# Patient Record
Sex: Male | Born: 1940 | Hispanic: No | Smoking: Current every day smoker
Health system: Southern US, Community
[De-identification: ages and names within clinical notes are randomized; demographics above are authoritative.]

## PROBLEM LIST (undated history)

## (undated) DIAGNOSIS — I10 Essential (primary) hypertension: Secondary | ICD-10-CM

## (undated) DIAGNOSIS — N529 Male erectile dysfunction, unspecified: Secondary | ICD-10-CM

## (undated) HISTORY — PX: COLONOSCOPY: SHX174

## (undated) HISTORY — PX: EYE SURGERY: SHX253

## (undated) HISTORY — DX: Male erectile dysfunction, unspecified: N52.9

## (undated) HISTORY — PX: LUMBAR DISC SURGERY: SHX700

## (undated) HISTORY — DX: Essential (primary) hypertension: I10

---

## 2005-10-22 ENCOUNTER — Ambulatory Visit: Payer: Self-pay | Admitting: Internal Medicine

## 2007-01-28 ENCOUNTER — Ambulatory Visit: Payer: Self-pay | Admitting: Internal Medicine

## 2007-01-28 LAB — CONVERTED CEMR LAB
Albumin: 4.3 g/dL (ref 3.5–5.2)
Alkaline Phosphatase: 58 units/L (ref 39–117)
Bilirubin Urine: NEGATIVE
Creatinine, Ser: 1.1 mg/dL (ref 0.4–1.5)
Eosinophils Absolute: 0.2 10*3/uL (ref 0.0–0.6)
GFR calc Af Amer: 86 mL/min
Glucose, Bld: 115 mg/dL — ABNORMAL HIGH (ref 70–99)
HDL: 32.2 mg/dL — ABNORMAL LOW (ref >39.0)
Hemoglobin: 15.5 g/dL (ref 13.0–17.0)
Hgb A1c MFr Bld: 7.4 % — ABNORMAL HIGH (ref 4.6–6.0)
Leukocytes, UA: NEGATIVE
Lymphocytes Relative: 38.2 % (ref 12.0–46.0)
Monocytes Absolute: 0.5 10*3/uL (ref 0.2–0.7)
Monocytes Relative: 4.3 % (ref 3.0–11.0)
Neutro Abs: 6.7 10*3/uL (ref 1.4–7.7)
Platelets: 270 10*3/uL (ref 150–400)
Potassium: 4.4 meq/L (ref 3.5–5.1)
TSH: 1.94 microintl units/mL (ref 0.35–5.50)
Total Bilirubin: 0.9 mg/dL (ref 0.3–1.2)
Total Protein, Urine: NEGATIVE mg/dL
Total Protein: 7.2 g/dL (ref 6.0–8.3)
VLDL: 48 mg/dL — ABNORMAL HIGH (ref 0–40)
WBC: 12.1 10*3/uL — ABNORMAL HIGH (ref 4.5–10.5)
pH: 6 (ref 5.0–8.0)

## 2007-01-28 IMAGING — CR DG CHEST 2V
2 series · 2 of 2 positions shown · non-contrast
Comparison: Chest of [DATE].

CLINICAL DATA: Syncope.  
 CHEST - 2 VIEW:

[view not recorded (1 of 2)]
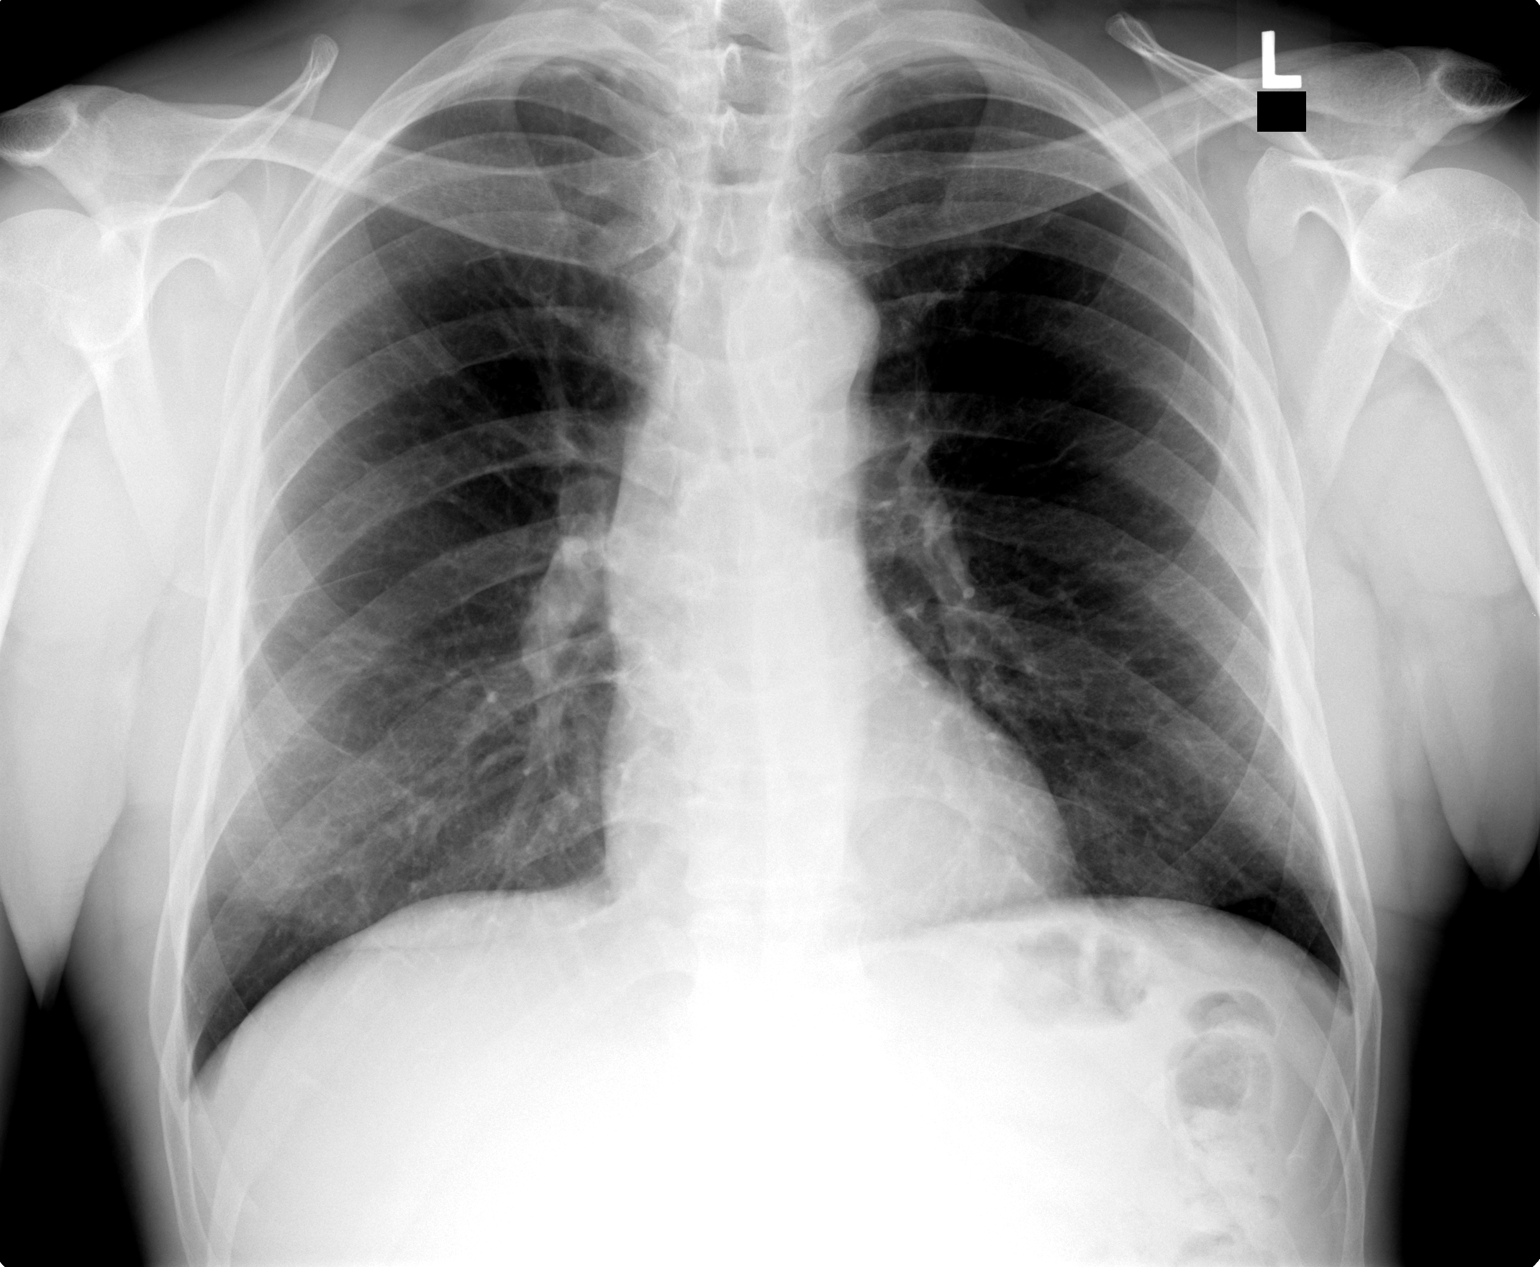

[view not recorded (2 of 2)]
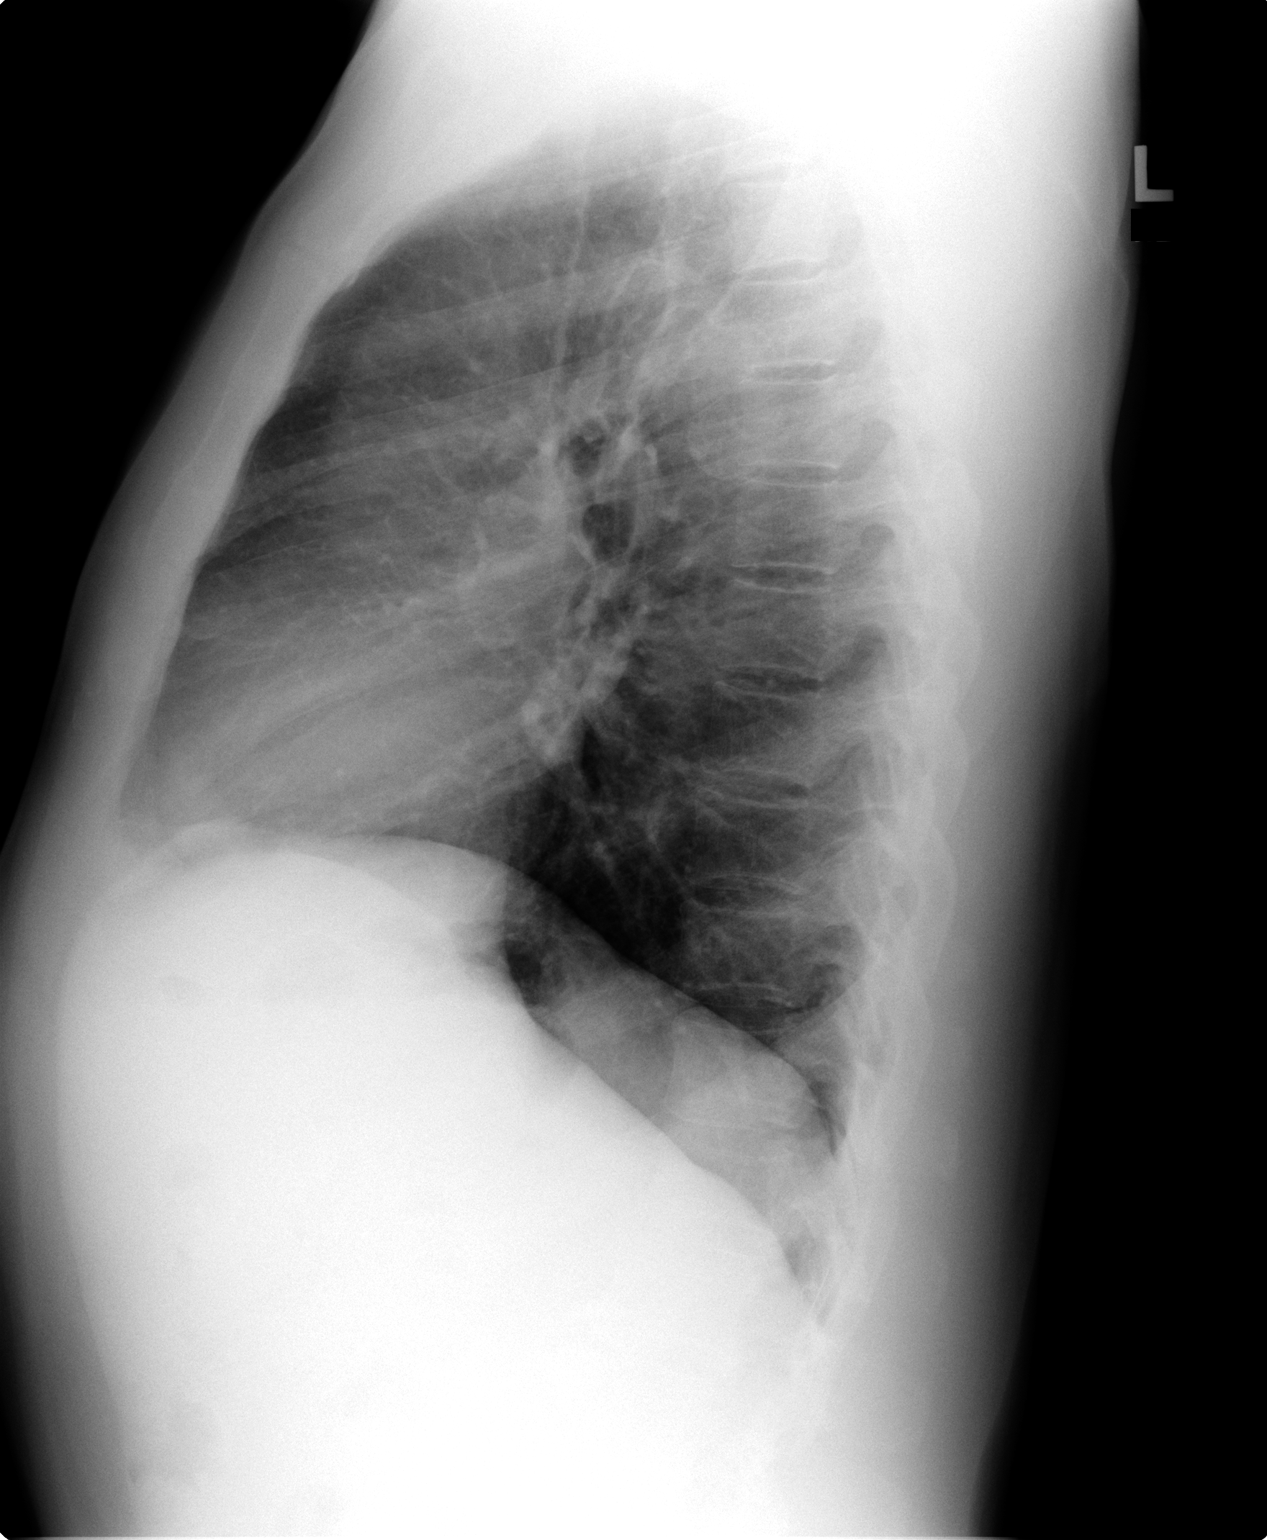

[2 of 2 positions shown; findings below may reference images not displayed]

FINDINGS: Two views of the chest show the lungs to be clear.  The heart is within normal limits in size.  No bony abnormality is seen.
IMPRESSION: No active lung disease.

## 2007-01-29 ENCOUNTER — Encounter: Payer: Self-pay | Admitting: Internal Medicine

## 2007-01-29 LAB — CONVERTED CEMR LAB: Vit D, 1,25-Dihydroxy: 22 (ref 20–57)

## 2007-02-23 ENCOUNTER — Ambulatory Visit: Payer: Self-pay | Admitting: Cardiovascular Disease

## 2007-03-10 ENCOUNTER — Ambulatory Visit: Payer: Self-pay

## 2007-03-10 ENCOUNTER — Encounter: Payer: Self-pay | Admitting: Cardiovascular Disease

## 2007-03-12 ENCOUNTER — Ambulatory Visit: Payer: Self-pay | Admitting: Internal Medicine

## 2007-03-26 ENCOUNTER — Ambulatory Visit: Payer: Self-pay | Admitting: Internal Medicine

## 2007-03-26 ENCOUNTER — Encounter: Payer: Self-pay | Admitting: Internal Medicine

## 2007-05-08 ENCOUNTER — Encounter: Payer: Self-pay | Admitting: Internal Medicine

## 2007-05-08 DIAGNOSIS — M109 Gout, unspecified: Secondary | ICD-10-CM

## 2007-05-15 ENCOUNTER — Ambulatory Visit: Payer: Self-pay | Admitting: Internal Medicine

## 2008-01-29 ENCOUNTER — Ambulatory Visit: Payer: Self-pay | Admitting: Internal Medicine

## 2008-01-29 DIAGNOSIS — F17200 Nicotine dependence, unspecified, uncomplicated: Secondary | ICD-10-CM | POA: Insufficient documentation

## 2008-01-29 DIAGNOSIS — E559 Vitamin D deficiency, unspecified: Secondary | ICD-10-CM

## 2008-01-29 DIAGNOSIS — R21 Rash and other nonspecific skin eruption: Secondary | ICD-10-CM

## 2008-01-29 DIAGNOSIS — N529 Male erectile dysfunction, unspecified: Secondary | ICD-10-CM | POA: Insufficient documentation

## 2008-08-15 ENCOUNTER — Ambulatory Visit: Payer: Self-pay | Admitting: Internal Medicine

## 2008-08-15 DIAGNOSIS — I10 Essential (primary) hypertension: Secondary | ICD-10-CM

## 2010-02-08 ENCOUNTER — Ambulatory Visit: Payer: Self-pay | Admitting: Internal Medicine

## 2010-02-08 DIAGNOSIS — R059 Cough, unspecified: Secondary | ICD-10-CM | POA: Insufficient documentation

## 2010-02-08 DIAGNOSIS — R05 Cough: Secondary | ICD-10-CM

## 2010-02-08 IMAGING — CR DG CHEST 2V
2 series · 2 of 2 positions shown · non-contrast
Comparison: Chest x-ray of [DATE]

CLINICAL DATA: Cough, smoking history

CHEST - 2 VIEW

[view not recorded (1 of 2)]
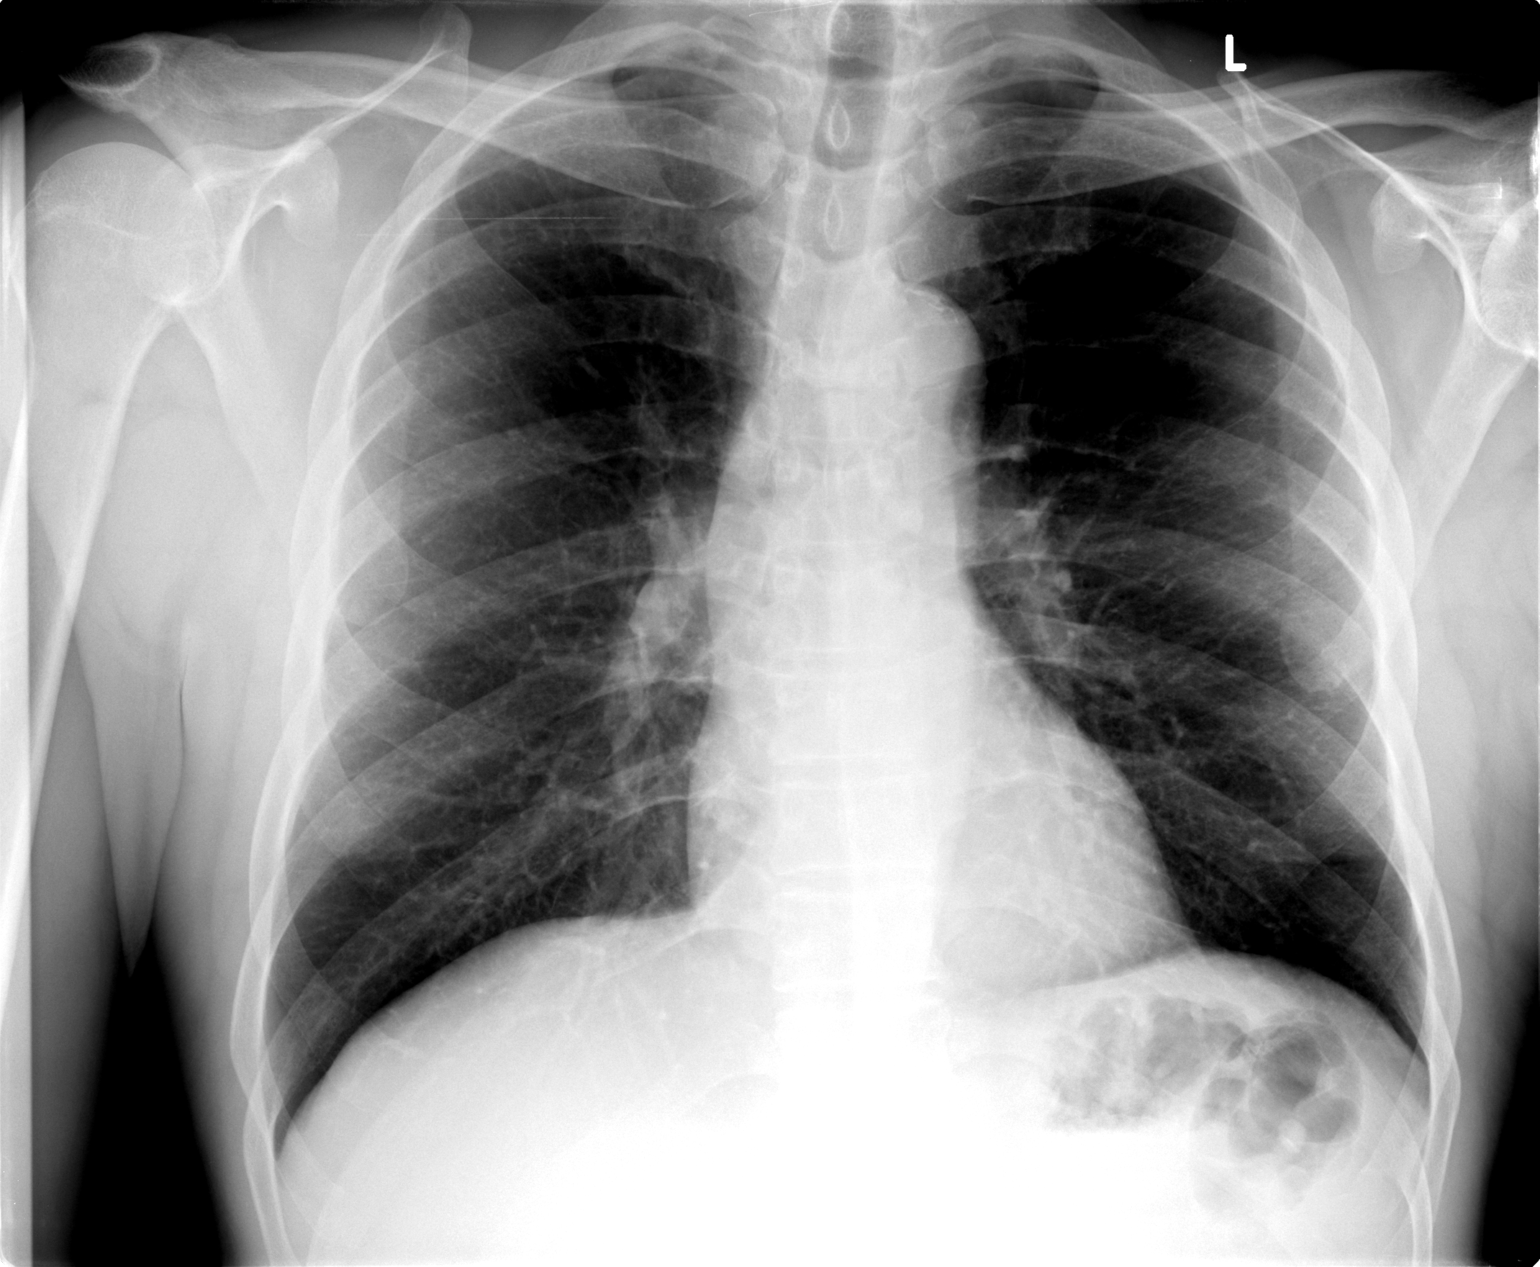

[view not recorded (2 of 2)]
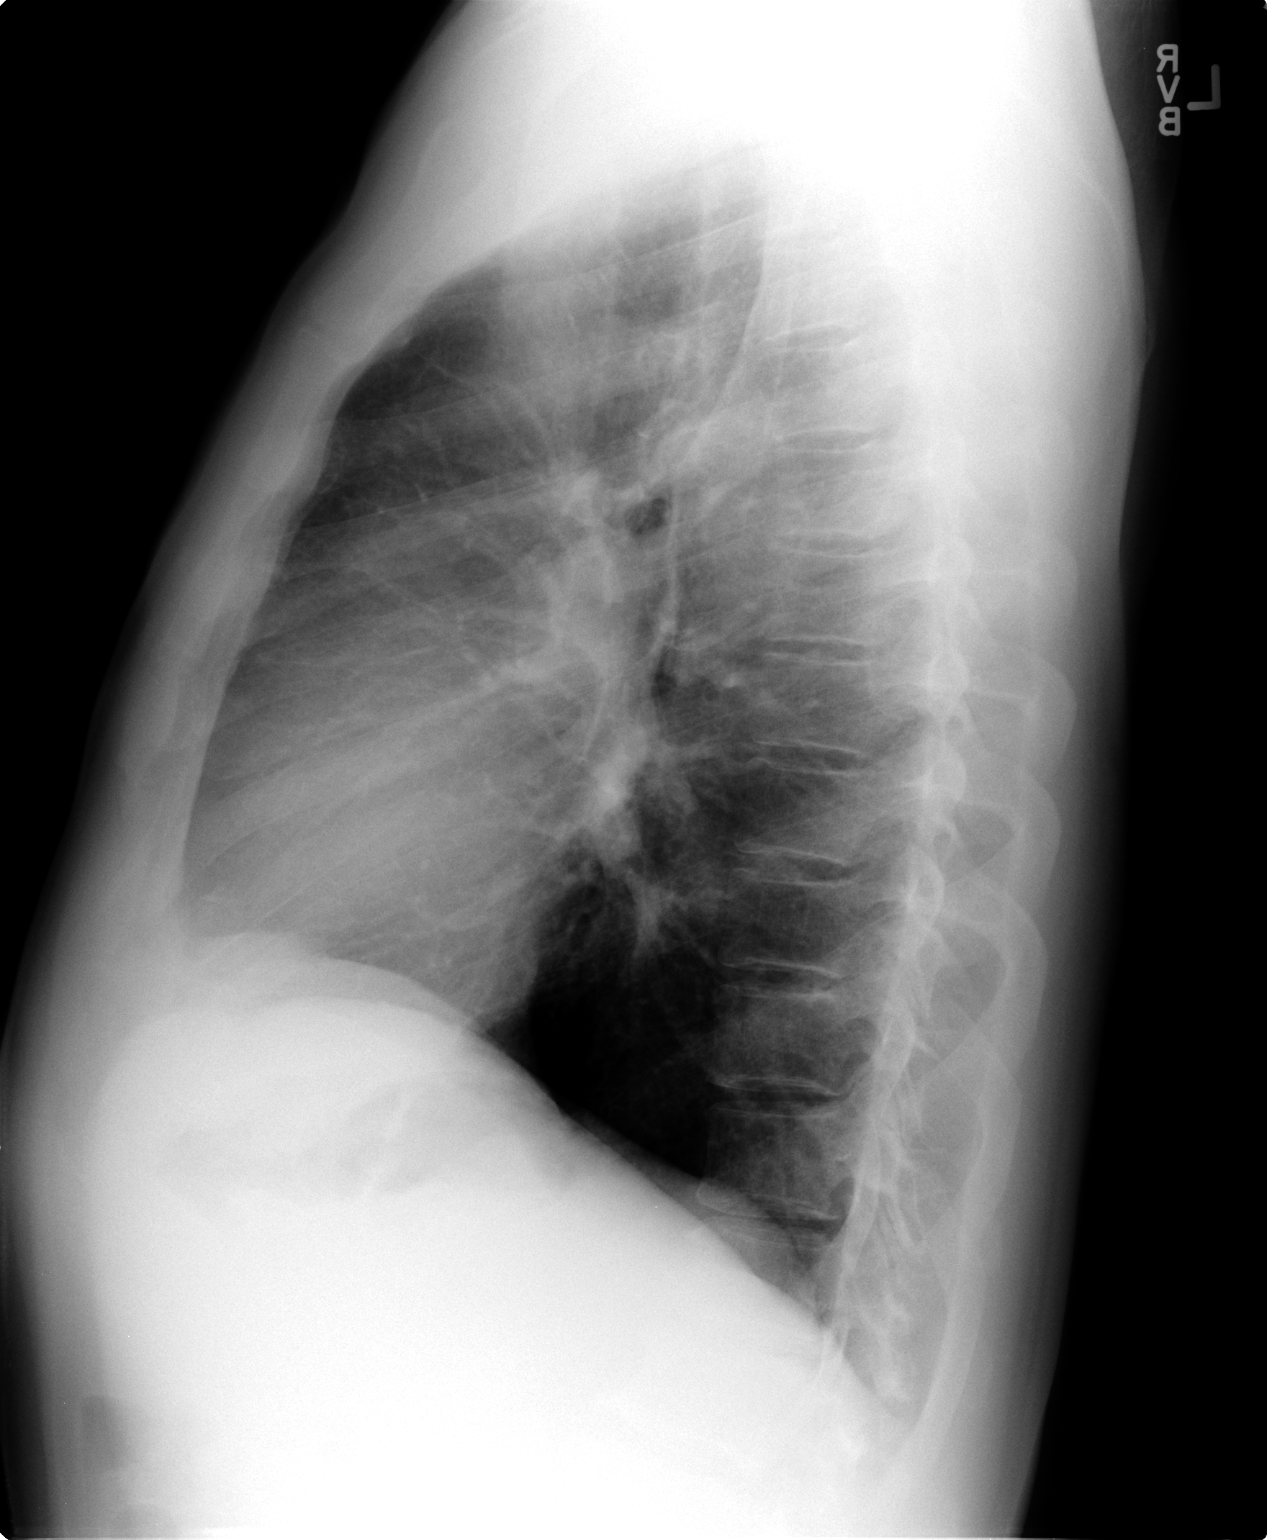

[2 of 2 positions shown; findings below may reference images not displayed]

FINDINGS: The lungs remain clear.  Mediastinal contours are
stable.  The heart is within normal limits in size.  No bony
abnormality is seen.
IMPRESSION: No active lung disease.

## 2010-07-31 ENCOUNTER — Ambulatory Visit: Payer: Self-pay | Admitting: Internal Medicine

## 2010-09-30 LAB — CONVERTED CEMR LAB
AST: 18 units/L (ref 0–37)
Albumin: 3.9 g/dL (ref 3.5–5.2)
Alkaline Phosphatase: 56 units/L (ref 39–117)
Alkaline Phosphatase: 65 units/L (ref 39–117)
BUN: 13 mg/dL (ref 6–23)
Basophils Absolute: 0 10*3/uL (ref 0.0–0.1)
Basophils Relative: 0 % (ref 0.0–3.0)
Bilirubin Urine: NEGATIVE
Bilirubin, Direct: 0.1 mg/dL (ref 0.0–0.3)
CO2: 29 meq/L (ref 19–32)
Calcium: 9.4 mg/dL (ref 8.4–10.5)
Calcium: 9.7 mg/dL (ref 8.4–10.5)
Chloride: 104 meq/L (ref 96–112)
Chloride: 105 meq/L (ref 96–112)
Creatinine, Ser: 1.1 mg/dL (ref 0.4–1.5)
Eosinophils Absolute: 0.2 10*3/uL (ref 0.0–0.7)
Eosinophils Relative: 1.6 % (ref 0.0–5.0)
Eosinophils Relative: 2.9 % (ref 0.0–5.0)
GFR calc Af Amer: 86 mL/min
GFR calc non Af Amer: 90.19 mL/min (ref >60)
Hemoglobin: 15.1 g/dL (ref 13.0–17.0)
Ketones, ur: NEGATIVE mg/dL
Ketones, ur: NEGATIVE mg/dL
Lymphocytes Relative: 33.9 % (ref 12.0–46.0)
Lymphs Abs: 3.9 10*3/uL (ref 0.7–4.0)
MCV: 98.2 fL (ref 78.0–100.0)
Monocytes Absolute: 0.4 10*3/uL (ref 0.1–1.0)
Monocytes Absolute: 0.4 10*3/uL (ref 0.1–1.0)
Monocytes Relative: 3.9 % (ref 3.0–12.0)
Monocytes Relative: 4.6 % (ref 3.0–12.0)
Neutro Abs: 6.1 10*3/uL (ref 1.4–7.7)
Neutrophils Relative %: 51.7 % (ref 43.0–77.0)
Potassium: 4.9 meq/L (ref 3.5–5.1)
RBC: 4.28 M/uL (ref 4.22–5.81)
RBC: 4.45 M/uL (ref 4.22–5.81)
RDW: 12.8 % (ref 11.5–14.6)
RDW: 14.4 % (ref 11.5–14.6)
Sodium: 143 meq/L (ref 135–145)
Specific Gravity, Urine: 1.02 (ref 1.000–1.03)
Specific Gravity, Urine: <=1.005 (ref 1.000–1.030)
Total Bilirubin: 0.6 mg/dL (ref 0.3–1.2)
Total CHOL/HDL Ratio: 5.2
Total Protein, Urine: NEGATIVE mg/dL
Total Protein, Urine: NEGATIVE mg/dL
Total Protein: 7.1 g/dL (ref 6.0–8.3)
Uric Acid, Serum: 6.6 mg/dL (ref 4.0–7.8)
Uric Acid, Serum: 8.7 mg/dL — ABNORMAL HIGH (ref 4.0–7.8)
Urine Glucose: NEGATIVE mg/dL
Urine Glucose: NEGATIVE mg/dL
Urobilinogen, UA: 0.2 (ref 0.0–1.0)
Urobilinogen, UA: 0.2 (ref 0.0–1.0)
WBC: 10.2 10*3/uL (ref 4.5–10.5)
WBC: 9.5 10*3/uL (ref 4.5–10.5)
pH: 6 (ref 5.0–8.0)

## 2010-10-02 NOTE — Assessment & Plan Note (Signed)
Summary: cpx/medicare/#/cd   Vital Signs:  Patient profile:   70 year old male Height:      72 inches Weight:      167.25 pounds BMI:     22.77 O2 Sat:      97 % on Room air Temp:     98.4 degrees F oral Pulse rate:   73 / minute BP sitting:   130 / 80  (left arm) Cuff size:   regular  Vitals Entered By: Lucious Groves (February 08, 2010 11:02 AM)  O2 Flow:  Room air CC: CPX./kb Is Patient Diabetic? No Pain Assessment Patient in pain? no      Comments Patient notes that all meds need refill./kb   CC:  CPX./kb.  History of Present Illness: The patient presents for a wellness examination  Patient past medical history, social history, and family history reviewed in detail no significant changes.  Patient is physically active. Depression is negative and mood is good. Hearing is normal, and able to perform activities of daily living. Risk of falling is negligible and home safety has been reviewed and is appropriate. Patient has normal height, weight, and visual acuity. Patient has been counseled on age-appropriate routine health concerns for screening and prevention. Education, counseling,done. F/u HTN, ED, gout. Gout has been flaring up after beer drinking. BP has been OK - he has been out of his meds x weeks. ED has been a problem. Wife has been ill a lot.  Current Medications (verified): 1)  Indomethacin Cr 75 Mg  Cpcr (Indomethacin) .Marland Kitchen.. 1 Po Bid As Needed 2)  Triamcinolone Acetonide 0.5 % Crea (Triamcinolone Acetonide) .... Apply Bid To Affected Area 3)  Colchicine 0.6 Mg Tabs (Colchicine) .Marland Kitchen.. 1 By Mouth Qid As Needed Gout 4)  Amlodipine Besylate 5 Mg  Tabs (Amlodipine Besylate) .Marland Kitchen.. 1 By Mouth Every Day 5)  Vitamin D3 1000 Unit  Tabs (Cholecalciferol) .Marland Kitchen.. 1 By Mouth Daily 6)  Viagra 100 Mg Tabs (Sildenafil Citrate) .Marland Kitchen.. 1 By Mouth Once Daily Prn  Allergies (verified): No Known Drug Allergies  Past History:  Family History: Last updated: 01/29/2008 No CAD  Social  History: Last updated: 08/15/2008 Occupation:hotel maint part time Married wife Doris Current Smoker  Past Medical History: Reviewed history from 08/15/2008 and no changes required. Gout Vit D def ED Hypertension  Past Surgical History: Denies surgical history  Review of Systems  The patient denies anorexia, fever, weight loss, weight gain, vision loss, decreased hearing, hoarseness, chest pain, syncope, dyspnea on exertion, peripheral edema, prolonged cough, headaches, hemoptysis, abdominal pain, melena, hematochezia, severe indigestion/heartburn, hematuria, incontinence, genital sores, muscle weakness, suspicious skin lesions, transient blindness, difficulty walking, depression, unusual weight change, abnormal bleeding, enlarged lymph nodes, angioedema, and breast masses.    Physical Exam  General:  Well-developed,well-nourished,in no acute distress; alert,appropriate and cooperative throughout examination Head:  Normocephalic and atraumatic without obvious abnormalities. No apparent alopecia or balding. Eyes:  No corneal or conjunctival inflammation noted. EOMI. Perrla. Ears:  External ear exam shows no significant lesions or deformities.  Otoscopic examination reveals clear canals, tympanic membranes are intact bilaterally without bulging, retraction, inflammation or discharge. Hearing is grossly normal bilaterally. Nose:  External nasal examination shows no deformity or inflammation. Nasal mucosa are pink and moist without lesions or exudates. Mouth:  Oral mucosa and oropharynx without lesions or exudates.  Teeth in poor repair. Neck:  No deformities, masses, or tenderness noted. Lungs:  Normal respiratory effort, chest expands symmetrically. Lungs are clear to auscultation, no crackles  or wheezes. Heart:  Normal rate and regular rhythm. S1 and S2 normal without gallop, murmur, click, rub or other extra sounds. Abdomen:  Bowel sounds positive,abdomen soft and non-tender without  masses, organomegaly or hernias noted. Rectal:  No external abnormalities noted. Normal sphincter tone. No rectal masses or tenderness. G(-) Genitalia:  WNL Prostate:  no nodules and 1+ enlarged.   Msk:  No deformity or scoliosis noted of thoracic or lumbar spine.   Pulses:  R and L carotid,radial,femoral,dorsalis pedis and posterior tibial pulses are full and equal bilaterally Extremities:  No clubbing, cyanosis, edema, or deformity noted with normal full range of motion of all joints.   Neurologic:  No cranial nerve deficits noted. Station and gait are normal. Plantar reflexes are down-going bilaterally. DTRs are symmetrical throughout. Sensory, motor and coordinative functions appear intact. Skin:  Intact without suspicious lesions or rashes Cervical Nodes:  No lymphadenopathy noted Inguinal Nodes:  No significant adenopathy Psych:  Cognition and judgment appear intact. Alert and cooperative with normal attention span and concentration. No apparent delusions, illusions, hallucinations   Impression & Recommendations:  Problem # 1:  WELL ADULT EXAM (ICD-V70.0) Assessment New Overall doing well, age appropriate education and counseling updated and referral for appropriate preventive services done unless declined, immunizations up to date or declined, diet counseling done if overweight, urged to quit smoking if smokes, most recent labs reviewed and current ordered if appropriate, ecg reviewed or declined (interpretation per ECG scanned in the EMR if done); information regarding Medicare Preventation requirements given if appropriate.  Orders: EKG w/ Interpretation (93000) TLB-BMP (Basic Metabolic Panel-BMET) (80048-METABOL) TLB-CBC Platelet - w/Differential (85025-CBCD) TLB-Hepatic/Liver Function Pnl (80076-HEPATIC) TLB-TSH (Thyroid Stimulating Hormone) (84443-TSH) TLB-PSA (Prostate Specific Antigen) (84153-PSA) TLB-Udip ONLY (81003-UDIP) First annual wellness visit with prevention plan   (Z6109)  Problem # 2:  GOUT (ICD-274.9) Assessment: Improved  His updated medication list for this problem includes:    Colchicine 0.6 Mg Tabs (Colchicine) .Marland Kitchen... 1 by mouth qid as needed gout  Orders: TLB-Uric Acid, Blood (84550-URIC)  Problem # 3:  HYPERTENSION (ICD-401.9) Assessment: Improved  His updated medication list for this problem includes:    Amlodipine Besylate 5 Mg Tabs (Amlodipine besylate) .Marland Kitchen... 1 by mouth every day  BP today: 130/80 Prior BP: 154/100 (08/15/2008)  Labs Reviewed: K+: 4.7 (08/15/2008) Creat: : 1.1 (08/15/2008)   Chol: 201 (08/15/2008)   HDL: 38.6 (08/15/2008)   LDL: DEL (08/15/2008)   TG: 130 (08/15/2008)  Problem # 4:  VITAMIN D DEFICIENCY (ICD-268.9) Assessment: Improved On Rx  Problem # 5:  TOBACCO USE DISORDER/SMOKER-SMOKING CESSATION DISCUSSED (ICD-305.1) Assessment: Unchanged  Encouraged smoking cessation and discussed different methods for smoking cessation.   Problem # 6:  ERECTILE DYSFUNCTION (ICD-607.84) Assessment: Unchanged  His updated medication list for this problem includes:    Viagra 100 Mg Tabs (Sildenafil citrate) .Marland Kitchen... 1 by mouth once daily prn  Complete Medication List: 1)  Triamcinolone Acetonide 0.5 % Crea (Triamcinolone acetonide) .... Apply bid to affected area 2)  Colchicine 0.6 Mg Tabs (Colchicine) .Marland Kitchen.. 1 by mouth qid as needed gout 3)  Amlodipine Besylate 5 Mg Tabs (Amlodipine besylate) .Marland Kitchen.. 1 by mouth every day 4)  Vitamin D3 1000 Unit Tabs (Cholecalciferol) .Marland Kitchen.. 1 by mouth daily 5)  Viagra 100 Mg Tabs (Sildenafil citrate) .Marland Kitchen.. 1 by mouth once daily prn 6)  Indomethacin 50 Mg Caps (Indomethacin) .Marland Kitchen.. 1 by mouth three times a day as needed gout 7)  Vitamin D 1000 Unit Tabs (Cholecalciferol) .Marland Kitchen.. 1 by mouth qd  8)  Hydrocodone-acetaminophen 5-325 Mg Tabs (Hydrocodone-acetaminophen) .Marland Kitchen.. 1-2 by mouth two times a day as needed pain  Other Orders: T-2 View CXR, Same Day (71020.5TC)  Patient Instructions: 1)   Please schedule a follow-up appointment in 1 year. Prescriptions: VIAGRA 100 MG TABS (SILDENAFIL CITRATE) 1 by mouth once daily prn  #12 x 6   Entered and Authorized by:   Tresa Garter MD   Signed by:   Tresa Garter MD on 02/08/2010   Method used:   Print then Give to Patient   RxID:   [id number] AMLODIPINE BESYLATE 5 MG  TABS (AMLODIPINE BESYLATE) 1 by mouth every day  #90 x 3   Entered and Authorized by:   Tresa Garter MD   Signed by:   Tresa Garter MD on 02/08/2010   Method used:   Print then Give to Patient   RxID:   [id number] HYDROCODONE-ACETAMINOPHEN 5-325 MG TABS (HYDROCODONE-ACETAMINOPHEN) 1-2 by mouth two times a day as needed pain  #30 x 1   Entered and Authorized by:   Tresa Garter MD   Signed by:   Tresa Garter MD on 02/08/2010   Method used:   Print then Give to Patient   RxID:   [id number] INDOMETHACIN 50 MG CAPS (INDOMETHACIN) 1 by mouth three times a day as needed gout  #60 x 3   Entered and Authorized by:   Tresa Garter MD   Signed by:   Tresa Garter MD on 02/08/2010   Method used:   Print then Give to Patient   RxID:   [id number]

## 2010-10-02 NOTE — Assessment & Plan Note (Signed)
Summary: FLU SHOT Natale Milch   Nurse Visit   Allergies: No Known Drug Allergies  Orders Added: 1)  Flu Vaccine 25yrs + MEDICARE PATIENTS [Q2039] 2)  Administration Flu vaccine - MCR [G0008]            Flu Vaccine Consent Questions     Do you have a history of severe allergic reactions to this vaccine? no    Any prior history of allergic reactions to egg and/or gelatin? no    Do you have a sensitivity to the preservative Thimersol? no    Do you have a past history of Guillan-Barre Syndrome? no    Do you currently have an acute febrile illness? no    Have you ever had a severe reaction to latex? no    Vaccine information given and explained to patient? yes    Are you currently pregnant? no    Lot Number:[id number]   Exp Date:03/02/2011   Site Given  Left Deltoid IM Lanier Prude, [facility])  July 31, 2010 3:58 PM

## 2010-10-30 ENCOUNTER — Telehealth: Payer: Self-pay | Admitting: Internal Medicine

## 2010-11-08 NOTE — Progress Notes (Signed)
  Phone Note Call from Patient   Summary of Call: needs rx Initial call taken by: Tresa Garter MD,  October 30, 2010 4:33 PM  Follow-up for Phone Call        ok Follow-up by: Tresa Garter MD,  October 30, 2010 4:34 PM    Prescriptions: HYDROCODONE-ACETAMINOPHEN 5-325 MG TABS (HYDROCODONE-ACETAMINOPHEN) 1-2 by mouth two times a day as needed pain  #30 x 1   Entered and Authorized by:   Tresa Garter MD   Signed by:   Tresa Garter MD on 10/30/2010   Method used:   Print then Give to Patient   RxID:   [id number] INDOMETHACIN 50 MG CAPS (INDOMETHACIN) 1 by mouth three times a day as needed gout  #60 x 3   Entered and Authorized by:   Tresa Garter MD   Signed by:   Tresa Garter MD on 10/30/2010   Method used:   Print then Give to Patient   RxID:   [id number]

## 2011-01-15 NOTE — Assessment & Plan Note (Signed)
[facility]                            CARDIOLOGY OFFICE NOTE   Brent Turner, Brent Turner                         MRN:          [id number]  DATE:02/23/2007                            DOB:          Apr 21, 1941    [name] is seen today at the request of Dr. Posey Turner.  He had an  episode of presyncope a year ago.   I am not sure why it took the patient so long to follow up.  He has not  had any recurrences.  He has no documented coronary disease or previous  cardiac problem.  His coronary risk factors include smoking.   About a year ago he recalls getting out of his car.  It was air  conditioned.  It was very hot that day.  He felt light-headed.  It  resolved spontaneously.  There were no palpitations, no chest pain, no  PND, or orthopnea.   The patient did not have any vertigo at the time.  The occurrence was  isolated and has not recurred.   The patient has been smoking a pack a day since his childhood.   There is no family history of coronary disease.  There is no  hypertension.  There is no diabetes.  His cholesterol status is unknown  to me.   REVIEW OF SYSTEMS:  Otherwise remarkable for some arthritis.   FAMILY HISTORY:  The patient's family history is remarkable for mother  having a stroke and dying at age 30, father dying of cancer at age 65.   SOCIAL HISTORY:  He is retired.  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  He is married.  His wife is actually a patient of Dr.  Ludwig Turner so she has had a stent.  He has two older children who are  doing well.   He has limited hobbies.   He is active and has primary limitations from is arthritis.   MEDICATIONS:  1. Indomethacin 25 a day.  2. Allopurinol 300 a day.  3. Viagra p.r.n.   He has a glass of wine 2-3 times a week.   PAST SURGICAL HISTORY:  Otherwise unremarkable.   PHYSICAL EXAMINATION:  GENERAL:  Thin, elderly, black male in no  distress.  He looks his stated  age.  Affect is good.  He does have  nicotine on the breath.  VITAL SIGNS:  Blood pressure is 140/60.  He is not postural.  Pulse is  88 and regular.  He is afebrile.  Respiratory rate is 14.  HEENT:  Unremarkable.  NECK:  Carotids are without bruit.  There is no JVP elevation.  No  lymphadenopathy.  No thyromegaly.  LUNGS:  Clear with normal diaphragmatic motion.  No wheezing.  HEART:  There is an S1/S2, normal heart sounds.  PMI is normal.  ABDOMEN:  Benign.  Bowel sounds are positive.  There is no tenderness.  No hepatosplenomegaly or hepatojugular reflux.  No AAA.  No renal  bruits.  EXTREMITIES:  Femorals are +4 bilaterally without bruit.  PTs are +3.  There is no  lower extremity edema.  NEUROLOGIC:  Nonfocal.  There is no muscular weakness.   LABORATORY DATA:  His EKG is normal.   IMPRESSION:  21. A 70 year old with isolated episode of presyncope a year ago.  This      in and of itself would not need any follow-up.  I suspect it was      heat-related with acute vasodilatation going from an air      conditioned car to the outdoor heat.  2. I think that the patient probably should have a baseline      echocardiogram, given his episode of presyncope and age.  We will      do this to rule out any structural heart disease.  3. Given his age and the fact that he has smoked for such a long time,      he will be referred for a stress Myoview to rule out coronary      artery disease.  4. Smoking.  I talked to the patient for less than 10 minutes      regarding smoking cessation.  He does not seem very motivated to      quit.  He refuses Chantix at this time.  He will talk to Dr.      Posey Turner further about it as needed.  I explained to him the risks      of cancer as well as coronary artery disease.  He said that Dr.      Posey Turner has done a chest x-ray on a yearly basis and there have      been no lesions   I will see him on a p.r.n. basis if his echo and Myoview are low  risk.     Noralyn Pick. Eden Emms, MD, [facility]  Electronically Signed    PCN/MedQ  DD: 02/23/2007  DT: 02/23/2007  Job #: [id number]   cc:   Brent Quint. [name], MD

## 2011-04-09 ENCOUNTER — Other Ambulatory Visit: Payer: Self-pay | Admitting: *Deleted

## 2011-04-09 MED ORDER — HYDROCODONE-ACETAMINOPHEN 5-325 MG PO TABS: 1.0000 | ORAL_TABLET | Freq: Two times a day (BID) | ORAL | Status: AC | PRN

## 2011-04-09 MED ORDER — AMLODIPINE BESYLATE 5 MG PO TABS: 5.0000 mg | ORAL_TABLET | Freq: Every day | ORAL | Status: DC

## 2011-04-09 MED ORDER — INDOMETHACIN 50 MG PO CAPS: 50.0000 mg | ORAL_CAPSULE | Freq: Three times a day (TID) | ORAL | Status: DC

## 2011-04-09 NOTE — Progress Notes (Signed)
OK RFs per MD

## 2011-05-14 ENCOUNTER — Ambulatory Visit (INDEPENDENT_AMBULATORY_CARE_PROVIDER_SITE_OTHER): Payer: Medicare Other | Admitting: Internal Medicine

## 2011-05-14 ENCOUNTER — Encounter: Payer: Self-pay | Admitting: Internal Medicine

## 2011-05-14 VITALS — BP 120/72 | HR 88 | Temp 98.6°F | Resp 16 | Wt 156.0 lb

## 2011-05-14 DIAGNOSIS — R634 Abnormal weight loss: Secondary | ICD-10-CM

## 2011-05-14 DIAGNOSIS — E559 Vitamin D deficiency, unspecified: Secondary | ICD-10-CM

## 2011-05-14 DIAGNOSIS — I10 Essential (primary) hypertension: Secondary | ICD-10-CM

## 2011-05-14 DIAGNOSIS — Z0000 Encounter for general adult medical examination without abnormal findings: Secondary | ICD-10-CM

## 2011-05-14 DIAGNOSIS — Z136 Encounter for screening for cardiovascular disorders: Secondary | ICD-10-CM

## 2011-05-14 DIAGNOSIS — M109 Gout, unspecified: Secondary | ICD-10-CM

## 2011-05-14 DIAGNOSIS — Z23 Encounter for immunization: Secondary | ICD-10-CM

## 2011-05-14 NOTE — Progress Notes (Signed)
  Subjective:    Patient ID: Brent Turner, male    DOB: 04-Nov-1940, 70 y.o.   MRN: [id number]  HPI  The patient is here for a wellness exam. The patient has been doing well overall without major physical or psychological issues going on lately, however, he lost wt. The patient needs to address  chronic hypertension that has been well controlled with medicines, goutReview of Systems  Constitutional: Negative for appetite change, fatigue and unexpected weight change.  HENT: Negative for nosebleeds, congestion, sore throat, sneezing, trouble swallowing and neck pain.   Eyes: Negative for itching and visual disturbance.  Respiratory: Negative for cough.   Cardiovascular: Negative for chest pain, palpitations and leg swelling.  Gastrointestinal: Negative for nausea, diarrhea, blood in stool and abdominal distention.  Genitourinary: Negative for frequency and hematuria.  Musculoskeletal: Negative for back pain, joint swelling and gait problem.  Skin: Negative for rash.  Neurological: Negative for dizziness, tremors, speech difficulty and weakness.  Psychiatric/Behavioral: Negative for sleep disturbance, dysphoric mood and agitation. The patient is not nervous/anxious.        Objective:   Physical Exam  Constitutional: He is oriented to person, place, and time. He appears well-developed and well-nourished. No distress.  HENT:  Head: Normocephalic and atraumatic.  Right Ear: External ear normal.  Left Ear: External ear normal.  Nose: Nose normal.  Mouth/Throat: Oropharynx is clear and moist. No oropharyngeal exudate.  Eyes: Conjunctivae and EOM are normal. Pupils are equal, round, and reactive to light. Right eye exhibits no discharge. Left eye exhibits no discharge. No scleral icterus.  Neck: Normal range of motion. Neck supple. No JVD present. No tracheal deviation present. No thyromegaly present.  Cardiovascular: Normal rate, regular rhythm, normal heart sounds and intact distal pulses.   Exam reveals no gallop and no friction rub.   No murmur heard. Pulmonary/Chest: Effort normal and breath sounds normal. No stridor. No respiratory distress. He has no wheezes. He has no rales. He exhibits no tenderness.  Abdominal: Soft. Bowel sounds are normal. He exhibits no distension and no mass. There is no tenderness. There is no rebound and no guarding.  Genitourinary: Rectum normal, prostate normal and penis normal. Guaiac negative stool. No penile tenderness.  Musculoskeletal: Normal range of motion. He exhibits no edema and no tenderness.  Lymphadenopathy:    He has no cervical adenopathy.  Neurological: He is alert and oriented to person, place, and time. He has normal reflexes. No cranial nerve deficit. He exhibits normal muscle tone. Coordination normal.  Skin: Skin is warm and dry. No rash noted. He is not diaphoretic. No erythema. No pallor.  Psychiatric: He has a normal mood and affect. His behavior is normal. Judgment and thought content normal.     Wt Readings from Last 3 Encounters:  05/14/11 156 lb (70.761 kg)  02/08/10 167 lb 4 oz (75.864 kg)  08/15/08 169 lb (76.658 kg)        Assessment & Plan:    Wt loss - Most likely due to poor intake after teeth were pulled. Try Boost

## 2011-05-14 NOTE — Patient Instructions (Signed)
Boost 1-2 a day

## 2011-05-14 NOTE — Assessment & Plan Note (Signed)
Continue with current prescription therapy as reflected on the Med list.  

## 2011-05-14 NOTE — Assessment & Plan Note (Signed)
Risks associated with treatment noncompliance were discussed. Compliance was encouraged. Restart Rx  

## 2011-05-14 NOTE — Assessment & Plan Note (Signed)
The patient is here for annual Medicare wellness examination and management of other chronic and acute problems.   The risk factors are reflected in the social history.  The roster of all physicians providing medical care to patient - is listed in the Snapshot section of the chart.  Activities of daily living:  The patient is 100% inedpendent in all ADLs: dressing, toileting, feeding as well as independent mobility  Home safety : The patient has smoke detectors in the home. They wear seatbelts.No firearms at home ( firearms are present in the home, kept in a safe fashion). There is no violence in the home.   There is no risks for hepatitis, STDs or HIV. There is no   history of blood transfusion. They have no travel history to infectious disease endemic areas of the world.  The patient has (has not) seen their dentist in the last six month. They have (not) seen their eye doctor in the last year. They deny (admit to) any hearing difficulty and have not had audiologic testing in the last year.  They do not  have excessive sun exposure. Discussed the need for sun protection: hats, long sleeves and use of sunscreen if there is significant sun exposure.   Diet: the importance of a healthy diet is discussed. They do have a healthy (unhealthy-high fat/fast food) diet.  The patient has no regular exercise program: active physically, however.  The benefits of regular aerobic exercise were discussed.  Depression screen: there are no signs or vegative symptoms of depression- irritability, change in appetite, anhedonia, sadness/tearfullness.  Cognitive assessment: the patient manages all their financial and personal affairs and is actively engaged. They could relate day,date,year and events; recalled 3/3 objects at 3 minutes; performed clock-face test normally.  The following portions of the patient's history were reviewed and updated as appropriate: allergies, current medications, past family history,  past medical history,  past surgical history, past social history  and problem list.  Vision, hearing, body mass index were assessed and reviewed.   During the course of the visit the patient was educated and counseled about appropriate screening and preventive services including : fall prevention , diabetes screening, nutrition counseling, colorectal cancer screening, and recommended immunizations.  

## 2011-11-02 ENCOUNTER — Other Ambulatory Visit: Payer: Self-pay | Admitting: Internal Medicine

## 2011-11-11 ENCOUNTER — Other Ambulatory Visit: Payer: Self-pay | Admitting: Internal Medicine

## 2011-11-12 ENCOUNTER — Telehealth: Payer: Self-pay | Admitting: *Deleted

## 2011-11-12 NOTE — Telephone Encounter (Signed)
Rf req for Hydroco/APAP 5-325 1-2 po bid prn pain. Ok to Rf?

## 2011-11-12 NOTE — Telephone Encounter (Signed)
OK to fill this prescription with additional refills x0 Thank you!  

## 2011-11-14 NOTE — Telephone Encounter (Signed)
Rx called to pharmacist #[phone] x no refills as this was the previous quantity prescribed.

## 2012-06-02 ENCOUNTER — Other Ambulatory Visit: Payer: Self-pay | Admitting: Internal Medicine

## 2012-07-13 ENCOUNTER — Ambulatory Visit (INDEPENDENT_AMBULATORY_CARE_PROVIDER_SITE_OTHER): Payer: Medicare Other | Admitting: Internal Medicine

## 2012-07-13 ENCOUNTER — Encounter: Payer: Self-pay | Admitting: Internal Medicine

## 2012-07-13 ENCOUNTER — Other Ambulatory Visit (INDEPENDENT_AMBULATORY_CARE_PROVIDER_SITE_OTHER): Payer: Medicare Other

## 2012-07-13 VITALS — BP 142/80 | HR 76 | Temp 97.9°F | Resp 16 | Ht 72.0 in | Wt 155.0 lb

## 2012-07-13 DIAGNOSIS — Z23 Encounter for immunization: Secondary | ICD-10-CM

## 2012-07-13 DIAGNOSIS — Z136 Encounter for screening for cardiovascular disorders: Secondary | ICD-10-CM

## 2012-07-13 DIAGNOSIS — Z0000 Encounter for general adult medical examination without abnormal findings: Secondary | ICD-10-CM

## 2012-07-13 DIAGNOSIS — E559 Vitamin D deficiency, unspecified: Secondary | ICD-10-CM

## 2012-07-13 DIAGNOSIS — F17200 Nicotine dependence, unspecified, uncomplicated: Secondary | ICD-10-CM

## 2012-07-13 DIAGNOSIS — I10 Essential (primary) hypertension: Secondary | ICD-10-CM

## 2012-07-13 DIAGNOSIS — M109 Gout, unspecified: Secondary | ICD-10-CM | POA: Diagnosis not present

## 2012-07-13 DIAGNOSIS — R209 Unspecified disturbances of skin sensation: Secondary | ICD-10-CM | POA: Diagnosis not present

## 2012-07-13 DIAGNOSIS — N320 Bladder-neck obstruction: Secondary | ICD-10-CM | POA: Diagnosis not present

## 2012-07-13 DIAGNOSIS — N529 Male erectile dysfunction, unspecified: Secondary | ICD-10-CM

## 2012-07-13 DIAGNOSIS — H547 Unspecified visual loss: Secondary | ICD-10-CM

## 2012-07-13 DIAGNOSIS — R202 Paresthesia of skin: Secondary | ICD-10-CM

## 2012-07-13 LAB — HEPATIC FUNCTION PANEL
ALT: 13 U/L (ref 0–53)
AST: 16 U/L (ref 0–37)
Albumin: 3.8 g/dL (ref 3.5–5.2)
Alkaline Phosphatase: 63 U/L (ref 39–117)
Total Protein: 6.9 g/dL (ref 6.0–8.3)

## 2012-07-13 LAB — CBC WITH DIFFERENTIAL/PLATELET
Basophils Absolute: 0.1 10*3/uL (ref 0.0–0.1)
Eosinophils Absolute: 0.4 10*3/uL (ref 0.0–0.7)
Eosinophils Relative: 3.9 % (ref 0.0–5.0)
Lymphs Abs: 3.9 10*3/uL (ref 0.7–4.0)
MCHC: 33.7 g/dL (ref 30.0–36.0)
MCV: 99.9 fl (ref 78.0–100.0)
Monocytes Absolute: 0.6 10*3/uL (ref 0.1–1.0)
Neutrophils Relative %: 52.3 % (ref 43.0–77.0)
Platelets: 276 10*3/uL (ref 150.0–400.0)
RDW: 14.3 % (ref 11.5–14.6)
WBC: 10.3 10*3/uL (ref 4.5–10.5)

## 2012-07-13 LAB — BASIC METABOLIC PANEL
BUN: 12 mg/dL (ref 6–23)
Chloride: 105 mEq/L (ref 96–112)
Creatinine, Ser: 1 mg/dL (ref 0.4–1.5)
Glucose, Bld: 112 mg/dL — ABNORMAL HIGH (ref 70–99)

## 2012-07-13 LAB — LIPID PANEL
Cholesterol: 212 mg/dL — ABNORMAL HIGH (ref 0–200)
Triglycerides: 192 mg/dL — ABNORMAL HIGH (ref 0.0–149.0)

## 2012-07-13 LAB — URIC ACID: Uric Acid, Serum: 6.6 mg/dL (ref 4.0–7.8)

## 2012-07-13 MED ORDER — HYDROCODONE-ACETAMINOPHEN 5-325 MG PO TABS: 1.0000 | ORAL_TABLET | Freq: Two times a day (BID) | ORAL | Status: DC | PRN

## 2012-07-13 MED ORDER — FINASTERIDE 5 MG PO TABS: 5.0000 mg | ORAL_TABLET | Freq: Every day | ORAL | Status: DC

## 2012-07-13 MED ORDER — COLCHICINE 0.6 MG PO TABS: 0.6000 mg | ORAL_TABLET | Freq: Four times a day (QID) | ORAL | Status: DC | PRN

## 2012-07-13 MED ORDER — SILDENAFIL CITRATE 100 MG PO TABS: 100.0000 mg | ORAL_TABLET | Freq: Every day | ORAL | Status: DC | PRN

## 2012-07-13 MED ORDER — INDOMETHACIN 50 MG PO CAPS: 50.0000 mg | ORAL_CAPSULE | Freq: Three times a day (TID) | ORAL | Status: DC

## 2012-07-13 MED ORDER — AMLODIPINE BESYLATE 5 MG PO TABS: 5.0000 mg | ORAL_TABLET | Freq: Every day | ORAL | Status: DC

## 2012-07-13 NOTE — Assessment & Plan Note (Signed)
Continue with current prescription therapy as reflected on the Med list.  

## 2012-07-13 NOTE — Assessment & Plan Note (Signed)
Start Proscar PSA

## 2012-07-13 NOTE — Assessment & Plan Note (Addendum)
The patient is here for annual Medicare wellness examination and management of other chronic and acute problems.   The risk factors are reflected in the social history.  The roster of all physicians providing medical care to patient - is listed in the Snapshot section of the chart.  Activities of daily living:  The patient is 100% inedpendent in all ADLs: dressing, toileting, feeding as well as independent mobility  Home safety : The patient has smoke detectors in the home. They wear seatbelts.No firearms at home ( firearms are present in the home, kept in a safe fashion). There is no violence in the home.   There is no risks for hepatitis, STDs or HIV. There is no   history of blood transfusion. They have no travel history to infectious disease endemic areas of the world.  The patient has (has not) seen their dentist in the last six month. They have (not) seen their eye doctor in the last year. They deny (admit to) any hearing difficulty and have not had audiologic testing in the last year.  They do not  have excessive sun exposure. Discussed the need for sun protection: hats, long sleeves and use of sunscreen if there is significant sun exposure.   Diet: the importance of a healthy diet is discussed. They do have a healthy (unhealthy-high fat/fast food) diet.  The patient has no regular exercise program: active physically, however.  The benefits of regular aerobic exercise were discussed.  Depression screen: there are no signs or vegative symptoms of depression- irritability, change in appetite, anhedonia, sadness/tearfullness.  Cognitive assessment: the patient manages all their financial and personal affairs and is actively engaged. They could relate day,date,year and events; recalled 3/3 objects at 3 minutes; performed clock-face test normally.  The following portions of the patient's history were reviewed and updated as appropriate: allergies, current medications, past family history,  past medical history,  past surgical history, past social history  and problem list.  Vision is worse, hearing, body mass index were assessed and reviewed.   During the course of the visit the patient was educated and counseled about appropriate screening and preventive services including : fall prevention , diabetes screening, nutrition counseling, colorectal cancer screening, and recommended immunizations.

## 2012-07-13 NOTE — Progress Notes (Signed)
Subjective:    Patient ID: Brent Turner, male    DOB: 1941/08/26, 71 y.o.   MRN: [id number]  HPI  The patient is here for a wellness exam. The patient has been doing well overall without major physical or psychological issues going on lately, however, he lost wt. C/o vision loss... The patient needs to address  chronic hypertension that has been well controlled with medicines, goutReview of Systems  Constitutional: Negative for appetite change, fatigue and unexpected weight change.  HENT: Negative for nosebleeds, congestion, sore throat, sneezing, trouble swallowing and neck pain.   Eyes: Negative for itching and visual disturbance.  Respiratory: Negative for cough.   Cardiovascular: Negative for chest pain, palpitations and leg swelling.  Gastrointestinal: Negative for nausea, diarrhea, blood in stool and abdominal distention.  Genitourinary: Negative for frequency and hematuria.  Musculoskeletal: Negative for back pain, joint swelling and gait problem.  Skin: Negative for rash.  Neurological: Negative for dizziness, tremors, speech difficulty and weakness.  Psychiatric/Behavioral: Negative for sleep disturbance, dysphoric mood and agitation. The patient is not nervous/anxious.    Wt Readings from Last 3 Encounters:  07/13/12 155 lb (70.308 kg)  05/14/11 156 lb (70.761 kg)  02/08/10 167 lb 4 oz (75.864 kg)   BP Readings from Last 3 Encounters:  07/13/12 142/80  05/14/11 120/72  02/08/10 130/80        Objective:   Physical Exam  Constitutional: He is oriented to person, place, and time. He appears well-developed and well-nourished. No distress.  HENT:  Head: Normocephalic and atraumatic.  Right Ear: External ear normal.  Left Ear: External ear normal.  Nose: Nose normal.  Mouth/Throat: Oropharynx is clear and moist. No oropharyngeal exudate.  Eyes: Conjunctivae normal and EOM are normal. Pupils are equal, round, and reactive to light. Right eye exhibits no discharge. Left  eye exhibits no discharge. No scleral icterus.  Neck: Normal range of motion. Neck supple. No JVD present. No tracheal deviation present. No thyromegaly present.  Cardiovascular: Normal rate, regular rhythm, normal heart sounds and intact distal pulses.  Exam reveals no gallop and no friction rub.   No murmur heard. Pulmonary/Chest: Effort normal and breath sounds normal. No stridor. No respiratory distress. He has no wheezes. He has no rales. He exhibits no tenderness.  Abdominal: Soft. Bowel sounds are normal. He exhibits no distension and no mass. There is no tenderness. There is no rebound and no guarding.  Genitourinary: Rectum normal, prostate normal and penis normal. Guaiac negative stool. No penile tenderness.  Musculoskeletal: Normal range of motion. He exhibits no edema and no tenderness.  Lymphadenopathy:    He has no cervical adenopathy.  Neurological: He is alert and oriented to person, place, and time. He has normal reflexes. No cranial nerve deficit. He exhibits normal muscle tone. Coordination normal.  Skin: Skin is warm and dry. No rash noted. He is not diaphoretic. No erythema. No pallor.  Psychiatric: He has a normal mood and affect. His behavior is normal. Judgment and thought content normal.    Lab Results  Component Value Date   WBC 9.5 02/08/2010   HGB 14.5 02/08/2010   HCT 41.8 02/08/2010   PLT 322.0 02/08/2010   GLUCOSE 111* 02/08/2010   CHOL 201* 08/15/2008   TRIG 130 08/15/2008   HDL 38.6* 08/15/2008   LDLDIRECT 130.0 08/15/2008   ALT 20 02/08/2010   AST 18 02/08/2010   NA 143 02/08/2010   K 4.9 02/08/2010   CL 105 02/08/2010   CREATININE 1.1  02/08/2010   BUN 12 02/08/2010   CO2 29 02/08/2010   TSH 1.21 02/08/2010   PSA 1.27 02/08/2010   HGBA1C 7.4* 01/28/2007          Assessment & Plan:

## 2012-07-13 NOTE — Assessment & Plan Note (Signed)
Discussed.

## 2012-07-14 LAB — PSA: PSA: 3.78 ng/mL (ref 0.10–4.00)

## 2012-07-17 DIAGNOSIS — H11159 Pinguecula, unspecified eye: Secondary | ICD-10-CM | POA: Diagnosis not present

## 2012-07-17 DIAGNOSIS — H2510 Age-related nuclear cataract, unspecified eye: Secondary | ICD-10-CM | POA: Diagnosis not present

## 2012-07-17 DIAGNOSIS — H40039 Anatomical narrow angle, unspecified eye: Secondary | ICD-10-CM | POA: Diagnosis not present

## 2012-07-17 DIAGNOSIS — H25019 Cortical age-related cataract, unspecified eye: Secondary | ICD-10-CM | POA: Diagnosis not present

## 2013-01-12 ENCOUNTER — Ambulatory Visit (INDEPENDENT_AMBULATORY_CARE_PROVIDER_SITE_OTHER): Payer: Medicare Other | Admitting: Internal Medicine

## 2013-01-12 ENCOUNTER — Encounter: Payer: Self-pay | Admitting: Internal Medicine

## 2013-01-12 VITALS — BP 134/82 | HR 76 | Temp 98.1°F | Resp 16 | Wt 159.0 lb

## 2013-01-12 DIAGNOSIS — N320 Bladder-neck obstruction: Secondary | ICD-10-CM | POA: Diagnosis not present

## 2013-01-12 DIAGNOSIS — E785 Hyperlipidemia, unspecified: Secondary | ICD-10-CM | POA: Diagnosis not present

## 2013-01-12 DIAGNOSIS — Z0000 Encounter for general adult medical examination without abnormal findings: Secondary | ICD-10-CM | POA: Diagnosis not present

## 2013-01-12 MED ORDER — SILDENAFIL CITRATE 100 MG PO TABS: 100.0000 mg | ORAL_TABLET | Freq: Every day | ORAL | Status: DC | PRN

## 2013-01-12 MED ORDER — FINASTERIDE 5 MG PO TABS: 5.0000 mg | ORAL_TABLET | Freq: Every day | ORAL | Status: DC

## 2013-01-12 MED ORDER — INDOMETHACIN 50 MG PO CAPS: 50.0000 mg | ORAL_CAPSULE | Freq: Three times a day (TID) | ORAL | Status: DC

## 2013-01-12 MED ORDER — HYDROCODONE-ACETAMINOPHEN 5-325 MG PO TABS: 1.0000 | ORAL_TABLET | Freq: Two times a day (BID) | ORAL | Status: DC | PRN

## 2013-01-12 MED ORDER — AMLODIPINE BESYLATE 5 MG PO TABS: 5.0000 mg | ORAL_TABLET | Freq: Every day | ORAL | Status: DC

## 2013-01-12 NOTE — Progress Notes (Signed)
Subjective:     HPI   The patient has been doing well overall without major physical or psychological issues going on lately, however, he lost wt.  The patient needs to address  chronic hypertension that has been well controlled with medicines, goutReview of Systems  Constitutional: Negative for appetite change, fatigue and unexpected weight change.  HENT: Negative for nosebleeds, congestion, sore throat, sneezing, trouble swallowing and neck pain.   Eyes: Negative for itching and visual disturbance.  Respiratory: Negative for cough.   Cardiovascular: Negative for chest pain, palpitations and leg swelling.  Gastrointestinal: Negative for nausea, diarrhea, blood in stool and abdominal distention.  Genitourinary: Negative for frequency and hematuria.  Musculoskeletal: Negative for back pain, joint swelling and gait problem.  Skin: Negative for rash.  Neurological: Negative for dizziness, tremors, speech difficulty and weakness.  Psychiatric/Behavioral: Negative for sleep disturbance, dysphoric mood and agitation. The patient is not nervous/anxious.    Wt Readings from Last 3 Encounters:  01/12/13 159 lb (72.122 kg)  07/13/12 155 lb (70.308 kg)  05/14/11 156 lb (70.761 kg)   BP Readings from Last 3 Encounters:  01/12/13 134/82  07/13/12 142/80  05/14/11 120/72        Objective:   Physical Exam  Constitutional: He is oriented to person, place, and time. He appears well-developed and well-nourished. No distress.  HENT:  Head: Normocephalic and atraumatic.  Right Ear: External ear normal.  Left Ear: External ear normal.  Nose: Nose normal.  Mouth/Throat: Oropharynx is clear and moist. No oropharyngeal exudate.  Eyes: Conjunctivae and EOM are normal. Pupils are equal, round, and reactive to light. Right eye exhibits no discharge. Left eye exhibits no discharge. No scleral icterus.  Neck: Normal range of motion. Neck supple. No JVD present. No tracheal deviation present. No  thyromegaly present.  Cardiovascular: Normal rate, regular rhythm, normal heart sounds and intact distal pulses.  Exam reveals no gallop and no friction rub.   No murmur heard. Pulmonary/Chest: Effort normal and breath sounds normal. No stridor. No respiratory distress. He has no wheezes. He has no rales. He exhibits no tenderness.  Abdominal: Soft. Bowel sounds are normal. He exhibits no distension and no mass. There is no tenderness. There is no rebound and no guarding.  Genitourinary: Rectum normal, prostate normal and penis normal. Guaiac negative stool. No penile tenderness.  Musculoskeletal: Normal range of motion. He exhibits no edema and no tenderness.  Lymphadenopathy:    He has no cervical adenopathy.  Neurological: He is alert and oriented to person, place, and time. He has normal reflexes. No cranial nerve deficit. He exhibits normal muscle tone. Coordination normal.  Skin: Skin is warm and dry. No rash noted. He is not diaphoretic. No erythema. No pallor.  Psychiatric: He has a normal mood and affect. His behavior is normal. Judgment and thought content normal.    Lab Results  Component Value Date   WBC 10.3 07/13/2012   HGB 15.2 07/13/2012   HCT 45.2 07/13/2012   PLT 276.0 07/13/2012   GLUCOSE 112* 07/13/2012   CHOL 212* 07/13/2012   TRIG 192.0* 07/13/2012   HDL 42.40 07/13/2012   LDLDIRECT 139.3 07/13/2012   ALT 13 07/13/2012   AST 16 07/13/2012   NA 140 07/13/2012   K 4.1 07/13/2012   CL 105 07/13/2012   CREATININE 1.0 07/13/2012   BUN 12 07/13/2012   CO2 28 07/13/2012   TSH 1.31 07/13/2012   PSA 3.78 07/13/2012   HGBA1C 7.4* 01/28/2007  Assessment & Plan:

## 2013-07-16 ENCOUNTER — Encounter: Payer: Self-pay | Admitting: Internal Medicine

## 2013-07-16 ENCOUNTER — Ambulatory Visit (INDEPENDENT_AMBULATORY_CARE_PROVIDER_SITE_OTHER): Payer: Medicare Other | Admitting: Internal Medicine

## 2013-07-16 ENCOUNTER — Ambulatory Visit (INDEPENDENT_AMBULATORY_CARE_PROVIDER_SITE_OTHER): Payer: Medicare Other

## 2013-07-16 VITALS — BP 130/68 | HR 72 | Temp 98.9°F | Resp 16 | Ht 72.0 in | Wt 157.0 lb

## 2013-07-16 DIAGNOSIS — I10 Essential (primary) hypertension: Secondary | ICD-10-CM | POA: Diagnosis not present

## 2013-07-16 DIAGNOSIS — N529 Male erectile dysfunction, unspecified: Secondary | ICD-10-CM | POA: Diagnosis not present

## 2013-07-16 DIAGNOSIS — E785 Hyperlipidemia, unspecified: Secondary | ICD-10-CM | POA: Diagnosis not present

## 2013-07-16 DIAGNOSIS — N320 Bladder-neck obstruction: Secondary | ICD-10-CM

## 2013-07-16 DIAGNOSIS — E559 Vitamin D deficiency, unspecified: Secondary | ICD-10-CM | POA: Diagnosis not present

## 2013-07-16 DIAGNOSIS — Z23 Encounter for immunization: Secondary | ICD-10-CM

## 2013-07-16 DIAGNOSIS — Z0000 Encounter for general adult medical examination without abnormal findings: Secondary | ICD-10-CM | POA: Diagnosis not present

## 2013-07-16 LAB — URINALYSIS, ROUTINE W REFLEX MICROSCOPIC
Bilirubin Urine: NEGATIVE
Nitrite: NEGATIVE
RBC / HPF: NONE SEEN (ref 0–?)
Specific Gravity, Urine: <=1.005 (ref 1.000–1.030)
Total Protein, Urine: NEGATIVE
Urine Glucose: NEGATIVE
Urobilinogen, UA: 0.2 (ref 0.0–1.0)

## 2013-07-16 LAB — CBC WITH DIFFERENTIAL/PLATELET
Basophils Absolute: 0.1 10*3/uL (ref 0.0–0.1)
Eosinophils Absolute: 0.3 10*3/uL (ref 0.0–0.7)
Eosinophils Relative: 2.2 % (ref 0.0–5.0)
HCT: 46.7 % (ref 39.0–52.0)
Hemoglobin: 15.6 g/dL (ref 13.0–17.0)
Lymphs Abs: 4 10*3/uL (ref 0.7–4.0)
MCHC: 33.4 g/dL (ref 30.0–36.0)
MCV: 97.1 fl (ref 78.0–100.0)
Monocytes Absolute: 0.6 10*3/uL (ref 0.1–1.0)
Monocytes Relative: 4.7 % (ref 3.0–12.0)
Neutrophils Relative %: 60 % (ref 43.0–77.0)
Platelets: 303 10*3/uL (ref 150.0–400.0)
RBC: 4.81 Mil/uL (ref 4.22–5.81)
RDW: 13.4 % (ref 11.5–14.6)

## 2013-07-16 LAB — HEPATIC FUNCTION PANEL
ALT: 18 U/L (ref 0–53)
AST: 15 U/L (ref 0–37)
Albumin: 4 g/dL (ref 3.5–5.2)
Bilirubin, Direct: 0.1 mg/dL (ref 0.0–0.3)
Total Bilirubin: 0.6 mg/dL (ref 0.3–1.2)
Total Protein: 7.3 g/dL (ref 6.0–8.3)

## 2013-07-16 LAB — BASIC METABOLIC PANEL
CO2: 26 mEq/L (ref 19–32)
Calcium: 10.1 mg/dL (ref 8.4–10.5)
Chloride: 102 mEq/L (ref 96–112)
Creatinine, Ser: 1 mg/dL (ref 0.4–1.5)
Glucose, Bld: 91 mg/dL (ref 70–99)

## 2013-07-16 LAB — LIPID PANEL
Cholesterol: 245 mg/dL — ABNORMAL HIGH (ref 0–200)
HDL: 38.4 mg/dL — ABNORMAL LOW (ref >39.00)
Triglycerides: 255 mg/dL — ABNORMAL HIGH (ref 0.0–149.0)

## 2013-07-16 LAB — TSH: TSH: 2 u[IU]/mL (ref 0.35–5.50)

## 2013-07-16 MED ORDER — TAMSULOSIN HCL 0.4 MG PO CAPS: 0.4000 mg | ORAL_CAPSULE | Freq: Every day | ORAL | Status: DC

## 2013-07-16 MED ORDER — INDOMETHACIN 50 MG PO CAPS: 50.0000 mg | ORAL_CAPSULE | Freq: Three times a day (TID) | ORAL | Status: DC

## 2013-07-16 MED ORDER — TRIAMCINOLONE ACETONIDE 0.5 % EX CREA: 1.0000 "application " | TOPICAL_CREAM | Freq: Two times a day (BID) | CUTANEOUS | Status: DC

## 2013-07-16 MED ORDER — HYDROCODONE-ACETAMINOPHEN 5-325 MG PO TABS: 1.0000 | ORAL_TABLET | Freq: Two times a day (BID) | ORAL | Status: DC | PRN

## 2013-07-16 NOTE — Progress Notes (Signed)
Subjective:     HPI Well exam  The patient has been doing well overall without major physical or psychological issues going on lately, however, he lost wt.   The patient needs to address  chronic hypertension that has been well controlled with medicines, goutReview of Systems  Constitutional: Negative for appetite change, fatigue and unexpected weight change.  HENT: Negative for congestion, nosebleeds, sneezing, sore throat and trouble swallowing.   Eyes: Negative for itching and visual disturbance.  Respiratory: Negative for cough.   Cardiovascular: Negative for chest pain, palpitations and leg swelling.  Gastrointestinal: Negative for nausea, diarrhea, blood in stool and abdominal distention.  Genitourinary: Negative for frequency and hematuria.  Musculoskeletal: Negative for back pain, gait problem, joint swelling and neck pain.  Skin: Negative for rash.  Neurological: Negative for dizziness, tremors, speech difficulty and weakness.  Psychiatric/Behavioral: Negative for sleep disturbance, dysphoric mood and agitation. The patient is not nervous/anxious.    Wt Readings from Last 3 Encounters:  07/16/13 157 lb (71.215 kg)  01/12/13 159 lb (72.122 kg)  07/13/12 155 lb (70.308 kg)   BP Readings from Last 3 Encounters:  07/16/13 130/68  01/12/13 134/82  07/13/12 142/80        Objective:   Physical Exam  Constitutional: He is oriented to person, place, and time. He appears well-developed and well-nourished. No distress.  HENT:  Head: Normocephalic and atraumatic.  Right Ear: External ear normal.  Left Ear: External ear normal.  Nose: Nose normal.  Mouth/Throat: Oropharynx is clear and moist. No oropharyngeal exudate.  Eyes: Conjunctivae and EOM are normal. Pupils are equal, round, and reactive to light. Right eye exhibits no discharge. Left eye exhibits no discharge. No scleral icterus.  Neck: Normal range of motion. Neck supple. No JVD present. No tracheal deviation  present. No thyromegaly present.  Cardiovascular: Normal rate, regular rhythm, normal heart sounds and intact distal pulses.  Exam reveals no gallop and no friction rub.   No murmur heard. Pulmonary/Chest: Effort normal and breath sounds normal. No stridor. No respiratory distress. He has no wheezes. He has no rales. He exhibits no tenderness.  Abdominal: Soft. Bowel sounds are normal. He exhibits no distension and no mass. There is no tenderness. There is no rebound and no guarding.  Genitourinary: Rectum normal, prostate normal and penis normal. Guaiac negative stool. No penile tenderness.  Musculoskeletal: Normal range of motion. He exhibits no edema and no tenderness.  Lymphadenopathy:    He has no cervical adenopathy.  Neurological: He is alert and oriented to person, place, and time. He has normal reflexes. No cranial nerve deficit. He exhibits normal muscle tone. Coordination normal.  Skin: Skin is warm and dry. No rash noted. He is not diaphoretic. No erythema. No pallor.  Psychiatric: He has a normal mood and affect. His behavior is normal. Judgment and thought content normal.    Lab Results  Component Value Date   WBC 10.3 07/13/2012   HGB 15.2 07/13/2012   HCT 45.2 07/13/2012   PLT 276.0 07/13/2012   GLUCOSE 112* 07/13/2012   CHOL 212* 07/13/2012   TRIG 192.0* 07/13/2012   HDL 42.40 07/13/2012   LDLDIRECT 139.3 07/13/2012   ALT 13 07/13/2012   AST 16 07/13/2012   NA 140 07/13/2012   K 4.1 07/13/2012   CL 105 07/13/2012   CREATININE 1.0 07/13/2012   BUN 12 07/13/2012   CO2 28 07/13/2012   TSH 1.31 07/13/2012   PSA 3.78 07/13/2012   HGBA1C 7.4* 01/28/2007  Assessment & Plan:

## 2013-07-16 NOTE — Assessment & Plan Note (Signed)
On proscar Added Flomax

## 2013-07-16 NOTE — Progress Notes (Signed)
Pre visit review using our clinic review tool, if applicable. No additional management support is needed unless otherwise documented below in the visit note. 

## 2013-07-16 NOTE — Assessment & Plan Note (Signed)
The patient is here for annual Medicare wellness examination and management of other chronic and acute problems.   The risk factors are reflected in the social history.  The roster of all physicians providing medical care to patient - is listed in the Snapshot section of the chart.  Activities of daily living:  The patient is 100% inedpendent in all ADLs: dressing, toileting, feeding as well as independent mobility  Home safety : The patient has smoke detectors in the home. They wear seatbelts.No firearms at home ( firearms are present in the home, kept in a safe fashion). There is no violence in the home.   There is no risks for hepatitis, STDs or HIV. There is no   history of blood transfusion. They have no travel history to infectious disease endemic areas of the world.  The patient has (has not) seen their dentist in the last six month. They have (not) seen their eye doctor in the last year. They deny (admit to) any hearing difficulty and have not had audiologic testing in the last year.  They do not  have excessive sun exposure. Discussed the need for sun protection: hats, long sleeves and use of sunscreen if there is significant sun exposure.   Diet: the importance of a healthy diet is discussed. They do have a healthy (unhealthy-high fat/fast food) diet.  The patient has no regular exercise program: active physically, however.  The benefits of regular aerobic exercise were discussed.  Depression screen: there are no signs or vegative symptoms of depression- irritability, change in appetite, anhedonia, sadness/tearfullness.  Cognitive assessment: the patient manages all their financial and personal affairs and is actively engaged. They could relate day,date,year and events; recalled 3/3 objects at 3 minutes; performed clock-face test normally.  The following portions of the patient's history were reviewed and updated as appropriate: allergies, current medications, past family history,  past medical history,  past surgical history, past social history  and problem list.  Vision, hearing, body mass index were assessed and reviewed.   During the course of the visit the patient was educated and counseled about appropriate screening and preventive services including : fall prevention , diabetes screening, nutrition counseling, colorectal cancer screening, and recommended immunizations.

## 2013-07-17 NOTE — Assessment & Plan Note (Signed)
Continue with current prescription therapy as reflected on the Med list.  

## 2013-07-19 LAB — LDL CHOLESTEROL, DIRECT: Direct LDL: 106.6 mg/dL

## 2013-10-14 ENCOUNTER — Telehealth: Payer: Self-pay | Admitting: Internal Medicine

## 2013-10-14 NOTE — Telephone Encounter (Signed)
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

## 2013-10-15 ENCOUNTER — Ambulatory Visit: Payer: Medicare Other | Admitting: Internal Medicine

## 2013-10-15 NOTE — Telephone Encounter (Signed)
No, I haven't seen any yet Thx

## 2013-10-18 NOTE — Telephone Encounter (Signed)
Pt is aware.  

## 2013-12-28 ENCOUNTER — Telehealth: Payer: Self-pay | Admitting: Internal Medicine

## 2013-12-29 NOTE — Telephone Encounter (Signed)
PA request sent in by pharmacy

## 2013-12-30 ENCOUNTER — Other Ambulatory Visit: Payer: Self-pay | Admitting: Internal Medicine

## 2013-12-30 NOTE — Telephone Encounter (Signed)
The rx Indomethacin is not covered. Can it be changed to the Diclonfenac?

## 2014-01-03 NOTE — Telephone Encounter (Signed)
Ok Thx 

## 2014-01-03 NOTE — Addendum Note (Signed)
Addended by: Tresa GarterPLOTNIKOV, ALEKSEI V on: 01/03/2014 12:05 AM   Modules accepted: Orders, Medications

## 2014-01-12 ENCOUNTER — Encounter: Payer: Self-pay | Admitting: Internal Medicine

## 2014-01-12 ENCOUNTER — Ambulatory Visit (INDEPENDENT_AMBULATORY_CARE_PROVIDER_SITE_OTHER): Payer: Medicare Other | Admitting: Internal Medicine

## 2014-01-12 VITALS — BP 138/74 | Temp 97.4°F | Wt 157.0 lb

## 2014-01-12 DIAGNOSIS — M25559 Pain in unspecified hip: Secondary | ICD-10-CM | POA: Diagnosis not present

## 2014-01-12 DIAGNOSIS — M25551 Pain in right hip: Secondary | ICD-10-CM | POA: Insufficient documentation

## 2014-01-12 MED ORDER — SILDENAFIL CITRATE 100 MG PO TABS: 100.0000 mg | ORAL_TABLET | Freq: Every day | ORAL | Status: DC | PRN

## 2014-01-12 MED ORDER — HYDROCODONE-ACETAMINOPHEN 5-325 MG PO TABS: 1.0000 | ORAL_TABLET | Freq: Two times a day (BID) | ORAL | Status: DC | PRN

## 2014-01-12 MED ORDER — TAMSULOSIN HCL 0.4 MG PO CAPS: 0.4000 mg | ORAL_CAPSULE | Freq: Every day | ORAL | Status: DC

## 2014-01-12 MED ORDER — AMLODIPINE BESYLATE 5 MG PO TABS: 5.0000 mg | ORAL_TABLET | Freq: Every day | ORAL | Status: DC

## 2014-01-12 MED ORDER — COLCHICINE 0.6 MG PO TABS: 0.6000 mg | ORAL_TABLET | Freq: Four times a day (QID) | ORAL | Status: DC | PRN

## 2014-01-12 NOTE — Progress Notes (Signed)
Subjective:     HPI  C/o R lat hip pain hen standing   The patient needs to address  chronic hypertension that has been well controlled with medicines, goutReview of Systems  Constitutional: Negative for appetite change, fatigue and unexpected weight change.  HENT: Negative for congestion, nosebleeds, sneezing, sore throat and trouble swallowing.   Eyes: Negative for itching and visual disturbance.  Respiratory: Negative for cough.   Cardiovascular: Negative for chest pain, palpitations and leg swelling.  Gastrointestinal: Negative for nausea, diarrhea, blood in stool and abdominal distention.  Genitourinary: Negative for frequency and hematuria.  Musculoskeletal: Negative for back pain, gait problem, joint swelling and neck pain.  Skin: Negative for rash.  Neurological: Negative for dizziness, tremors, speech difficulty and weakness.  Psychiatric/Behavioral: Negative for sleep disturbance, dysphoric mood and agitation. The patient is not nervous/anxious.    Wt Readings from Last 3 Encounters:  01/12/14 157 lb (71.215 kg)  07/16/13 157 lb (71.215 kg)  01/12/13 159 lb (72.122 kg)   BP Readings from Last 3 Encounters:  01/12/14 138/74  07/16/13 130/68  01/12/13 134/82        Objective:   Physical Exam  Constitutional: He is oriented to person, place, and time. He appears well-developed and well-nourished. No distress.  HENT:  Head: Normocephalic and atraumatic.  Right Ear: External ear normal.  Left Ear: External ear normal.  Nose: Nose normal.  Mouth/Throat: Oropharynx is clear and moist. No oropharyngeal exudate.  Eyes: Conjunctivae and EOM are normal. Pupils are equal, round, and reactive to light. Right eye exhibits no discharge. Left eye exhibits no discharge. No scleral icterus.  Neck: Normal range of motion. Neck supple. No JVD present. No tracheal deviation present. No thyromegaly present.  Cardiovascular: Normal rate, regular rhythm, normal heart sounds and  intact distal pulses.  Exam reveals no gallop and no friction rub.   No murmur heard. Pulmonary/Chest: Effort normal and breath sounds normal. No stridor. No respiratory distress. He has no wheezes. He has no rales. He exhibits no tenderness.  Abdominal: Soft. Bowel sounds are normal. He exhibits no distension and no mass. There is no tenderness. There is no rebound and no guarding.  Genitourinary: Rectum normal, prostate normal and penis normal. Guaiac negative stool. No penile tenderness.  Musculoskeletal: Normal range of motion. He exhibits no edema and no tenderness.  Lymphadenopathy:    He has no cervical adenopathy.  Neurological: He is alert and oriented to person, place, and time. He has normal reflexes. No cranial nerve deficit. He exhibits normal muscle tone. Coordination normal.  Skin: Skin is warm and dry. No rash noted. He is not diaphoretic. No erythema. No pallor.  Psychiatric: He has a normal mood and affect. His behavior is normal. Judgment and thought content normal.  R lat hip is tender  Lab Results  Component Value Date   WBC 11.8* 07/16/2013   HGB 15.6 07/16/2013   HCT 46.7 07/16/2013   PLT 303.0 07/16/2013   GLUCOSE 91 07/16/2013   CHOL 245* 07/16/2013   TRIG 255.0* 07/16/2013   HDL 38.40* 07/16/2013   LDLDIRECT 106.6 07/16/2013   ALT 18 07/16/2013   AST 15 07/16/2013   NA 137 07/16/2013   K 4.0 07/16/2013   CL 102 07/16/2013   CREATININE 1.0 07/16/2013   BUN 14 07/16/2013   CO2 26 07/16/2013   TSH 2.00 07/16/2013   PSA 1.30 07/16/2013   HGBA1C 7.4* 01/28/2007          Assessment & Plan:

## 2014-01-12 NOTE — Assessment & Plan Note (Signed)
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

## 2014-01-12 NOTE — Progress Notes (Signed)
Pre visit review using our clinic review tool, if applicable. No additional management support is needed unless otherwise documented below in the visit note. 

## 2014-01-13 ENCOUNTER — Telehealth: Payer: Self-pay | Admitting: Internal Medicine

## 2014-01-13 NOTE — Telephone Encounter (Signed)
Relevant patient education mailed to patient.  

## 2014-01-17 ENCOUNTER — Encounter: Payer: Self-pay | Admitting: Neurology

## 2014-01-17 NOTE — Telephone Encounter (Signed)
[phone] please call patient

## 2014-01-18 NOTE — Telephone Encounter (Signed)
This encounter was created in error - please disregard.

## 2014-01-20 MED ORDER — DICLOFENAC SODIUM 75 MG PO TBEC: 75.0000 mg | DELAYED_RELEASE_TABLET | Freq: Two times a day (BID) | ORAL | Status: DC | PRN

## 2014-03-23 ENCOUNTER — Other Ambulatory Visit: Payer: Self-pay

## 2014-03-23 MED ORDER — TAMSULOSIN HCL 0.4 MG PO CAPS: 0.4000 mg | ORAL_CAPSULE | Freq: Every day | ORAL | Status: DC

## 2014-04-18 ENCOUNTER — Other Ambulatory Visit: Payer: Self-pay | Admitting: Internal Medicine

## 2014-07-15 ENCOUNTER — Ambulatory Visit: Payer: Medicare Other | Admitting: Internal Medicine

## 2014-07-26 ENCOUNTER — Ambulatory Visit (INDEPENDENT_AMBULATORY_CARE_PROVIDER_SITE_OTHER): Payer: Medicare Other | Admitting: Internal Medicine

## 2014-07-26 ENCOUNTER — Encounter: Payer: Self-pay | Admitting: Internal Medicine

## 2014-07-26 ENCOUNTER — Other Ambulatory Visit (INDEPENDENT_AMBULATORY_CARE_PROVIDER_SITE_OTHER): Payer: Medicare Other

## 2014-07-26 VITALS — BP 120/78 | HR 88 | Temp 98.2°F | Wt 160.0 lb

## 2014-07-26 DIAGNOSIS — Z23 Encounter for immunization: Secondary | ICD-10-CM | POA: Diagnosis not present

## 2014-07-26 DIAGNOSIS — M1000 Idiopathic gout, unspecified site: Secondary | ICD-10-CM

## 2014-07-26 DIAGNOSIS — N320 Bladder-neck obstruction: Secondary | ICD-10-CM

## 2014-07-26 DIAGNOSIS — Z125 Encounter for screening for malignant neoplasm of prostate: Secondary | ICD-10-CM | POA: Diagnosis not present

## 2014-07-26 DIAGNOSIS — E559 Vitamin D deficiency, unspecified: Secondary | ICD-10-CM

## 2014-07-26 DIAGNOSIS — I10 Essential (primary) hypertension: Secondary | ICD-10-CM

## 2014-07-26 LAB — HEPATIC FUNCTION PANEL
ALT: 18 U/L (ref 0–53)
AST: 21 U/L (ref 0–37)
Albumin: 3.9 g/dL (ref 3.5–5.2)
Alkaline Phosphatase: 68 U/L (ref 39–117)
BILIRUBIN DIRECT: 0.1 mg/dL (ref 0.0–0.3)
TOTAL PROTEIN: 6.9 g/dL (ref 6.0–8.3)
Total Bilirubin: 0.5 mg/dL (ref 0.2–1.2)

## 2014-07-26 LAB — BASIC METABOLIC PANEL
BUN: 11 mg/dL (ref 6–23)
CO2: 30 mEq/L (ref 19–32)
CREATININE: 1 mg/dL (ref 0.4–1.5)
Calcium: 9.4 mg/dL (ref 8.4–10.5)
Chloride: 103 mEq/L (ref 96–112)
GFR: 97.57 mL/min (ref >60.00)
GLUCOSE: 98 mg/dL (ref 70–99)
Potassium: 4.4 mEq/L (ref 3.5–5.1)
SODIUM: 144 meq/L (ref 135–145)

## 2014-07-26 LAB — TSH: TSH: 1.55 u[IU]/mL (ref 0.35–4.50)

## 2014-07-26 LAB — PSA: PSA: 2.43 ng/mL (ref 0.10–4.00)

## 2014-07-26 MED ORDER — COLCHICINE 0.6 MG PO TABS: 0.6000 mg | ORAL_TABLET | Freq: Four times a day (QID) | ORAL | Status: DC | PRN

## 2014-07-26 MED ORDER — KETOPROFEN 50 MG PO CAPS: 50.0000 mg | ORAL_CAPSULE | Freq: Three times a day (TID) | ORAL | Status: DC | PRN

## 2014-07-26 NOTE — Assessment & Plan Note (Addendum)
Recurrent  Indocin prn Hydrococod prn

## 2014-07-26 NOTE — Assessment & Plan Note (Signed)
Continue with current prescription therapy as reflected on the Med list.  

## 2014-07-26 NOTE — Progress Notes (Signed)
Pre visit review using our clinic review tool, if applicable. No additional management support is needed unless otherwise documented below in the visit note. 

## 2014-07-26 NOTE — Assessment & Plan Note (Signed)
On rx 

## 2014-07-26 NOTE — Assessment & Plan Note (Signed)
On Flomax D/c'd Pro[facility]

## 2014-10-26 ENCOUNTER — Encounter: Payer: Self-pay | Admitting: Internal Medicine

## 2014-10-26 ENCOUNTER — Ambulatory Visit (INDEPENDENT_AMBULATORY_CARE_PROVIDER_SITE_OTHER): Payer: Medicare Other | Admitting: Internal Medicine

## 2014-10-26 VITALS — BP 140/82 | HR 78 | Temp 98.4°F | Wt 158.0 lb

## 2014-10-26 DIAGNOSIS — I10 Essential (primary) hypertension: Secondary | ICD-10-CM

## 2014-10-26 DIAGNOSIS — M25551 Pain in right hip: Secondary | ICD-10-CM

## 2014-10-26 DIAGNOSIS — M1000 Idiopathic gout, unspecified site: Secondary | ICD-10-CM | POA: Diagnosis not present

## 2014-10-26 MED ORDER — COLCHICINE 0.6 MG PO TABS: 0.6000 mg | ORAL_TABLET | Freq: Four times a day (QID) | ORAL | Status: DC | PRN

## 2014-10-26 MED ORDER — ALLOPURINOL 100 MG PO TABS: 100.0000 mg | ORAL_TABLET | Freq: Every day | ORAL | Status: DC

## 2014-10-26 MED ORDER — TAMSULOSIN HCL 0.4 MG PO CAPS: 0.4000 mg | ORAL_CAPSULE | Freq: Every day | ORAL | Status: DC

## 2014-10-26 MED ORDER — HYDROCODONE-ACETAMINOPHEN 5-325 MG PO TABS: 1.0000 | ORAL_TABLET | Freq: Two times a day (BID) | ORAL | Status: DC | PRN

## 2014-10-26 MED ORDER — AMLODIPINE BESYLATE 5 MG PO TABS: 5.0000 mg | ORAL_TABLET | Freq: Every day | ORAL | Status: DC

## 2014-10-26 MED ORDER — PREDNISONE 10 MG PO TABS: ORAL_TABLET | ORAL | Status: DC

## 2014-10-26 NOTE — Progress Notes (Signed)
Pre visit review using our clinic review tool, if applicable. No additional management support is needed unless otherwise documented below in the visit note. 

## 2014-10-26 NOTE — Assessment & Plan Note (Signed)
Cont w/Amlodipine 

## 2014-10-26 NOTE — Assessment & Plan Note (Signed)
D/c Ketoprofen Start Deltasone 40 mg x 2-3 d prn and Allopurinol 100 mg/d

## 2014-10-26 NOTE — Assessment & Plan Note (Signed)
Better  Norco prn  Potential benefits of a long term opioids use as well as potential risks (i.e. addiction risk, apnea etc) and complications (i.e. Somnolence, constipation and others) were explained to the patient and were aknowledged.

## 2014-10-26 NOTE — Progress Notes (Signed)
Subjective:     HPI  F/u R lat hip pain when standing - better   The patient needs to address  chronic hypertension that has been well controlled with medicines, gout. C/o Ketoprofen and colchicine not helping w/gout attack fast enough   tReview of Systems  Constitutional: Negative for appetite change, fatigue and unexpected weight change.  HENT: Negative for congestion, nosebleeds, sneezing, sore throat and trouble swallowing.   Eyes: Negative for itching and visual disturbance.  Respiratory: Negative for cough.   Cardiovascular: Negative for chest pain, palpitations and leg swelling.  Gastrointestinal: Negative for nausea, diarrhea, blood in stool and abdominal distention.  Genitourinary: Negative for frequency and hematuria.  Musculoskeletal: Negative for back pain, joint swelling, gait problem and neck pain.  Skin: Negative for rash.  Neurological: Negative for dizziness, tremors, speech difficulty and weakness.  Psychiatric/Behavioral: Negative for sleep disturbance, dysphoric mood and agitation. The patient is not nervous/anxious.      Wt Readings from Last 3 Encounters:  10/26/14 158 lb (71.668 kg)  07/26/14 160 lb (72.576 kg)  01/12/14 157 lb (71.215 kg)   BP Readings from Last 3 Encounters:  10/26/14 140/82  07/26/14 120/78  01/12/14 138/74        Objective:   Physical Exam  Constitutional: He is oriented to person, place, and time. He appears well-developed and well-nourished. No distress.  HENT:  Head: Normocephalic and atraumatic.  Right Ear: External ear normal.  Left Ear: External ear normal.  Nose: Nose normal.  Mouth/Throat: Oropharynx is clear and moist. No oropharyngeal exudate.  Eyes: Conjunctivae and EOM are normal. Pupils are equal, round, and reactive to light. Right eye exhibits no discharge. Left eye exhibits no discharge. No scleral icterus.  Neck: Normal range of motion. Neck supple. No JVD present. No tracheal deviation present. No  thyromegaly present.  Cardiovascular: Normal rate, regular rhythm, normal heart sounds and intact distal pulses.  Exam reveals no gallop and no friction rub.   No murmur heard. Pulmonary/Chest: Effort normal and breath sounds normal. No stridor. No respiratory distress. He has no wheezes. He has no rales. He exhibits no tenderness.  Abdominal: Soft. Bowel sounds are normal. He exhibits no distension and no mass. There is no tenderness. There is no rebound and no guarding.  Genitourinary: Rectum normal, prostate normal and penis normal. Guaiac negative stool. No penile tenderness.  Musculoskeletal: Normal range of motion. He exhibits no edema or tenderness.  Lymphadenopathy:    He has no cervical adenopathy.  Neurological: He is alert and oriented to person, place, and time. He has normal reflexes. No cranial nerve deficit. He exhibits normal muscle tone. Coordination normal.  Skin: Skin is warm and dry. No rash noted. He is not diaphoretic. No erythema. No pallor.  Psychiatric: He has a normal mood and affect. His behavior is normal. Judgment and thought content normal.  R lat hip is tender  Lab Results  Component Value Date   WBC 11.8* 07/16/2013   HGB 15.6 07/16/2013   HCT 46.7 07/16/2013   PLT 303.0 07/16/2013   GLUCOSE 98 07/26/2014   CHOL 245* 07/16/2013   TRIG 255.0* 07/16/2013   HDL 38.40* 07/16/2013   LDLDIRECT 106.6 07/16/2013   ALT 18 07/26/2014   AST 21 07/26/2014   NA 144 07/26/2014   K 4.4 07/26/2014   CL 103 07/26/2014   CREATININE 1.0 07/26/2014   BUN 11 07/26/2014   CO2 30 07/26/2014   TSH 1.55 07/26/2014   PSA 2.43 07/26/2014  HGBA1C 7.4* 01/28/2007          Assessment & Plan:

## 2014-10-27 ENCOUNTER — Telehealth: Payer: Self-pay | Admitting: Internal Medicine

## 2014-10-27 NOTE — Telephone Encounter (Signed)
EMMI EMAILED  °

## 2015-02-24 ENCOUNTER — Encounter: Payer: Self-pay | Admitting: Internal Medicine

## 2015-02-24 ENCOUNTER — Ambulatory Visit (INDEPENDENT_AMBULATORY_CARE_PROVIDER_SITE_OTHER): Payer: Medicare Other | Admitting: Internal Medicine

## 2015-02-24 VITALS — BP 139/82 | HR 93 | Wt 158.0 lb

## 2015-02-24 DIAGNOSIS — N528 Other male erectile dysfunction: Secondary | ICD-10-CM

## 2015-02-24 DIAGNOSIS — E559 Vitamin D deficiency, unspecified: Secondary | ICD-10-CM

## 2015-02-24 DIAGNOSIS — M1000 Idiopathic gout, unspecified site: Secondary | ICD-10-CM | POA: Diagnosis not present

## 2015-02-24 DIAGNOSIS — I10 Essential (primary) hypertension: Secondary | ICD-10-CM | POA: Diagnosis not present

## 2015-02-24 DIAGNOSIS — N529 Male erectile dysfunction, unspecified: Secondary | ICD-10-CM

## 2015-02-24 MED ORDER — SILDENAFIL CITRATE 100 MG PO TABS: 100.0000 mg | ORAL_TABLET | Freq: Every day | ORAL | Status: DC | PRN

## 2015-02-24 MED ORDER — HYDROCODONE-ACETAMINOPHEN 5-325 MG PO TABS: 1.0000 | ORAL_TABLET | Freq: Two times a day (BID) | ORAL | Status: DC | PRN

## 2015-02-24 NOTE — Assessment & Plan Note (Signed)
Recurrent. Better on Allopurinol Norco prn  Potential benefits of a long term opioids use as well as potential risks (i.e. addiction risk, apnea etc) and complications (i.e. Somnolence, constipation and others) were explained to the patient and were aknowledged.

## 2015-02-24 NOTE — Assessment & Plan Note (Signed)
Chronic  Amlodipine 

## 2015-02-24 NOTE — Progress Notes (Signed)
Pre visit review using our clinic review tool, if applicable. No additional management support is needed unless otherwise documented below in the visit note. 

## 2015-02-24 NOTE — Progress Notes (Signed)
   Subjective:    HPI  The patient needs to address  chronic hypertension that has been well controlled with medicines, gout. C/o Ketoprofen and colchicine not helping w/gout attack fast enough  BP Readings from Last 3 Encounters:  02/24/15 142/82  10/26/14 140/82  07/26/14 120/78   Wt Readings from Last 3 Encounters:  02/24/15 158 lb (71.668 kg)  10/26/14 158 lb (71.668 kg)  07/26/14 160 lb (72.576 kg)      Review of Systems  Constitutional: Negative for appetite change, fatigue and unexpected weight change.  HENT: Negative for congestion, nosebleeds, sneezing, sore throat and trouble swallowing.   Eyes: Negative for itching and visual disturbance.  Respiratory: Positive for cough and wheezing. Negative for chest tightness and shortness of breath.   Cardiovascular: Negative for chest pain, palpitations and leg swelling.  Gastrointestinal: Negative for nausea, diarrhea, blood in stool and abdominal distention.  Genitourinary: Negative for frequency and hematuria.  Musculoskeletal: Positive for back pain and arthralgias. Negative for joint swelling, gait problem and neck pain.  Skin: Negative for rash.  Neurological: Negative for dizziness, tremors, speech difficulty and weakness.  Psychiatric/Behavioral: Negative for sleep disturbance, dysphoric mood and agitation. The patient is not nervous/anxious.        Objective:   Physical Exam  Constitutional: He is oriented to person, place, and time. He appears well-developed. No distress.  NAD  HENT:  Mouth/Throat: Oropharynx is clear and moist.  Eyes: Conjunctivae are normal. Pupils are equal, round, and reactive to light.  Neck: Normal range of motion. No JVD present. No thyromegaly present.  Cardiovascular: Normal rate, regular rhythm, normal heart sounds and intact distal pulses.  Exam reveals no gallop and no friction rub.   No murmur heard. Pulmonary/Chest: Effort normal and breath sounds normal. No respiratory distress.  He has no wheezes. He has no rales. He exhibits no tenderness.  Abdominal: Soft. Bowel sounds are normal. He exhibits no distension and no mass. There is no tenderness. There is no rebound and no guarding.  Musculoskeletal: Normal range of motion. He exhibits no edema or tenderness.  Lymphadenopathy:    He has no cervical adenopathy.  Neurological: He is alert and oriented to person, place, and time. He has normal reflexes. No cranial nerve deficit. He exhibits normal muscle tone. He displays a negative Romberg sign. Coordination and gait normal.  Skin: Skin is warm and dry. No rash noted.  Psychiatric: He has a normal mood and affect. His behavior is normal. Judgment and thought content normal.    Lab Results  Component Value Date   WBC 11.8* 07/16/2013   HGB 15.6 07/16/2013   HCT 46.7 07/16/2013   PLT 303.0 07/16/2013   GLUCOSE 98 07/26/2014   CHOL 245* 07/16/2013   TRIG 255.0* 07/16/2013   HDL 38.40* 07/16/2013   LDLDIRECT 106.6 07/16/2013   ALT 18 07/26/2014   AST 21 07/26/2014   NA 144 07/26/2014   K 4.4 07/26/2014   CL 103 07/26/2014   CREATININE 1.0 07/26/2014   BUN 11 07/26/2014   CO2 30 07/26/2014   TSH 1.55 07/26/2014   PSA 2.43 07/26/2014   HGBA1C 7.4* 01/28/2007         Assessment & Plan:

## 2015-02-24 NOTE — Assessment & Plan Note (Signed)
On Vit D 

## 2015-02-24 NOTE — Assessment & Plan Note (Signed)
Viagra prn 

## 2015-05-22 ENCOUNTER — Other Ambulatory Visit: Payer: Self-pay | Admitting: Internal Medicine

## 2015-05-24 ENCOUNTER — Other Ambulatory Visit: Payer: Self-pay | Admitting: Internal Medicine

## 2015-06-10 DIAGNOSIS — Z23 Encounter for immunization: Secondary | ICD-10-CM | POA: Diagnosis not present

## 2015-06-26 ENCOUNTER — Ambulatory Visit: Payer: PRIVATE HEALTH INSURANCE | Admitting: Internal Medicine

## 2015-06-26 DIAGNOSIS — Z0289 Encounter for other administrative examinations: Secondary | ICD-10-CM

## 2015-09-12 ENCOUNTER — Other Ambulatory Visit: Payer: Self-pay | Admitting: Internal Medicine

## 2015-09-13 MED ORDER — PREDNISONE 10 MG PO TABS: ORAL_TABLET | ORAL | Status: DC

## 2015-09-13 NOTE — Addendum Note (Signed)
Addended by: Etheleen MayhewOX, Katai Marsico C on: 09/13/2015 09:29 AM   Modules accepted: Orders

## 2015-12-22 ENCOUNTER — Other Ambulatory Visit: Payer: Self-pay | Admitting: Internal Medicine

## 2016-03-04 ENCOUNTER — Other Ambulatory Visit: Payer: Self-pay | Admitting: *Deleted

## 2016-03-08 ENCOUNTER — Other Ambulatory Visit: Payer: Self-pay | Admitting: Internal Medicine

## 2016-03-28 ENCOUNTER — Other Ambulatory Visit: Payer: Self-pay | Admitting: *Deleted

## 2016-03-28 MED ORDER — TAMSULOSIN HCL 0.4 MG PO CAPS: ORAL_CAPSULE | ORAL | 0 refills | Status: DC

## 2016-04-01 ENCOUNTER — Telehealth: Payer: Self-pay | Admitting: Internal Medicine

## 2016-04-01 NOTE — Telephone Encounter (Signed)
Patient Name: Brent Turner  DOB: 1940-10-09    Initial Comment Caller states he has tingling and numbness is left arm and hand   Nurse Assessment  Nurse: Deatra James, RN, Corrie Dandy Date/Time (Eastern Time): 04/01/2016 1:51:04 PM  Confirm and document reason for call. If symptomatic, describe symptoms. You must click the next button to save text entered. ---Patient states he is having tingling and numbness is left arm and hand for a couple of weeks  Has the patient traveled out of the country within the last 30 days? ---Yes  Where have you traveled? ([place] for Ebola and Ebola guideline, [place], [place] for [employer]) ---"[place]"  Does the patient have any new or worsening symptoms? ---Yes  Will a triage be completed? ---Yes  Related visit to physician within the last 2 weeks? ---No  Does the PT have any chronic conditions? (i.e. diabetes, asthma, etc.) ---Yes  List chronic conditions. ---"gout"  Is this a behavioral health or substance abuse call? ---No     Guidelines    Guideline Title Affirmed Question Affirmed Notes  Neurologic Deficit [1] Tingling in hand (e.g., pins and needles) AND [2] after prolonged laying on arm AND [3] brief (now gone)    Final Disposition User   See PCP When Office is Open (within 3 days) Noe, RN, Corrie Dandy    Comments  Upgraded to be seen in the next 3 days since patient is having numbness off and on in the left hand and lower arm that seems to be positional   Referrals  REFERRED TO PCP OFFICE   Disagree/Comply: Comply

## 2016-04-04 ENCOUNTER — Ambulatory Visit (INDEPENDENT_AMBULATORY_CARE_PROVIDER_SITE_OTHER): Payer: Medicare Other | Admitting: Internal Medicine

## 2016-04-04 ENCOUNTER — Encounter: Payer: Self-pay | Admitting: Internal Medicine

## 2016-04-04 DIAGNOSIS — G5602 Carpal tunnel syndrome, left upper limb: Secondary | ICD-10-CM

## 2016-04-04 DIAGNOSIS — M109 Gout, unspecified: Secondary | ICD-10-CM | POA: Diagnosis not present

## 2016-04-04 DIAGNOSIS — M1990 Unspecified osteoarthritis, unspecified site: Secondary | ICD-10-CM | POA: Diagnosis not present

## 2016-04-04 MED ORDER — HYDROCODONE-ACETAMINOPHEN 5-325 MG PO TABS: 1.0000 | ORAL_TABLET | Freq: Two times a day (BID) | ORAL | 0 refills | Status: DC | PRN

## 2016-04-04 MED ORDER — PREDNISONE 10 MG PO TABS: ORAL_TABLET | ORAL | 1 refills | Status: DC

## 2016-04-04 NOTE — Progress Notes (Signed)
Pre visit review using our clinic review tool, if applicable. No additional management support is needed unless otherwise documented below in the visit note. 

## 2016-04-04 NOTE — Progress Notes (Signed)
Subjective:    Patient ID: Brent Turner, male    DOB: 13-Mar-1941, 75 y.o.   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  Seems better to him to keep raise dthe arm up with hand above the head leve in forward elevation motion.  No falls, trauma, fever; overall symptoms intermittent, nothing else makes better or worse.  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  Pt denies new neurological symptoms such as new headache, or facial or extremity weakness or numbness  Pt denies chest pain, increased sob or doe, wheezing, orthopnea, PND, increased LE swelling, palpitations, dizziness or syncope.   Pt denies polydipsia, polyuria, Past Medical History:  Diagnosis Date  . ED (erectile dysfunction)   . Gout   . HTN (hypertension)   . Vitamin D deficiency    No past surgical history on file.  reports that he has been smoking.  He has been smoking about 1.00 pack per day. He does not have any smokeless tobacco history on file. He reports that he drinks alcohol. He reports that he does not use drugs. family history includes Cancer (age of onset: 40) in his father; Stroke (age of onset: 4) in his mother. No Known Allergies Current Outpatient Prescriptions on File Prior to Visit  Medication Sig Dispense Refill  . allopurinol (ZYLOPRIM) 100 MG tablet TAKE ONE TABLET BY MOUTH DAILY 30 tablet 0  . amLODipine (NORVASC) 5 MG tablet Take 1 tablet (5 mg total) by mouth daily. 90 tablet 3  . Cholecalciferol 1000 UNITS tablet Take 1,000 Units by mouth daily.      Marland Kitchen ketoprofen (ORUDIS) 50 MG  capsule Take 1 capsule by mouth every 8 (eight) hours as needed.  3  . sildenafil (VIAGRA) 100 MG tablet Take 1 tablet (100 mg total) by mouth daily as needed. 30 tablet 4  . tamsulosin (FLOMAX) 0.4 MG CAPS capsule Take 1 capsule (0.4 mg total) by mouth daily. 90 capsule 3  . colchicine 0.6 MG tablet Take 1 tablet (0.6 mg total) by mouth 4 (four) times daily as needed (gout attack). For gout (Patient not taking: Reported on 04/04/2016) 60 tablet 3  . triamcinolone cream (KENALOG) 0.5 % Apply 1 application topically 2 (two) times daily. (Patient not taking: Reported on 04/04/2016) 30 g 3   No current facility-administered medications on file prior to visit.    Review of Systems  Constitutional: Negative for unusual diaphoresis or night sweats HENT: Negative for ear swelling or discharge Eyes: Negative for worsening visual haziness  Respiratory: Negative for choking and stridor.   Gastrointestinal: Negative for distension or worsening eructation Genitourinary: Negative for retention or change in urine volume.  Musculoskeletal: Negative for other MSK pain or swelling Skin: Negative for color change and worsening wound Neurological: Negative for tremors and numbness other than noted  Psychiatric/Behavioral: Negative for decreased concentration or agitation other than above       Objective:   Physical Exam BP 138/90   Pulse 83   Temp 97.7 F (36.5 C)   Wt  151 lb (68.5 kg)   SpO2 96%   BMI 20.48 kg/m  VS noted,  Constitutional: Pt appears in no apparent distress HENT: Head: NCAT.  Right Ear: External ear normal.  Left Ear: External ear normal.  Eyes: . Pupils are equal, round, and reactive to light. Conjunctivae and EOM are normal Neck: Normal range of motion. Neck supple.  Cardiovascular: Normal rate and regular rhythm.   Pulmonary/Chest: Effort normal and breath sounds without rales or wheezing.  Abd:  Soft, NT, ND, + BS Neurological: Pt is alert. Not confused , motor 5/5 intact and  sens/dtr intact to UE's Skin: Skin is warm. No rash, no LE edema Psychiatric: Pt behavior is normal. No agitation.  Right knee with small effusion, mild warm and tender, with reduced ROM    Assessment & Plan:

## 2016-04-04 NOTE — Patient Instructions (Signed)
OK to use the left wrist splint (OTC at the drug stores) at night to help the left hand tingling  Please take all new medication as prescribed - the prednisone  Please continue all other medications as before, and refills have been done if requested - the vicodin  Please have the pharmacy call with any other refills you may need.  Please keep your appointments with your specialists as you may have planned

## 2016-04-07 DIAGNOSIS — G5602 Carpal tunnel syndrome, left upper limb: Secondary | ICD-10-CM | POA: Insufficient documentation

## 2016-04-07 DIAGNOSIS — M1990 Unspecified osteoarthritis, unspecified site: Secondary | ICD-10-CM | POA: Insufficient documentation

## 2016-04-07 NOTE — Assessment & Plan Note (Signed)
Mild, for night time wrist splint use, consider hand surgury refrral

## 2016-04-07 NOTE — Assessment & Plan Note (Signed)
Mild, for predpac asd,  to f/u any worsening symptoms or concerns 

## 2016-04-07 NOTE — Assessment & Plan Note (Signed)
With recurring joint pain flares, for vicodin prn referral which he uses only very infrequently,  to f/u any worsening symptoms or concerns

## 2016-04-30 NOTE — Progress Notes (Addendum)
Subjective:   Brent Turner is a 75 y.o. male who presents for Medicare Annual/Subsequent preventive examination.   HRA assessment completed during this visit with [name]  The Patient was informed that the wellness visit is to identify future health risk and educate and initiate measures that can reduce risk for increased disease through the lifespan.    NO ROS; Medicare Wellness Visit PMH:  Vit d deficiency; medically managed  HTN: medically managed    Psychosocial (family hx; mother had storke; father had lung cancer)  Family support  Lives spouse; marriedx 57 years 5 children; grandkids 33 and 3 great grand [place]  Stay in [place] a couple of weeks and taking the baby with  Him  Owned a landscaping business   Had single family home in [place] and now has town home.   Tobacco - current every day smoker ? 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 She is independent    Fall hx; no Worked for [employer] x 35 years. Was in management;  Spent weekend at home  Given education on "Fall Prevention in the Home" for more safety tips the patient can apply as appropriate.   Personal safety issues reviewed:  1.  for risk such as safe community; yes;  2.  smoke detector 3.  firearms safety if applicable  4. protection when in the sun; have a cazebo out back  5. driving safety for seniors or any recent accidents. No    Depression; anxiety or mood issues assessed; no   Cognitive  screen completed; MMSE documented or assessed for failures or issues with the AD8 screen below   Ad8 score reviewed for issues; no problems   Issues making decisions; no  Less interest in hobbies / activities" no  Repeats questions, stories; family complaining: NO  Trouble using ordinary gadgets; microwave; computer: no  Forgets the month or year: no  Mismanaging finances: no  Missing apt: no but does write them down  Daily problems with thinking of memory NO Ad8 score is 0     Advanced Directive reviewed for completion or educated regarding [name] form; the electing a health care agent and completing the Living Will.   Assessed for Preventive Heath Gaps and completion;  Flu vaccine due; taken today  Td due / no tetanus hx/ allergic  Colonoscopy; had one; had one since 2002; Not sure what year To fup with [name]   Zostavax - does not want to take any shots today  PCV 13 - declines today; will discuss with Brent Turner   Eyes checked x 75 yo; went to one eye doctor Thought he had cataracts Will make apt   Future due dates reviewed with the patient for accuracy.    Established and updated Risk reviewed and appropriate referral made or health recommendations as appropriate based on individual needs and choices;  Risk for flu; took  flu vaccine Risk for undernourishment; drinks boost some;  Trying to eat peanut butter sandwich; has gained 3 lbs today Discussed smoking cessation and this may increase appetite  Will consider and resources given  Will discuss cologuard with Dr. Posey Turner  Current Care Team reviewed and updated  Cardiac Risk Factors include: advanced age (>1255men, 42>65 women);hypertension;male gender;smoking/ tobacco exposure     Objective:    Vitals: BP (!) 142/80   Pulse 78   Temp 98 F (36.7 C) (Oral)   Ht 6' (1.829 m)   Wt 152 lb 4 oz (69.1 kg)   SpO2 97%   BMI 20.65 kg/m   Body mass index is 20.65 kg/m.  Tobacco History  Smoking  Status  . Current Every Day Smoker  . Packs/day: 1.00  . Years: 50.00  Smokeless Tobacco  . Current User     Ready to quit: Not Answered Counseling given: Yes   Past Medical History:  Diagnosis Date  . ED (erectile dysfunction)   . Gout   . HTN (hypertension)   . Vitamin D deficiency    No past surgical history on file. Family History  Problem Relation Age of Onset  . Stroke Mother [id number]  . Cancer Father [id number]    lung   History  Sexual Activity  . Sexual activity: Not Currently    Outpatient Encounter Prescriptions as of 05/01/2016  Medication Sig  . allopurinol (ZYLOPRIM) 100 MG tablet TAKE ONE TABLET BY MOUTH DAILY  . amLODipine (NORVASC) 5 MG tablet Take 1 tablet (5 mg total) by mouth daily.  . Cholecalciferol 1000 UNITS tablet Take 1,000 Units by mouth daily.    . colchicine 0.6 MG tablet Take 1 tablet (0.6 mg total) by mouth 4 (four) times daily as needed (gout attack). For gout  . predniSONE (DELTASONE) 10 MG tablet TAKE 4 TABLETS BY MOUTH DAILY FOR TWO TO THREE DAYS AS NEEDED FOR GOUT ATTACK. TAKE AFTER MEALS  . sildenafil (VIAGRA) 100 MG tablet Take 1 tablet (100 mg total) by mouth daily as needed.  . tamsulosin (FLOMAX) 0.4 MG CAPS capsule Take 1 capsule (0.4 mg total) by mouth daily.  Marland Kitchen. triamcinolone cream (KENALOG) 0.5 % Apply 1 application topically 2 (two) times daily.  Marland Kitchen. HYDROcodone-acetaminophen (NORCO/VICODIN) 5-325 MG tablet Take 1-2 tablets by mouth 2 (two) times daily as needed for moderate pain or severe pain. (Patient not taking: Reported on 05/01/2016)  . ketoprofen (ORUDIS) 50 MG capsule Take 1 capsule by mouth every 8 (eight) hours as needed.   No facility-administered encounter medications on file as of 05/01/2016.     Activities of Daily Living In your present state of health, do you have any difficulty performing the following activities: 05/01/2016  Hearing? N  Vision? N  Difficulty concentrating or making decisions? N  Walking or climbing stairs?  N  Dressing or bathing? N  Doing errands, shopping? N  Preparing Food and eating ? N  Using the Toilet? N  In the past six months, have you accidently leaked urine? N  Do you have problems with loss of bowel control? N  Managing your Medications? N  Managing your Finances? N  Housekeeping or managing your Housekeeping? N  Some recent data might be hidden    Patient Care Team: Tresa GarterAleksei V [name], MD as PCP - General   Assessment:     Exercise Activities and Dietary recommendations Current Exercise Habits: Home exercise routine, Intensity: Moderate (stays busy; up and moving all day; also keeps 15 months  grandchild)  Goals    . Quit smoking / using tobacco          Could try to stop smoking Think about the benefits of quitting; may increase appetite as food will have more taste  Smoking;  Educated to avoid secondary smoke Smoking cessation at [facility]: [phone]  Meds may help; chatix (Varenicline); Zyban (Bupropion SR); Nicotine Replacement (gum; lozenges; patches; etc.)  30 pack yr smoking hx: Educated regarding LDCT (low dose CT) ; To discuss with MD at next fup. Also educated on AAA screening for men 65-75 who have smoked  [employer] quit line information give       Fall Risk Fall Risk  05/01/2016 02/24/2015  Falls in the past year? No No   Depression Screen PHQ 2/9 Scores 05/01/2016 02/24/2015  PHQ - 2 Score 0 0    Cognitive Testing MMSE - Mini Mental State Exam 05/01/2016  Not completed: (No Data)   Ad8 score 0  Immunization History  Administered Date(s) Administered  . Influenza Split 07/13/2012  . Influenza Whole 07/31/2010, 05/14/2011  . Influenza, High Dose Seasonal PF 07/16/2013, 05/01/2016  . Influenza,inj,Quad PF,36+ Mos 07/26/2014  . Pneumococcal Polysaccharide-23 01/28/2007   Screening Tests Health Maintenance  Topic Date Due  . TETANUS/TDAP  07/23/1960  . COLONOSCOPY  07/24/1991  . ZOSTAVAX  07/23/2001  . PNA vac Low Risk Adult (2 of 2 - PCV13)  01/28/2008  . INFLUENZA VACCINE  04/02/2016      Plan:     Did decide to take his high does flu shot today after being educating on waiting. Stated he got sick after the flu shot last year but did not take until November.   Will have an eye exam; Medicare and supplement should cover his 20%  Declines tetanus; states he had very bad reaction; will defer / allergy listed   Will discuss  Colonoscopy with Dr. Macario Golds. May consider the colo guard  Agreed to make apt with [name] for annual labs and review of hx;  Discussion of colo-guard or other  fup on Prevnar; declines today LDCT or AAA for smoking hx    During the course of the visit the patient was educated and counseled about the following appropriate screening and preventive services:   Vaccines to include Pneumoccal, Influenza, Hepatitis B, Td, Zostavax, HCV/ high does flu shot today  Electrocardiogram- 07/13/2012  Cardiovascular Disease/ bp med   Colorectal cancer screening/ deferred to Dr. Janalyn Shy  Diabetes screening/neg   Prostate Cancer Screening. Deferred for labs   Glaucoma screening/ to make eye apt  Nutrition counseling / encouraged to eat more Smoking cessation counseling/ educated on his resources  Patient Instructions (the written plan) was given to the patient.    Saharsh Sterling, RN  05/01/2016  Medical screening examination/treatment/procedure(s) were performed by non-physician practitioner and as supervising physician I was immediately available for consultation/collaboration. I agree with above. Sonda Primes, MD

## 2016-05-01 ENCOUNTER — Ambulatory Visit (INDEPENDENT_AMBULATORY_CARE_PROVIDER_SITE_OTHER): Payer: Medicare Other

## 2016-05-01 DIAGNOSIS — Z0000 Encounter for general adult medical examination without abnormal findings: Secondary | ICD-10-CM

## 2016-05-01 DIAGNOSIS — Z23 Encounter for immunization: Secondary | ICD-10-CM | POA: Diagnosis not present

## 2016-05-01 NOTE — Patient Instructions (Addendum)
Brent Turner , Thank you for taking time to come for your Medicare Wellness Visit. I appreciate your ongoing commitment to your health goals. Please review the following plan we discussed and let me know if I can assist you in the future.   Will consider eye exam soon   Declines tetanus; states he had very bad reaction; will defer   Got sick after the flu shot last year Keep in mind the flu shot is an inactivated vaccine and takes at least 2 weeks to build immunity. The flu virus can be dormant for 4 days prior to symptoms Taking the flu shot at the beginning of the season can reduce the risk for the entire community.   Will take the high does flu shot today which you had a like vaccine in 2014;     These are the goals we discussed: Goals    . Quit smoking / using tobacco          Could try to stop smoking Think about the benefits of quitting; may increase appetite as food will have more taste  Smoking;  Educated to avoid secondary smoke Smoking cessation at [facility]: [phone]  Meds may help; chatix (Varenicline); Zyban (Bupropion SR); Nicotine Replacement (gum; lozenges; patches; etc.)  30 pack yr smoking hx: Educated regarding LDCT (low dose CT) ; To discuss with MD at next fup. Also educated on AAA screening for men 65-75 who have smoked   quit line information give        This is a list of the screening recommended for you and due dates:  Health Maintenance  Topic Date Due  . Tetanus Vaccine  07/23/1960  . Colon Cancer Screening  07/24/1991  . Shingles Vaccine  07/23/2001  . Pneumonia vaccines (2 of 2 - PCV13) 01/28/2008  . Flu Shot  04/02/2016       Colonoscopy A colonoscopy is an exam to look at the entire large intestine (colon). This exam can help find problems such as tumors, polyps, inflammation, and areas of bleeding. The exam takes about 1 hour.  LET [facility] CARE PROVIDER KNOW ABOUT:   Any allergies you have.  All medicines you are taking,  including vitamins, herbs, eye drops, creams, and over-the-counter medicines.  Previous problems you or members of your family have had with the use of anesthetics.  Any blood disorders you have.  Previous surgeries you have had.  Medical conditions you have. RISKS AND COMPLICATIONS  Generally, this is a safe procedure. However, as with any procedure, complications can occur. Possible complications include:  Bleeding.  Tearing or rupture of the colon wall.  Reaction to medicines given during the exam.  Infection (rare). BEFORE THE PROCEDURE   Ask your health care provider about changing or stopping your regular medicines.  You may be prescribed an oral bowel prep. This involves drinking a large amount of medicated liquid, starting the day before your procedure. The liquid will cause you to have multiple loose stools until your stool is almost clear or light green. This cleans out your colon in preparation for the procedure.  Do not eat or drink anything else once you have started the bowel prep, unless your health care provider tells you it is safe to do so.  Arrange for someone to drive you home after the procedure. PROCEDURE   You will be given medicine to help you relax (sedative).  You will lie on your side with your knees bent.  A long, flexible tube with  a light and camera on the end (colonoscope) will be inserted through the rectum and into the colon. The camera sends video back to a computer screen as it moves through the colon. The colonoscope also releases carbon dioxide gas to inflate the colon. This helps your health care provider see the area better.  During the exam, your health care provider may take a small tissue sample (biopsy) to be examined under a microscope if any abnormalities are found.  The exam is finished when the entire colon has been viewed. AFTER THE PROCEDURE   Do not drive for 24 hours after the exam.  You may have a small amount of blood in  your stool.  You may pass moderate amounts of gas and have mild abdominal cramping or bloating. This is caused by the gas used to inflate your colon during the exam.  Ask when your test results will be ready and how you will get your results. Make sure you get your test results.   This information is not intended to replace advice given to you by your health care provider. Make sure you discuss any questions you have with your health care provider.   Document Released: 08/16/2000 Document Revised: 06/09/2013 Document Reviewed: 04/26/2013 Elsevier Interactive Patient Education 2016 [employer].   Fall Prevention in the Home  Falls can cause injuries. They can happen to people of all ages. There are many things you can do to make your home safe and to help prevent falls.  WHAT CAN I DO ON THE OUTSIDE OF MY HOME?  Regularly fix the edges of walkways and driveways and fix any cracks.  Remove anything that might make you trip as you walk through a door, such as a raised step or threshold.  Trim any bushes or trees on the path to your home.  Use bright outdoor lighting.  Clear any walking paths of anything that might make someone trip, such as rocks or tools.  Regularly check to see if handrails are loose or broken. Make sure that both sides of any steps have handrails.  Any raised decks and porches should have guardrails on the edges.  Have any leaves, snow, or ice cleared regularly.  Use sand or salt on walking paths during winter.  Clean up any spills in your garage right away. This includes oil or grease spills. WHAT CAN I DO IN THE BATHROOM?   Use night lights.  Install grab bars by the toilet and in the tub and shower. Do not use towel bars as grab bars.  Use non-skid mats or decals in the tub or shower.  If you need to sit down in the shower, use a plastic, non-slip stool.  Keep the floor dry. Clean up any water that spills on the floor as soon as it happens.  Remove  soap buildup in the tub or shower regularly.  Attach bath mats securely with double-sided non-slip rug tape.  Do not have throw rugs and other things on the floor that can make you trip. WHAT CAN I DO IN THE BEDROOM?  Use night lights.  Make sure that you have a light by your bed that is easy to reach.  Do not use any sheets or blankets that are too big for your bed. They should not hang down onto the floor.  Have a firm chair that has side arms. You can use this for support while you get dressed.  Do not have throw rugs and other things on the  floor that can make you trip. WHAT CAN I DO IN THE KITCHEN?  Clean up any spills right away.  Avoid walking on wet floors.  Keep items that you use a lot in easy-to-reach places.  If you need to reach something above you, use a strong step stool that has a grab bar.  Keep electrical cords out of the way.  Do not use floor polish or wax that makes floors slippery. If you must use wax, use non-skid floor wax.  Do not have throw rugs and other things on the floor that can make you trip. WHAT CAN I DO WITH MY STAIRS?  Do not leave any items on the stairs.  Make sure that there are handrails on both sides of the stairs and use them. Fix handrails that are broken or loose. Make sure that handrails are as long as the stairways.  Check any carpeting to make sure that it is firmly attached to the stairs. Fix any carpet that is loose or worn.  Avoid having throw rugs at the top or bottom of the stairs. If you do have throw rugs, attach them to the floor with carpet tape.  Make sure that you have a light switch at the top of the stairs and the bottom of the stairs. If you do not have them, ask someone to add them for you. WHAT ELSE CAN I DO TO HELP PREVENT FALLS?  Wear shoes that:  Do not have high heels.  Have rubber bottoms.  Are comfortable and fit you well.  Are closed at the toe. Do not wear sandals.  If you use a  stepladder:  Make sure that it is fully opened. Do not climb a closed stepladder.  Make sure that both sides of the stepladder are locked into place.  Ask someone to hold it for you, if possible.  Clearly mark and make sure that you can see:  Any grab bars or handrails.  First and last steps.  Where the edge of each step is.  Use tools that help you move around (mobility aids) if they are needed. These include:  Canes.  Walkers.  Scooters.  Crutches.  Turn on the lights when you go into a dark area. Replace any light bulbs as soon as they burn out.  Set up your furniture so you have a clear path. Avoid moving your furniture around.  If any of your floors are uneven, fix them.  If there are any pets around you, be aware of where they are.  Review your medicines with your doctor. Some medicines can make you feel dizzy. This can increase your chance of falling. Ask your doctor what other things that you can do to help prevent falls.   This information is not intended to replace advice given to you by your health care provider. Make sure you discuss any questions you have with your health care provider.   Document Released: 06/15/2009 Document Revised: 01/03/2015 Document Reviewed: 09/23/2014 Elsevier Interactive Patient Education 2016 [employer].  Health Maintenance, Male A healthy lifestyle and preventative care can promote health and wellness.  Maintain regular health, dental, and eye exams.  Eat a healthy diet. Foods like vegetables, fruits, whole grains, low-fat dairy products, and lean protein foods contain the nutrients you need and are low in calories. Decrease your intake of foods high in solid fats, added sugars, and salt. Get information about a proper diet from your health care provider, if necessary.  Regular physical exercise is  one of the most important things you can do for your health. Most adults should get at least 150 minutes of moderate-intensity  exercise (any activity that increases your heart rate and causes you to sweat) each week. In addition, most adults need muscle-strengthening exercises on 2 or more days a week.   Maintain a healthy weight. The body mass index (BMI) is a screening tool to identify possible weight problems. It provides an estimate of body fat based on height and weight. Your health care provider can find your BMI and can help you achieve or maintain a healthy weight. For males 20 years and older:  A BMI below 18.5 is considered underweight.  A BMI of 18.5 to 24.9 is normal.  A BMI of 25 to 29.9 is considered overweight.  A BMI of 30 and above is considered obese.  Maintain normal blood lipids and cholesterol by exercising and minimizing your intake of saturated fat. Eat a balanced diet with plenty of fruits and vegetables. Blood tests for lipids and cholesterol should begin at age 75 and be repeated every 5 years. If your lipid or cholesterol levels are high, you are over age 75, or you are at high risk for heart disease, you may need your cholesterol levels checked more frequently.Ongoing high lipid and cholesterol levels should be treated with medicines if diet and exercise are not working.  If you smoke, find out from your health care provider how to quit. If you do not use tobacco, do not start.  Lung cancer screening is recommended for adults aged 55-80 years who are at high risk for developing lung cancer because of a history of smoking. A yearly low-dose CT scan of the lungs is recommended for people who have at least a 30-pack-year history of smoking and are current smokers or have quit within the past 15 years. A pack year of smoking is smoking an average of 1 pack of cigarettes a day for 1 year (for example, a 30-pack-year history of smoking could mean smoking 1 pack a day for 30 years or 2 packs a day for 15 years). Yearly screening should continue until the smoker has stopped smoking for at least 15  years. Yearly screening should be stopped for people who develop a health problem that would prevent them from having lung cancer treatment.  If you choose to drink alcohol, do not have more than 2 drinks per day. One drink is considered to be 12 oz (360 mL) of beer, 5 oz (150 mL) of wine, or 1.5 oz (45 mL) of liquor.  Avoid the use of street drugs. Do not share needles with anyone. Ask for help if you need support or instructions about stopping the use of drugs.  High blood pressure causes heart disease and increases the risk of stroke. High blood pressure is more likely to develop in:  People who have blood pressure in the end of the normal range (100-139/85-89 mm Hg).  People who are overweight or obese.  People who are African American.  If you are 618-75 years of age, have your blood pressure checked every 3-5 years. If you are 75 years of age or older, have your blood pressure checked every year. You should have your blood pressure measured twice--once when you are at a hospital or clinic, and once when you are not at a hospital or clinic. Record the average of the two measurements. To check your blood pressure when you are not at a hospital or clinic, you  can use:  An automated blood pressure machine at a pharmacy.  A home blood pressure monitor.  If you are 1-57 years old, ask your health care provider if you should take aspirin to prevent heart disease.  Diabetes screening involves taking a blood sample to check your fasting blood sugar level. This should be done once every 3 years after age 22 if you are at a normal weight and without risk factors for diabetes. Testing should be considered at a younger age or be carried out more frequently if you are overweight and have at least 1 risk factor for diabetes.  Colorectal cancer can be detected and often prevented. Most routine colorectal cancer screening begins at the age of 26 and continues through age 59. However, your health care  provider may recommend screening at an earlier age if you have risk factors for colon cancer. On a yearly basis, your health care provider may provide home test kits to check for hidden blood in the stool. A small camera at the end of a tube may be used to directly examine the colon (sigmoidoscopy or colonoscopy) to detect the earliest forms of colorectal cancer. Talk to your health care provider about this at age 18 when routine screening begins. A direct exam of the colon should be repeated every 5-10 years through age 74, unless early forms of precancerous polyps or small growths are found.  People who are at an increased risk for hepatitis B should be screened for this virus. You are considered at high risk for hepatitis B if:  You were born in a country where hepatitis B occurs often. Talk with your health care provider about which countries are considered high risk.  Your parents were born in a high-risk country and you have not received a shot to protect against hepatitis B (hepatitis B vaccine).  You have HIV or AIDS.  You use needles to inject street drugs.  You live with, or have sex with, someone who has hepatitis B.  You are a man who has sex with other men (MSM).  You get hemodialysis treatment.  You take certain medicines for conditions like cancer, organ transplantation, and autoimmune conditions.  Hepatitis C blood testing is recommended for all people born from [birth year] through 1965 and any individual with known risk factors for hepatitis C.  Healthy men should no longer receive prostate-specific antigen (PSA) blood tests as part of routine cancer screening. Talk to your health care provider about prostate cancer screening.  Testicular cancer screening is not recommended for adolescents or adult males who have no symptoms. Screening includes self-exam, a health care provider exam, and other screening tests. Consult with your health care provider about any symptoms you have or any  concerns you have about testicular cancer.  Practice safe sex. Use condoms and avoid high-risk sexual practices to reduce the spread of sexually transmitted infections (STIs).  You should be screened for STIs, including gonorrhea and chlamydia if:  You are sexually active and are younger than 24 years.  You are older than 24 years, and your health care provider tells you that you are at risk for this type of infection.  Your sexual activity has changed since you were last screened, and you are at an increased risk for chlamydia or gonorrhea. Ask your health care provider if you are at risk.  If you are at risk of being infected with HIV, it is recommended that you take a prescription medicine daily to prevent HIV infection. This  is called pre-exposure prophylaxis (PrEP). You are considered at risk if:  You are a man who has sex with other men (MSM).  You are a heterosexual man who is sexually active with multiple partners.  You take drugs by injection.  You are sexually active with a partner who has HIV.  Talk with your health care provider about whether you are at high risk of being infected with HIV. If you choose to begin PrEP, you should first be tested for HIV. You should then be tested every 3 months for as long as you are taking PrEP.  Use sunscreen. Apply sunscreen liberally and repeatedly throughout the day. You should seek shade when your shadow is shorter than you. Protect yourself by wearing long sleeves, pants, a wide-brimmed hat, and sunglasses year round whenever you are outdoors.  Tell your health care provider of new moles or changes in moles, especially if there is a change in shape or color. Also, tell your health care provider if a mole is larger than the size of a pencil eraser.  A one-time screening for abdominal aortic aneurysm (AAA) and surgical repair of large AAAs by ultrasound is recommended for men aged 65-75 years who are current or former smokers.  Stay  current with your vaccines (immunizations).   This information is not intended to replace advice given to you by your health care provider. Make sure you discuss any questions you have with your health care provider.   Document Released: 02/15/2008 Document Revised: 09/09/2014 Document Reviewed: 01/14/2011 Elsevier Interactive Patient Education [employer]

## 2016-05-02 ENCOUNTER — Other Ambulatory Visit: Payer: Self-pay | Admitting: Internal Medicine

## 2016-05-26 ENCOUNTER — Other Ambulatory Visit: Payer: Self-pay | Admitting: Internal Medicine

## 2016-05-29 ENCOUNTER — Encounter: Payer: Self-pay | Admitting: Internal Medicine

## 2016-05-29 ENCOUNTER — Ambulatory Visit (INDEPENDENT_AMBULATORY_CARE_PROVIDER_SITE_OTHER): Payer: Medicare Other | Admitting: Internal Medicine

## 2016-05-29 VITALS — BP 130/70 | HR 88 | Wt 153.0 lb

## 2016-05-29 DIAGNOSIS — N320 Bladder-neck obstruction: Secondary | ICD-10-CM

## 2016-05-29 DIAGNOSIS — I10 Essential (primary) hypertension: Secondary | ICD-10-CM

## 2016-05-29 DIAGNOSIS — M1000 Idiopathic gout, unspecified site: Secondary | ICD-10-CM

## 2016-05-29 DIAGNOSIS — Z0000 Encounter for general adult medical examination without abnormal findings: Secondary | ICD-10-CM

## 2016-05-29 DIAGNOSIS — E785 Hyperlipidemia, unspecified: Secondary | ICD-10-CM

## 2016-05-29 DIAGNOSIS — Z1211 Encounter for screening for malignant neoplasm of colon: Secondary | ICD-10-CM

## 2016-05-29 DIAGNOSIS — H538 Other visual disturbances: Secondary | ICD-10-CM

## 2016-05-29 MED ORDER — PREDNISONE 10 MG PO TABS: ORAL_TABLET | ORAL | 1 refills | Status: DC

## 2016-05-29 MED ORDER — TAMSULOSIN HCL 0.4 MG PO CAPS: 0.4000 mg | ORAL_CAPSULE | Freq: Every day | ORAL | 3 refills | Status: DC

## 2016-05-29 MED ORDER — TRIAMCINOLONE ACETONIDE 0.5 % EX CREA: 1.0000 "application " | TOPICAL_CREAM | Freq: Two times a day (BID) | CUTANEOUS | 3 refills | Status: DC

## 2016-05-29 MED ORDER — AMLODIPINE BESYLATE 5 MG PO TABS: 5.0000 mg | ORAL_TABLET | Freq: Every day | ORAL | 3 refills | Status: DC

## 2016-05-29 MED ORDER — HYDROCODONE-ACETAMINOPHEN 5-325 MG PO TABS: 1.0000 | ORAL_TABLET | Freq: Two times a day (BID) | ORAL | 0 refills | Status: DC | PRN

## 2016-05-29 MED ORDER — ALLOPURINOL 100 MG PO TABS: 100.0000 mg | ORAL_TABLET | Freq: Every day | ORAL | 3 refills | Status: DC

## 2016-05-29 NOTE — Assessment & Plan Note (Signed)
Ophth ref 

## 2016-05-29 NOTE — Patient Instructions (Signed)

## 2016-05-29 NOTE — Progress Notes (Signed)
Subjective:  Patient ID: Brent Turner, male    DOB: November 02, 1940  Age: 75 y.o. MRN: [id number]  CC: No chief complaint on file.   HPI Brent Turner presents for well exam. C/o blurred vision  Outpatient Medications Prior to Visit  Medication Sig Dispense Refill  . allopurinol (ZYLOPRIM) 100 MG tablet TAKE ONE TABLET BY MOUTH DAILY (PT NEEDS APPT) 30 tablet 0  . amLODipine (NORVASC) 5 MG tablet Take 1 tablet (5 mg total) by mouth daily. 90 tablet 3  . Cholecalciferol 1000 UNITS tablet Take 1,000 Units by mouth daily.      . colchicine 0.6 MG tablet Take 1 tablet (0.6 mg total) by mouth 4 (four) times daily as needed (gout attack). For gout 60 tablet 3  . HYDROcodone-acetaminophen (NORCO/VICODIN) 5-325 MG tablet Take 1-2 tablets by mouth 2 (two) times daily as needed for moderate pain or severe pain. 100 tablet 0  . ketoprofen (ORUDIS) 50 MG capsule Take 1 capsule by mouth every 8 (eight) hours as needed.  3  . predniSONE (DELTASONE) 10 MG tablet TAKE 4 TABLETS BY MOUTH DAILY FOR TWO TO THREE DAYS AS NEEDED FOR GOUT ATTACK. TAKE AFTER MEALS 60 tablet 1  . sildenafil (VIAGRA) 100 MG tablet Take 1 tablet (100 mg total) by mouth daily as needed. 30 tablet 4  . tamsulosin (FLOMAX) 0.4 MG CAPS capsule TAKE ONE CAPSULE BY MOUTH DAILY (PHYSICAL DUE) 30 capsule 0  . triamcinolone cream (KENALOG) 0.5 % Apply 1 application topically 2 (two) times daily. 30 g 3   No facility-administered medications prior to visit.     ROS Review of Systems  Constitutional: Negative for appetite change, fatigue and unexpected weight change.  HENT: Negative for congestion, nosebleeds, sneezing, sore throat and trouble swallowing.   Eyes: Negative for itching and visual disturbance.  Respiratory: Negative for cough.   Cardiovascular: Negative for chest pain, palpitations and leg swelling.  Gastrointestinal: Negative for abdominal distention, blood in stool, diarrhea and nausea.  Genitourinary: Negative for  frequency and hematuria.  Musculoskeletal: Negative for back pain, gait problem, joint swelling and neck pain.  Skin: Negative for rash.  Neurological: Negative for dizziness, tremors, speech difficulty and weakness.  Psychiatric/Behavioral: Negative for agitation, dysphoric mood and sleep disturbance. The patient is not nervous/anxious.     Objective:  BP 130/70   Pulse 88   Wt 153 lb (69.4 kg)   SpO2 95%   BMI 20.75 kg/m   BP Readings from Last 3 Encounters:  05/29/16 130/70  05/01/16 (!) 142/80  04/04/16 138/90    Wt Readings from Last 3 Encounters:  05/29/16 153 lb (69.4 kg)  05/01/16 152 lb 4 oz (69.1 kg)  04/04/16 151 lb (68.5 kg)    Physical Exam  Constitutional: He is oriented to person, place, and time. He appears well-developed and well-nourished. No distress.  HENT:  Head: Normocephalic and atraumatic.  Right Ear: External ear normal.  Left Ear: External ear normal.  Nose: Nose normal.  Mouth/Throat: Oropharynx is clear and moist. No oropharyngeal exudate.  Eyes: Conjunctivae and EOM are normal. Pupils are equal, round, and reactive to light. Right eye exhibits no discharge. Left eye exhibits no discharge. No scleral icterus.  Neck: Normal range of motion. Neck supple. No JVD present. No tracheal deviation present. No thyromegaly present.  Cardiovascular: Normal rate, regular rhythm, normal heart sounds and intact distal pulses.  Exam reveals no gallop and no friction rub.   No murmur heard. Pulmonary/Chest: Effort normal and  breath sounds normal. No stridor. No respiratory distress. He has no wheezes. He has no rales. He exhibits no tenderness.  Abdominal: Soft. Bowel sounds are normal. He exhibits no distension and no mass. There is no tenderness. There is no rebound and no guarding.  Genitourinary: Rectum normal and penis normal. Rectal exam shows guaiac negative stool. No penile tenderness.  Musculoskeletal: Normal range of motion. He exhibits no edema or  tenderness.  Lymphadenopathy:    He has no cervical adenopathy.  Neurological: He is alert and oriented to person, place, and time. He has normal reflexes. No cranial nerve deficit. He exhibits normal muscle tone. Coordination normal.  Skin: Skin is warm and dry. No rash noted. He is not diaphoretic. No erythema. No pallor.  Psychiatric: He has a normal mood and affect. His behavior is normal. Judgment and thought content normal.  prostate 1+  Lab Results  Component Value Date   WBC 11.8 (H) 07/16/2013   HGB 15.6 07/16/2013   HCT 46.7 07/16/2013   PLT 303.0 07/16/2013   GLUCOSE 98 07/26/2014   CHOL 245 (H) 07/16/2013   TRIG 255.0 (H) 07/16/2013   HDL 38.40 (L) 07/16/2013   LDLDIRECT 106.6 07/16/2013   ALT 18 07/26/2014   AST 21 07/26/2014   NA 144 07/26/2014   K 4.4 07/26/2014   CL 103 07/26/2014   CREATININE 1.0 07/26/2014   BUN 11 07/26/2014   CO2 30 07/26/2014   TSH 1.55 07/26/2014   PSA 2.43 07/26/2014   HGBA1C 7.4 (H) 01/28/2007    No results found.  Assessment & Plan:   There are no diagnoses linked to this encounter. I am having [name] maintain his Cholecalciferol, triamcinolone cream, amLODipine, colchicine, ketoprofen, sildenafil, HYDROcodone-acetaminophen, predniSONE, allopurinol, and tamsulosin.  No orders of the defined types were placed in this encounter.    Follow-up: No Follow-up on file.  Sonda PrimesAlex Ruhee Enck, MD

## 2016-05-29 NOTE — Assessment & Plan Note (Signed)
Recurrent. Better on Allopurinol Prednisone Norco prn  Potential benefits of a long term opioids use as well as potential risks (i.e. addiction risk, apnea etc) and complications (i.e. Somnolence, constipation and others) were explained to the patient and were aknowledged.

## 2016-05-29 NOTE — Progress Notes (Signed)
Pre visit review using our clinic review tool, if applicable. No additional management support is needed unless otherwise documented below in the visit note. 

## 2016-05-29 NOTE — Assessment & Plan Note (Signed)

## 2016-05-29 NOTE — Assessment & Plan Note (Signed)
Amlodipine.

## 2016-05-30 ENCOUNTER — Other Ambulatory Visit (INDEPENDENT_AMBULATORY_CARE_PROVIDER_SITE_OTHER): Payer: Medicare Other

## 2016-05-30 ENCOUNTER — Other Ambulatory Visit: Payer: Self-pay | Admitting: Internal Medicine

## 2016-05-30 DIAGNOSIS — N320 Bladder-neck obstruction: Secondary | ICD-10-CM | POA: Diagnosis not present

## 2016-05-30 DIAGNOSIS — E785 Hyperlipidemia, unspecified: Secondary | ICD-10-CM | POA: Diagnosis not present

## 2016-05-30 DIAGNOSIS — I10 Essential (primary) hypertension: Secondary | ICD-10-CM | POA: Diagnosis not present

## 2016-05-30 DIAGNOSIS — M1000 Idiopathic gout, unspecified site: Secondary | ICD-10-CM | POA: Diagnosis not present

## 2016-05-30 DIAGNOSIS — Z0000 Encounter for general adult medical examination without abnormal findings: Secondary | ICD-10-CM

## 2016-05-30 LAB — CBC WITH DIFFERENTIAL/PLATELET
BASOS PCT: 0.6 % (ref 0.0–3.0)
Basophils Absolute: 0.1 10*3/uL (ref 0.0–0.1)
EOS ABS: 0.2 10*3/uL (ref 0.0–0.7)
Eosinophils Relative: 1.6 % (ref 0.0–5.0)
HEMATOCRIT: 45 % (ref 39.0–52.0)
Hemoglobin: 15.4 g/dL (ref 13.0–17.0)
LYMPHS PCT: 39 % (ref 12.0–46.0)
Lymphs Abs: 4.9 10*3/uL — ABNORMAL HIGH (ref 0.7–4.0)
MCHC: 34.2 g/dL (ref 30.0–36.0)
MCV: 99.1 fl (ref 78.0–100.0)
MONOS PCT: 6.2 % (ref 3.0–12.0)
Monocytes Absolute: 0.8 10*3/uL (ref 0.1–1.0)
NEUTROS ABS: 6.7 10*3/uL (ref 1.4–7.7)
Neutrophils Relative %: 52.6 % (ref 43.0–77.0)
PLATELETS: 259 10*3/uL (ref 150.0–400.0)
RBC: 4.55 Mil/uL (ref 4.22–5.81)
RDW: 14.3 % (ref 11.5–15.5)
WBC: 12.7 10*3/uL — ABNORMAL HIGH (ref 4.0–10.5)

## 2016-05-30 LAB — URINALYSIS
BILIRUBIN URINE: NEGATIVE
Hgb urine dipstick: NEGATIVE
KETONES UR: NEGATIVE
Leukocytes, UA: NEGATIVE
Nitrite: NEGATIVE
PH: 6 (ref 5.0–8.0)
Specific Gravity, Urine: <=1.005 — AB (ref 1.000–1.030)
TOTAL PROTEIN, URINE-UPE24: NEGATIVE
Urine Glucose: NEGATIVE
Urobilinogen, UA: 0.2 (ref 0.0–1.0)

## 2016-05-30 LAB — BASIC METABOLIC PANEL
BUN: 12 mg/dL (ref 6–23)
CALCIUM: 9.1 mg/dL (ref 8.4–10.5)
CO2: 30 mEq/L (ref 19–32)
Chloride: 104 mEq/L (ref 96–112)
Creatinine, Ser: 0.98 mg/dL (ref 0.40–1.50)
GFR: 79.28 mL/min (ref >60.00)
GLUCOSE: 118 mg/dL — AB (ref 70–99)
Potassium: 4.3 mEq/L (ref 3.5–5.1)
Sodium: 140 mEq/L (ref 135–145)

## 2016-05-30 LAB — LIPID PANEL
CHOL/HDL RATIO: 4
Cholesterol: 251 mg/dL — ABNORMAL HIGH (ref 0–200)
HDL: 59.5 mg/dL (ref >39.00)
NONHDL: 191.9
Triglycerides: 257 mg/dL — ABNORMAL HIGH (ref 0.0–149.0)
VLDL: 51.4 mg/dL — ABNORMAL HIGH (ref 0.0–40.0)

## 2016-05-30 LAB — HEPATIC FUNCTION PANEL
ALT: 24 U/L (ref 0–53)
AST: 17 U/L (ref 0–37)
Albumin: 3.8 g/dL (ref 3.5–5.2)
Alkaline Phosphatase: 48 U/L (ref 39–117)
Bilirubin, Direct: 0.1 mg/dL (ref 0.0–0.3)
Total Bilirubin: 0.6 mg/dL (ref 0.2–1.2)
Total Protein: 6.5 g/dL (ref 6.0–8.3)

## 2016-05-30 LAB — TSH: TSH: 1.61 u[IU]/mL (ref 0.35–4.50)

## 2016-05-30 LAB — LDL CHOLESTEROL, DIRECT: Direct LDL: 157 mg/dL

## 2016-05-30 LAB — PSA: PSA: 3 ng/mL (ref 0.10–4.00)

## 2016-05-30 LAB — URIC ACID: URIC ACID, SERUM: 5.4 mg/dL (ref 4.0–7.8)

## 2016-06-18 DIAGNOSIS — H40003 Preglaucoma, unspecified, bilateral: Secondary | ICD-10-CM | POA: Diagnosis not present

## 2016-06-18 DIAGNOSIS — H25013 Cortical age-related cataract, bilateral: Secondary | ICD-10-CM | POA: Diagnosis not present

## 2016-06-18 DIAGNOSIS — H2513 Age-related nuclear cataract, bilateral: Secondary | ICD-10-CM | POA: Diagnosis not present

## 2016-06-18 DIAGNOSIS — Z8249 Family history of ischemic heart disease and other diseases of the circulatory system: Secondary | ICD-10-CM | POA: Diagnosis not present

## 2016-06-18 DIAGNOSIS — H5703 Miosis: Secondary | ICD-10-CM | POA: Diagnosis not present

## 2016-06-18 DIAGNOSIS — H5203 Hypermetropia, bilateral: Secondary | ICD-10-CM | POA: Diagnosis not present

## 2016-06-18 DIAGNOSIS — H11153 Pinguecula, bilateral: Secondary | ICD-10-CM | POA: Diagnosis not present

## 2016-06-18 DIAGNOSIS — Z833 Family history of diabetes mellitus: Secondary | ICD-10-CM | POA: Diagnosis not present

## 2016-06-18 DIAGNOSIS — H524 Presbyopia: Secondary | ICD-10-CM | POA: Diagnosis not present

## 2016-07-02 DIAGNOSIS — H2511 Age-related nuclear cataract, right eye: Secondary | ICD-10-CM | POA: Diagnosis not present

## 2016-07-02 DIAGNOSIS — H25011 Cortical age-related cataract, right eye: Secondary | ICD-10-CM | POA: Diagnosis not present

## 2016-07-04 DIAGNOSIS — H5703 Miosis: Secondary | ICD-10-CM | POA: Insufficient documentation

## 2016-07-08 DIAGNOSIS — H5703 Miosis: Secondary | ICD-10-CM | POA: Diagnosis not present

## 2016-07-08 DIAGNOSIS — H2511 Age-related nuclear cataract, right eye: Secondary | ICD-10-CM | POA: Diagnosis not present

## 2016-07-08 DIAGNOSIS — H25011 Cortical age-related cataract, right eye: Secondary | ICD-10-CM | POA: Diagnosis not present

## 2016-07-08 DIAGNOSIS — M109 Gout, unspecified: Secondary | ICD-10-CM | POA: Diagnosis not present

## 2016-07-08 DIAGNOSIS — H2181 Floppy iris syndrome: Secondary | ICD-10-CM | POA: Diagnosis not present

## 2016-07-08 DIAGNOSIS — H5709 Other anomalies of pupillary function: Secondary | ICD-10-CM | POA: Diagnosis not present

## 2016-07-17 DIAGNOSIS — H25012 Cortical age-related cataract, left eye: Secondary | ICD-10-CM | POA: Diagnosis not present

## 2016-07-17 DIAGNOSIS — H2512 Age-related nuclear cataract, left eye: Secondary | ICD-10-CM | POA: Diagnosis not present

## 2016-07-21 DIAGNOSIS — H25012 Cortical age-related cataract, left eye: Secondary | ICD-10-CM | POA: Insufficient documentation

## 2016-07-21 DIAGNOSIS — H2512 Age-related nuclear cataract, left eye: Secondary | ICD-10-CM | POA: Insufficient documentation

## 2016-07-22 DIAGNOSIS — H2181 Floppy iris syndrome: Secondary | ICD-10-CM | POA: Diagnosis not present

## 2016-07-22 DIAGNOSIS — H5709 Other anomalies of pupillary function: Secondary | ICD-10-CM | POA: Diagnosis not present

## 2016-07-22 DIAGNOSIS — H5703 Miosis: Secondary | ICD-10-CM | POA: Diagnosis not present

## 2016-07-22 DIAGNOSIS — H2512 Age-related nuclear cataract, left eye: Secondary | ICD-10-CM | POA: Diagnosis not present

## 2016-07-22 DIAGNOSIS — H25012 Cortical age-related cataract, left eye: Secondary | ICD-10-CM | POA: Diagnosis not present

## 2016-08-22 ENCOUNTER — Other Ambulatory Visit: Payer: Self-pay | Admitting: *Deleted

## 2016-08-22 MED ORDER — TAMSULOSIN HCL 0.4 MG PO CAPS: 0.4000 mg | ORAL_CAPSULE | Freq: Every day | ORAL | 2 refills | Status: DC

## 2016-08-22 MED ORDER — ALLOPURINOL 100 MG PO TABS: ORAL_TABLET | ORAL | 2 refills | Status: DC

## 2016-10-27 ENCOUNTER — Other Ambulatory Visit: Payer: Self-pay | Admitting: Internal Medicine

## 2016-10-28 NOTE — Telephone Encounter (Addendum)
Forwarding msg to Dr. Posey ReaPlotnikov for approval on the Prednisone...Raechel Chute/LMB

## 2016-10-28 NOTE — Addendum Note (Signed)
Addended by: Deatra JamesBRAND, LUCY M on: 10/28/2016 01:23 PM   Modules accepted: Orders

## 2016-10-28 NOTE — Telephone Encounter (Signed)
Pt said that he is having a ghout attack and said that he just had his CPE.  He wants to know why he has to come in?

## 2016-10-30 MED ORDER — PREDNISONE 10 MG PO TABS: ORAL_TABLET | ORAL | 0 refills | Status: DC

## 2016-10-30 NOTE — Addendum Note (Signed)
Addended by: Deatra JamesBRAND, Antrell Tipler M on: 10/30/2016 11:02 AM   Modules accepted: Orders

## 2016-10-30 NOTE — Telephone Encounter (Signed)
OK to ref Prednisone Thx

## 2016-10-30 NOTE — Addendum Note (Signed)
Addended by: Tresa GarterPLOTNIKOV, Sokha Craker V on: 10/30/2016 12:59 AM   Modules accepted: Orders

## 2016-10-30 NOTE — Telephone Encounter (Signed)
Sent refill to [facility] 

## 2016-11-16 ENCOUNTER — Other Ambulatory Visit: Payer: Self-pay | Admitting: Internal Medicine

## 2016-11-27 ENCOUNTER — Ambulatory Visit: Payer: Self-pay | Admitting: Internal Medicine

## 2016-11-27 DIAGNOSIS — Z0289 Encounter for other administrative examinations: Secondary | ICD-10-CM

## 2017-01-06 ENCOUNTER — Encounter: Payer: Self-pay | Admitting: Internal Medicine

## 2017-03-20 ENCOUNTER — Telehealth: Payer: Self-pay | Admitting: Internal Medicine

## 2017-03-25 NOTE — Telephone Encounter (Signed)
Patient calling back in regard to prednisone refill.  Please follow up at [phone]. I have made appt for patient but he is requesting medication as soon as possible.

## 2017-03-26 ENCOUNTER — Ambulatory Visit: Payer: Medicare Other | Admitting: Family Medicine

## 2017-03-26 NOTE — Telephone Encounter (Addendum)
Patient has moved appt from today to when Dr. Macario GoldsPlot gets back in town.  Kathie RhodesBetty, can you close?

## 2017-04-08 ENCOUNTER — Encounter: Payer: Self-pay | Admitting: Internal Medicine

## 2017-04-08 ENCOUNTER — Ambulatory Visit (INDEPENDENT_AMBULATORY_CARE_PROVIDER_SITE_OTHER): Payer: Medicare Other | Admitting: Internal Medicine

## 2017-04-08 DIAGNOSIS — I10 Essential (primary) hypertension: Secondary | ICD-10-CM

## 2017-04-08 DIAGNOSIS — E559 Vitamin D deficiency, unspecified: Secondary | ICD-10-CM

## 2017-04-08 DIAGNOSIS — M1000 Idiopathic gout, unspecified site: Secondary | ICD-10-CM

## 2017-04-08 MED ORDER — AMLODIPINE BESYLATE 5 MG PO TABS: 5.0000 mg | ORAL_TABLET | Freq: Every day | ORAL | 3 refills | Status: DC

## 2017-04-08 MED ORDER — ALLOPURINOL 300 MG PO TABS: 300.0000 mg | ORAL_TABLET | Freq: Every day | ORAL | 11 refills | Status: DC

## 2017-04-08 MED ORDER — TAMSULOSIN HCL 0.4 MG PO CAPS: 0.4000 mg | ORAL_CAPSULE | Freq: Every day | ORAL | 2 refills | Status: DC

## 2017-04-08 MED ORDER — HYDROCODONE-ACETAMINOPHEN 5-325 MG PO TABS: 1.0000 | ORAL_TABLET | Freq: Two times a day (BID) | ORAL | 0 refills | Status: DC | PRN

## 2017-04-08 MED ORDER — COLCHICINE 0.6 MG PO TABS: 0.6000 mg | ORAL_TABLET | Freq: Four times a day (QID) | ORAL | 3 refills | Status: DC | PRN

## 2017-04-08 NOTE — Progress Notes (Signed)
Subjective:  Patient ID: Brent Turner, male    DOB: 1941-02-12  Age: 76 y.o. MRN: [id number]  CC: No chief complaint on file.   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 Prednisone helps F/u HTN, BPH   Outpatient Medications Prior to Visit  Medication Sig Dispense Refill  . allopurinol (ZYLOPRIM) 100 MG tablet TAKE ONE TABLET BY MOUTH DAILY (PT NEEDS APPT) 90 tablet 2  . amLODipine (NORVASC) 5 MG tablet Take 1 tablet (5 mg total) by mouth daily. 90 tablet 3  . Cholecalciferol 1000 UNITS tablet Take 1,000 Units by mouth daily.      . colchicine 0.6 MG tablet Take 1 tablet (0.6 mg total) by mouth 4 (four) times daily as needed (gout attack). For gout 60 tablet 3  . HYDROcodone-acetaminophen (NORCO/VICODIN) 5-325 MG tablet Take 1-2 tablets by mouth 2 (two) times daily as needed for moderate pain or severe pain. 100 tablet 0  . ketoprofen (ORUDIS) 50 MG capsule Take 1 capsule by mouth every 8 (eight) hours as needed.  3  . predniSONE (DELTASONE) 10 MG tablet TAKE 4 TABLETS BY MOUTH DAILY FOR 2-3 DAYS AS NEEDED FOR GOUT ATTACK. TAKE AFTER MEALS 60 tablet 0  . sildenafil (VIAGRA) 100 MG tablet Take 1 tablet (100 mg total) by mouth daily as needed. 30 tablet 4  . tamsulosin (FLOMAX) 0.4 MG CAPS capsule Take 1 capsule (0.4 mg total) by mouth daily. 90 capsule 2  . triamcinolone cream (KENALOG) 0.5 % Apply 1 application topically 2 (two) times daily. 30 g 3   No facility-administered medications prior to visit.     ROS Review of Systems  Constitutional: Negative for appetite change, fatigue and unexpected weight change.  HENT: Negative for congestion, nosebleeds, sneezing, sore throat and trouble swallowing.   Eyes: Negative for itching and visual disturbance.  Respiratory: Negative for cough.   Cardiovascular: Negative for chest pain, palpitations and leg swelling.  Gastrointestinal: Negative for abdominal distention, blood in stool, diarrhea  and nausea.  Genitourinary: Negative for frequency and hematuria.  Musculoskeletal: Positive for arthralgias. Negative for back pain, gait problem, joint swelling and neck pain.  Skin: Negative for rash.  Neurological: Negative for dizziness, tremors, speech difficulty and weakness.  Psychiatric/Behavioral: Negative for agitation, dysphoric mood, sleep disturbance and suicidal ideas. The patient is not nervous/anxious.     Objective:  BP 128/74 (BP Location: Left Arm, Patient Position: Sitting, Cuff Size: Normal)   Pulse 81   Temp 98.1 F (36.7 C) (Oral)   Ht 6' (1.829 m)   Wt 152 lb (68.9 kg)   SpO2 98%   BMI 20.61 kg/m   BP Readings from Last 3 Encounters:  04/08/17 128/74  05/29/16 130/70  05/01/16 (!) 142/80    Wt Readings from Last 3 Encounters:  04/08/17 152 lb (68.9 kg)  05/29/16 153 lb (69.4 kg)  05/01/16 152 lb 4 oz (69.1 kg)    Physical Exam  Constitutional: He is oriented to person, place, and time. He appears well-developed. No distress.  NAD  HENT:  Mouth/Throat: Oropharynx is clear and moist.  Eyes: Pupils are equal, round, and reactive to light. Conjunctivae are normal.  Neck: Normal range of motion. No JVD present. No thyromegaly present.  Cardiovascular: Normal rate, regular rhythm, normal heart sounds and intact distal pulses.  Exam reveals no gallop and no friction rub.   No murmur heard. Pulmonary/Chest: Effort normal and breath sounds normal. No respiratory distress.  He has no wheezes. He has no rales. He exhibits no tenderness.  Abdominal: Soft. Bowel sounds are normal. He exhibits no distension and no mass. There is no tenderness. There is no rebound and no guarding.  Musculoskeletal: Normal range of motion. He exhibits tenderness. He exhibits no edema.  Lymphadenopathy:    He has no cervical adenopathy.  Neurological: He is alert and oriented to person, place, and time. He has normal reflexes. No cranial nerve deficit. He exhibits normal muscle  tone. He displays a negative Romberg sign. Coordination and gait normal.  Skin: Skin is warm and dry. No rash noted.  Psychiatric: He has a normal mood and affect. His behavior is normal. Judgment and thought content normal.  R MTP tender  Lab Results  Component Value Date   WBC 12.7 (H) 05/30/2016   HGB 15.4 05/30/2016   HCT 45.0 05/30/2016   PLT 259.0 05/30/2016   GLUCOSE 118 (H) 05/30/2016   CHOL 251 (H) 05/30/2016   TRIG 257.0 (H) 05/30/2016   HDL 59.50 05/30/2016   LDLDIRECT 157.0 05/30/2016   ALT 24 05/30/2016   AST 17 05/30/2016   NA 140 05/30/2016   K 4.3 05/30/2016   CL 104 05/30/2016   CREATININE 0.98 05/30/2016   BUN 12 05/30/2016   CO2 30 05/30/2016   TSH 1.61 05/30/2016   PSA 3.00 05/30/2016   HGBA1C 7.4 (H) 01/28/2007    No results found.  Assessment & Plan:   There are no diagnoses linked to this encounter. I am having Mr. Jurewicz maintain his Cholecalciferol, colchicine, ketoprofen, sildenafil, amLODipine, HYDROcodone-acetaminophen, triamcinolone cream, allopurinol, tamsulosin, and predniSONE.  No orders of the defined types were placed in this encounter.    Follow-up: No Follow-up on file.  Sonda Primes, MD

## 2017-04-08 NOTE — Assessment & Plan Note (Signed)
Renewed meds

## 2017-04-08 NOTE — Assessment & Plan Note (Signed)
Worse Allopurinol increased Colchicine, Predn prn

## 2017-04-08 NOTE — Assessment & Plan Note (Signed)
Vit D 

## 2017-04-30 ENCOUNTER — Other Ambulatory Visit: Payer: Self-pay | Admitting: Internal Medicine

## 2017-06-19 ENCOUNTER — Other Ambulatory Visit: Payer: Self-pay | Admitting: Internal Medicine

## 2017-07-07 ENCOUNTER — Other Ambulatory Visit (INDEPENDENT_AMBULATORY_CARE_PROVIDER_SITE_OTHER): Payer: Medicare Other

## 2017-07-07 DIAGNOSIS — E559 Vitamin D deficiency, unspecified: Secondary | ICD-10-CM

## 2017-07-07 DIAGNOSIS — M1000 Idiopathic gout, unspecified site: Secondary | ICD-10-CM

## 2017-07-07 DIAGNOSIS — I10 Essential (primary) hypertension: Secondary | ICD-10-CM

## 2017-07-07 LAB — HEPATIC FUNCTION PANEL
ALBUMIN: 4.1 g/dL (ref 3.5–5.2)
ALT: 17 U/L (ref 0–53)
AST: 14 U/L (ref 0–37)
Alkaline Phosphatase: 60 U/L (ref 39–117)
Bilirubin, Direct: 0.1 mg/dL (ref 0.0–0.3)
Total Bilirubin: 0.3 mg/dL (ref 0.2–1.2)
Total Protein: 7.1 g/dL (ref 6.0–8.3)

## 2017-07-07 LAB — BASIC METABOLIC PANEL
BUN: 13 mg/dL (ref 6–23)
CALCIUM: 9.7 mg/dL (ref 8.4–10.5)
CO2: 26 mEq/L (ref 19–32)
Chloride: 102 mEq/L (ref 96–112)
Creatinine, Ser: 0.96 mg/dL (ref 0.40–1.50)
GFR: 80.95 mL/min (ref >60.00)
Glucose, Bld: 192 mg/dL — ABNORMAL HIGH (ref 70–99)
POTASSIUM: 4.1 meq/L (ref 3.5–5.1)
SODIUM: 137 meq/L (ref 135–145)

## 2017-07-07 LAB — SEDIMENTATION RATE: SED RATE: 28 mm/h — AB (ref 0–20)

## 2017-07-07 LAB — URIC ACID: Uric Acid, Serum: 3.6 mg/dL — ABNORMAL LOW (ref 4.0–7.8)

## 2017-07-09 ENCOUNTER — Encounter: Payer: Self-pay | Admitting: Internal Medicine

## 2017-07-09 ENCOUNTER — Other Ambulatory Visit (INDEPENDENT_AMBULATORY_CARE_PROVIDER_SITE_OTHER): Payer: Medicare Other

## 2017-07-09 ENCOUNTER — Ambulatory Visit (INDEPENDENT_AMBULATORY_CARE_PROVIDER_SITE_OTHER): Payer: Medicare Other | Admitting: Internal Medicine

## 2017-07-09 VITALS — BP 138/82 | HR 84 | Temp 98.3°F | Ht 72.0 in | Wt 153.0 lb

## 2017-07-09 DIAGNOSIS — R7309 Other abnormal glucose: Secondary | ICD-10-CM

## 2017-07-09 DIAGNOSIS — Z23 Encounter for immunization: Secondary | ICD-10-CM | POA: Diagnosis not present

## 2017-07-09 DIAGNOSIS — I10 Essential (primary) hypertension: Secondary | ICD-10-CM

## 2017-07-09 DIAGNOSIS — N320 Bladder-neck obstruction: Secondary | ICD-10-CM

## 2017-07-09 DIAGNOSIS — M1000 Idiopathic gout, unspecified site: Secondary | ICD-10-CM | POA: Diagnosis not present

## 2017-07-09 LAB — HEMOGLOBIN A1C: Hgb A1c MFr Bld: 7 % — ABNORMAL HIGH (ref 4.6–6.5)

## 2017-07-09 MED ORDER — TADALAFIL 5 MG PO TABS: 5.0000 mg | ORAL_TABLET | Freq: Every day | ORAL | 11 refills | Status: DC

## 2017-07-09 MED ORDER — PREDNISONE 10 MG PO TABS: ORAL_TABLET | ORAL | 0 refills | Status: DC

## 2017-07-09 NOTE — Progress Notes (Signed)
Subjective:  Patient ID: Brent Turner, male    DOB: 12-25-1940  Age: 76 y.o. MRN: [id number]  CC: No chief complaint on file.   HPI Venancio Poissonoy R Bernabei presents for gout, HTN, BPH f/u  Outpatient Medications Prior to Visit  Medication Sig Dispense Refill  . allopurinol (ZYLOPRIM) 300 MG tablet Take 1 tablet (300 mg total) by mouth daily. 30 tablet 11  . amLODipine (NORVASC) 5 MG tablet Take 1 tablet (5 mg total) by mouth daily. 90 tablet 3  . Cholecalciferol 1000 UNITS tablet Take 1,000 Units by mouth daily.      . colchicine 0.6 MG tablet Take 1 tablet (0.6 mg total) by mouth 4 (four) times daily as needed (gout attack). For gout 60 tablet 3  . HYDROcodone-acetaminophen (NORCO/VICODIN) 5-325 MG tablet Take 1-2 tablets by mouth 2 (two) times daily as needed for moderate pain or severe pain. 100 tablet 0  . predniSONE (DELTASONE) 10 MG tablet TAKE 4 TABLETS BY MOUTH DAILY FOR 2-3 DAYS AS NEEDED FOR GOUT ATTACK. TAKE AFTER MEALS 60 tablet 0  . sildenafil (VIAGRA) 100 MG tablet Take 1 tablet (100 mg total) by mouth daily as needed. 30 tablet 4  . tamsulosin (FLOMAX) 0.4 MG CAPS capsule Take 1 capsule (0.4 mg total) by mouth daily. 90 capsule 2  . triamcinolone cream (KENALOG) 0.5 % Apply 1 application topically 2 (two) times daily. 30 g 3   No facility-administered medications prior to visit.     ROS Review of Systems  Constitutional: Negative for appetite change, fatigue and unexpected weight change.  HENT: Negative for congestion, nosebleeds, sneezing, sore throat and trouble swallowing.   Eyes: Negative for itching and visual disturbance.  Respiratory: Negative for cough.   Cardiovascular: Negative for chest pain, palpitations and leg swelling.  Gastrointestinal: Negative for abdominal distention, blood in stool, diarrhea and nausea.  Genitourinary: Negative for frequency and hematuria.  Musculoskeletal: Positive for arthralgias. Negative for back pain, joint swelling and neck pain.    Skin: Negative for rash.  Neurological: Negative for dizziness, tremors, speech difficulty and weakness.  Psychiatric/Behavioral: Negative for agitation, dysphoric mood and sleep disturbance. The patient is not nervous/anxious.     Objective:  BP 138/82 (BP Location: Left Arm, Patient Position: Sitting, Cuff Size: Normal)   Pulse 84   Temp 98.3 F (36.8 C) (Oral)   Ht 6' (1.829 m)   Wt 153 lb (69.4 kg)   SpO2 99%   BMI 20.75 kg/m   BP Readings from Last 3 Encounters:  07/09/17 138/82  04/08/17 128/74  05/29/16 130/70    Wt Readings from Last 3 Encounters:  07/09/17 153 lb (69.4 kg)  04/08/17 152 lb (68.9 kg)  05/29/16 153 lb (69.4 kg)    Physical Exam  Constitutional: He is oriented to person, place, and time. He appears well-developed. No distress.  NAD  HENT:  Mouth/Throat: Oropharynx is clear and moist.  Eyes: Conjunctivae are normal. Pupils are equal, round, and reactive to light.  Neck: Normal range of motion. No JVD present. No thyromegaly present.  Cardiovascular: Normal rate, regular rhythm, normal heart sounds and intact distal pulses. Exam reveals no gallop and no friction rub.  No murmur heard. Pulmonary/Chest: Effort normal and breath sounds normal. No respiratory distress. He has no wheezes. He has no rales. He exhibits no tenderness.  Abdominal: Soft. Bowel sounds are normal. He exhibits no distension and no mass. There is no tenderness. There is no rebound and no guarding.  Musculoskeletal: Normal  range of motion. He exhibits tenderness. He exhibits no edema.  Lymphadenopathy:    He has no cervical adenopathy.  Neurological: He is alert and oriented to person, place, and time. He has normal reflexes. No cranial nerve deficit. He exhibits normal muscle tone. He displays a negative Romberg sign. Coordination and gait normal.  Skin: Skin is warm and dry. No rash noted.  Psychiatric: He has a normal mood and affect. His behavior is normal. Judgment and thought  content normal.    Lab Results  Component Value Date   WBC 12.7 (H) 05/30/2016   HGB 15.4 05/30/2016   HCT 45.0 05/30/2016   PLT 259.0 05/30/2016   GLUCOSE 192 (H) 07/07/2017   CHOL 251 (H) 05/30/2016   TRIG 257.0 (H) 05/30/2016   HDL 59.50 05/30/2016   LDLDIRECT 157.0 05/30/2016   ALT 17 07/07/2017   AST 14 07/07/2017   NA 137 07/07/2017   K 4.1 07/07/2017   CL 102 07/07/2017   CREATININE 0.96 07/07/2017   BUN 13 07/07/2017   CO2 26 07/07/2017   TSH 1.61 05/30/2016   PSA 3.00 05/30/2016   HGBA1C 7.0 (H) 07/09/2017    No results found.  Assessment & Plan:   Diagnoses and all orders for this visit:  Need for influenza vaccination -     Flu vaccine HIGH DOSE PF (Fluzone High dose)   I am having Venancio Poissonoy R. Rolfson maintain his Cholecalciferol, sildenafil, triamcinolone cream, allopurinol, HYDROcodone-acetaminophen, colchicine, tamsulosin, amLODipine, and predniSONE.  No orders of the defined types were placed in this encounter.    Follow-up: No Follow-up on file.  Sonda PrimesAlex [name], MD

## 2017-07-09 NOTE — Assessment & Plan Note (Signed)
Amlodipine.

## 2017-07-09 NOTE — Assessment & Plan Note (Signed)
Prednisone prn 

## 2017-07-09 NOTE — Assessment & Plan Note (Signed)
Cialis 5 mg

## 2017-07-09 NOTE — Patient Instructions (Signed)
[facility] w/Jill 

## 2017-09-17 DIAGNOSIS — H52223 Regular astigmatism, bilateral: Secondary | ICD-10-CM | POA: Diagnosis not present

## 2017-09-17 DIAGNOSIS — H43811 Vitreous degeneration, right eye: Secondary | ICD-10-CM | POA: Diagnosis not present

## 2017-09-17 DIAGNOSIS — H11153 Pinguecula, bilateral: Secondary | ICD-10-CM | POA: Diagnosis not present

## 2017-09-17 DIAGNOSIS — H40043 Steroid responder, bilateral: Secondary | ICD-10-CM | POA: Diagnosis not present

## 2017-09-17 DIAGNOSIS — H5703 Miosis: Secondary | ICD-10-CM | POA: Diagnosis not present

## 2017-09-17 DIAGNOSIS — Z961 Presence of intraocular lens: Secondary | ICD-10-CM | POA: Diagnosis not present

## 2018-01-06 ENCOUNTER — Ambulatory Visit: Payer: Medicare Other | Admitting: Internal Medicine

## 2018-01-06 DIAGNOSIS — Z0289 Encounter for other administrative examinations: Secondary | ICD-10-CM

## 2018-03-22 ENCOUNTER — Other Ambulatory Visit: Payer: Self-pay | Admitting: Internal Medicine

## 2018-03-23 ENCOUNTER — Other Ambulatory Visit: Payer: Self-pay | Admitting: Internal Medicine

## 2018-04-27 ENCOUNTER — Encounter: Payer: Self-pay | Admitting: Internal Medicine

## 2018-04-27 ENCOUNTER — Ambulatory Visit (INDEPENDENT_AMBULATORY_CARE_PROVIDER_SITE_OTHER): Payer: Medicare Other | Admitting: Internal Medicine

## 2018-04-27 ENCOUNTER — Other Ambulatory Visit (INDEPENDENT_AMBULATORY_CARE_PROVIDER_SITE_OTHER): Payer: Medicare Other

## 2018-04-27 ENCOUNTER — Ambulatory Visit (INDEPENDENT_AMBULATORY_CARE_PROVIDER_SITE_OTHER)
Admission: RE | Admit: 2018-04-27 | Discharge: 2018-04-27 | Disposition: A | Discharge status: Home / Self Care | Payer: Medicare Other | Source: Ambulatory Visit | Attending: Internal Medicine | Admitting: Internal Medicine

## 2018-04-27 VITALS — BP 132/70 | HR 80 | Temp 97.9°F | Ht 72.0 in | Wt 146.0 lb

## 2018-04-27 DIAGNOSIS — R634 Abnormal weight loss: Secondary | ICD-10-CM | POA: Insufficient documentation

## 2018-04-27 DIAGNOSIS — I10 Essential (primary) hypertension: Secondary | ICD-10-CM

## 2018-04-27 DIAGNOSIS — N320 Bladder-neck obstruction: Secondary | ICD-10-CM

## 2018-04-27 DIAGNOSIS — J449 Chronic obstructive pulmonary disease, unspecified: Secondary | ICD-10-CM

## 2018-04-27 DIAGNOSIS — E785 Hyperlipidemia, unspecified: Secondary | ICD-10-CM

## 2018-04-27 DIAGNOSIS — E559 Vitamin D deficiency, unspecified: Secondary | ICD-10-CM | POA: Diagnosis not present

## 2018-04-27 LAB — BASIC METABOLIC PANEL
BUN: 14 mg/dL (ref 6–23)
CALCIUM: 9.3 mg/dL (ref 8.4–10.5)
CHLORIDE: 105 meq/L (ref 96–112)
CO2: 26 meq/L (ref 19–32)
CREATININE: 0.97 mg/dL (ref 0.40–1.50)
GFR: 79.82 mL/min (ref >60.00)
Glucose, Bld: 129 mg/dL — ABNORMAL HIGH (ref 70–99)
Potassium: 3.8 mEq/L (ref 3.5–5.1)
SODIUM: 138 meq/L (ref 135–145)

## 2018-04-27 LAB — CBC WITH DIFFERENTIAL/PLATELET
BASOS PCT: 0.5 % (ref 0.0–3.0)
Basophils Absolute: 0 10*3/uL (ref 0.0–0.1)
EOS ABS: 0.3 10*3/uL (ref 0.0–0.7)
EOS PCT: 3.2 % (ref 0.0–5.0)
HEMATOCRIT: 41.1 % (ref 39.0–52.0)
Hemoglobin: 14.1 g/dL (ref 13.0–17.0)
LYMPHS PCT: 41.3 % (ref 12.0–46.0)
Lymphs Abs: 3.8 10*3/uL (ref 0.7–4.0)
MCHC: 34.3 g/dL (ref 30.0–36.0)
MCV: 100 fl (ref 78.0–100.0)
Monocytes Absolute: 0.6 10*3/uL (ref 0.1–1.0)
Monocytes Relative: 5.9 % (ref 3.0–12.0)
NEUTROS ABS: 4.6 10*3/uL (ref 1.4–7.7)
Neutrophils Relative %: 49.1 % (ref 43.0–77.0)
PLATELETS: 271 10*3/uL (ref 150.0–400.0)
RBC: 4.11 Mil/uL — ABNORMAL LOW (ref 4.22–5.81)
RDW: 14.2 % (ref 11.5–15.5)
WBC: 9.3 10*3/uL (ref 4.0–10.5)

## 2018-04-27 LAB — LIPID PANEL
CHOLESTEROL: 190 mg/dL (ref 0–200)
HDL: 47.8 mg/dL (ref >39.00)
LDL CALC: 102 mg/dL — AB (ref 0–99)
NonHDL: 141.87
TRIGLYCERIDES: 199 mg/dL — AB (ref 0.0–149.0)
Total CHOL/HDL Ratio: 4
VLDL: 39.8 mg/dL (ref 0.0–40.0)

## 2018-04-27 LAB — PSA: PSA: 3.82 ng/mL (ref 0.10–4.00)

## 2018-04-27 LAB — HEPATIC FUNCTION PANEL
ALK PHOS: 64 U/L (ref 39–117)
ALT: 21 U/L (ref 0–53)
AST: 12 U/L (ref 0–37)
Albumin: 3.9 g/dL (ref 3.5–5.2)
Bilirubin, Direct: 0.1 mg/dL (ref 0.0–0.3)
Total Bilirubin: 0.5 mg/dL (ref 0.2–1.2)
Total Protein: 6.4 g/dL (ref 6.0–8.3)

## 2018-04-27 LAB — TSH: TSH: 1.59 u[IU]/mL (ref 0.35–4.50)

## 2018-04-27 IMAGING — DX DG CHEST 2V
2 series · 2 of 2 positions shown · non-contrast
Comparison: [DATE]

CLINICAL DATA: Weight loss. Chronic obstructive pulmonary disease,
unspecified.

EXAM:
CHEST - 2 VIEW

[chest pa]
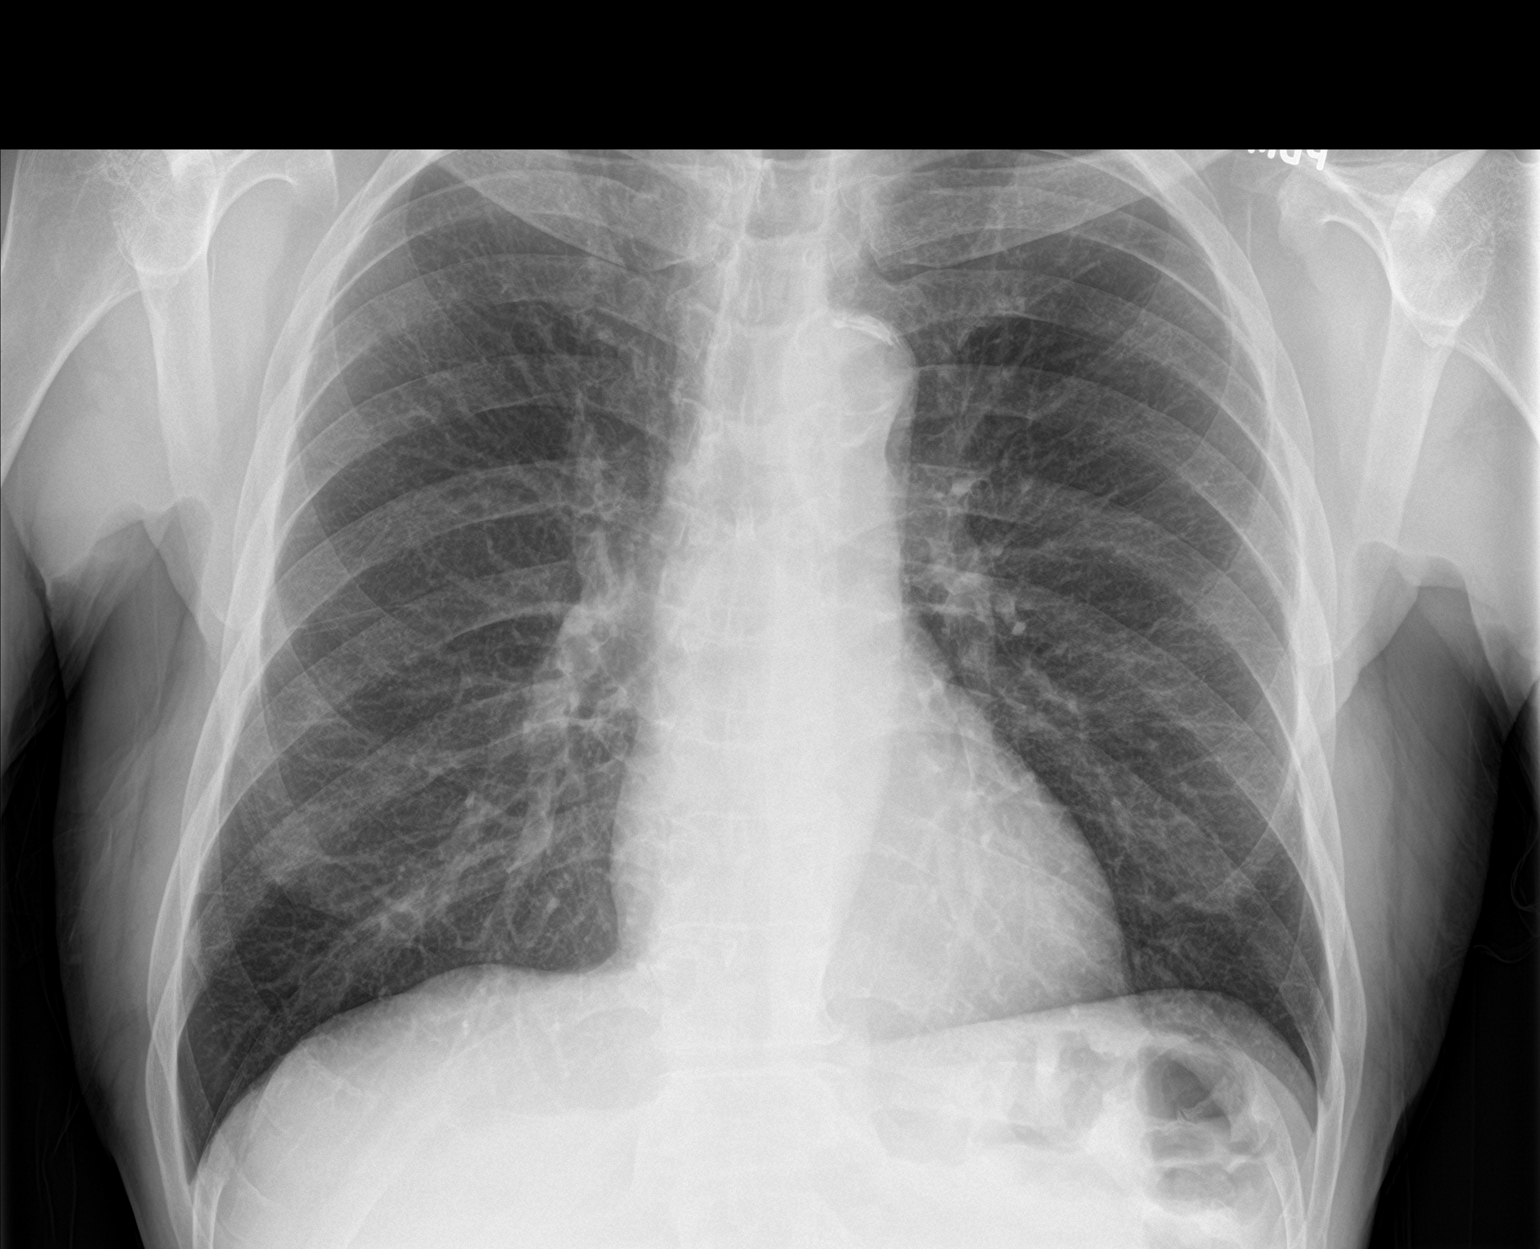

[chest lat]
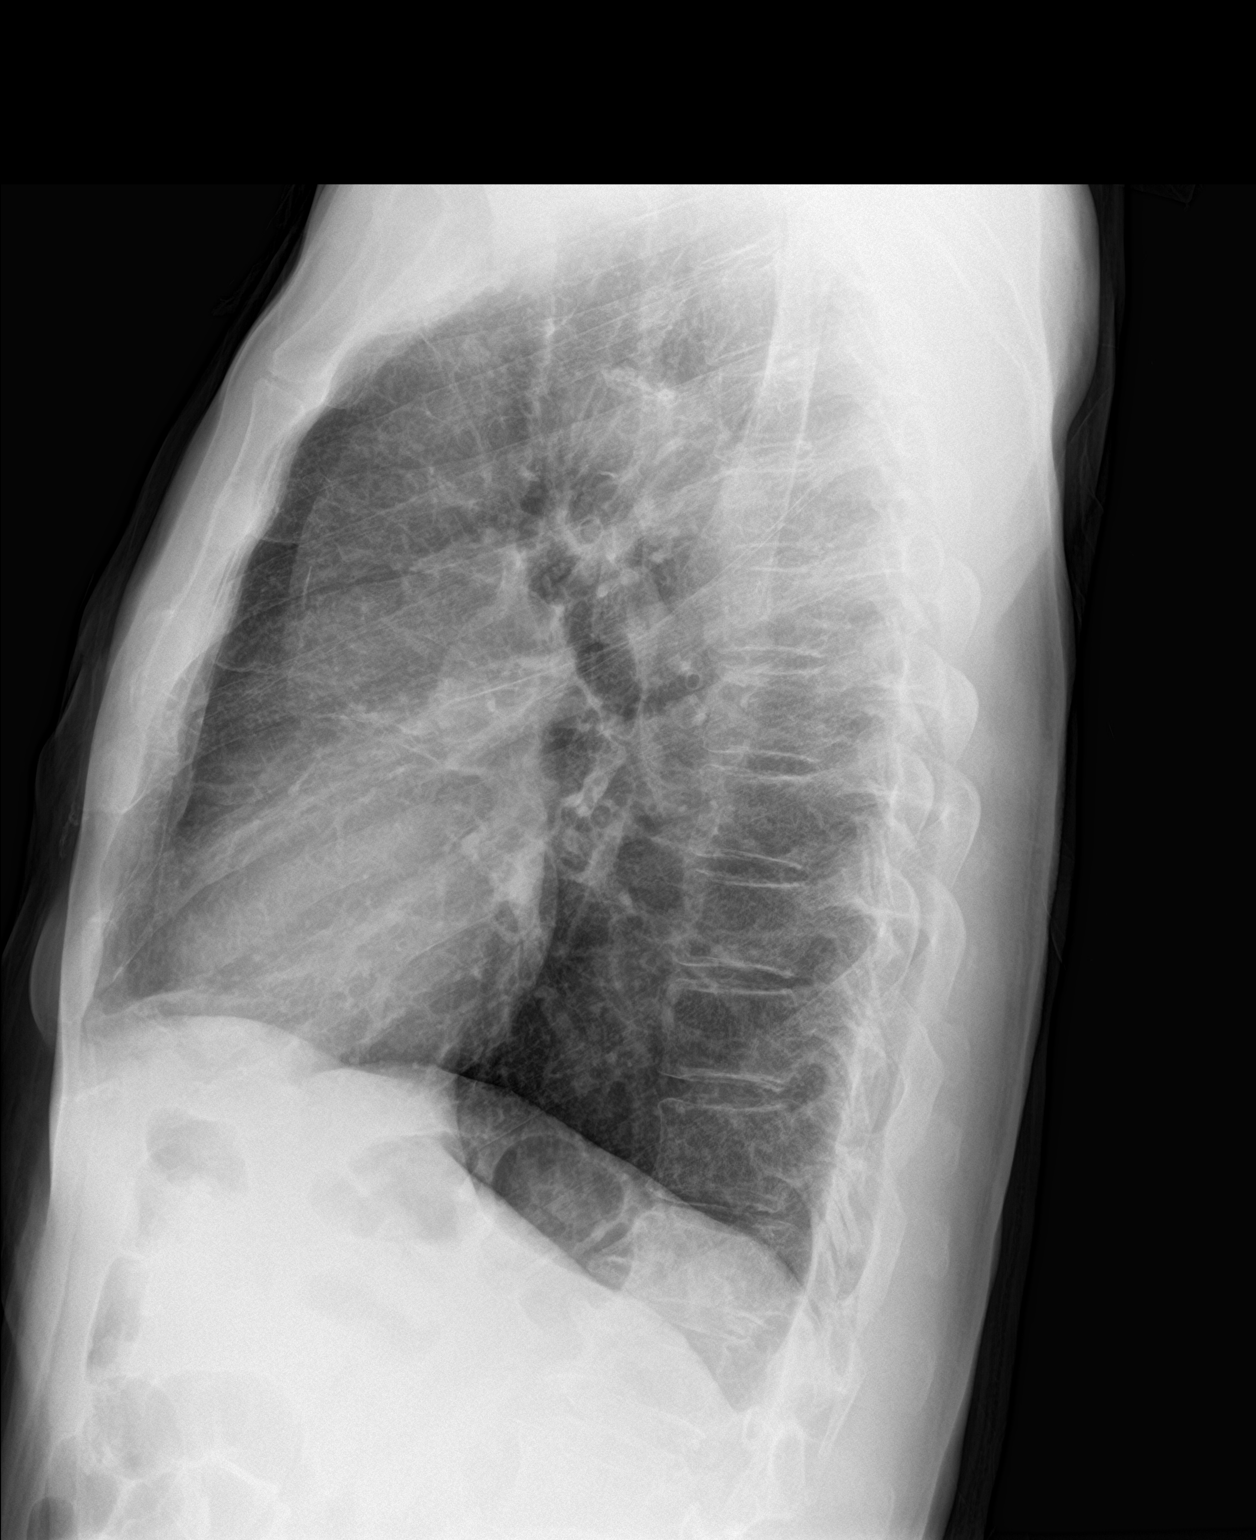

[2 of 2 positions shown; findings below may reference images not displayed]

FINDINGS: Lungs are clear without airspace disease or pulmonary edema. Heart
and mediastinum are within normal limits. Atherosclerotic
calcifications at the aortic arch. Trachea is midline. No pleural
effusions. Bone structures are unremarkable.
IMPRESSION: No active cardiopulmonary disease.

Aortic atherosclerosis.

## 2018-04-27 MED ORDER — TADALAFIL 5 MG PO TABS: 5.0000 mg | ORAL_TABLET | Freq: Every day | ORAL | 11 refills | Status: DC

## 2018-04-27 MED ORDER — TRIAMCINOLONE ACETONIDE 0.5 % EX CREA: 1.0000 "application " | TOPICAL_CREAM | Freq: Two times a day (BID) | CUTANEOUS | 3 refills | Status: DC

## 2018-04-27 MED ORDER — AMLODIPINE BESYLATE 5 MG PO TABS: 5.0000 mg | ORAL_TABLET | Freq: Every day | ORAL | 3 refills | Status: DC

## 2018-04-27 MED ORDER — COLCHICINE 0.6 MG PO TABS: 0.6000 mg | ORAL_TABLET | Freq: Four times a day (QID) | ORAL | 3 refills | Status: DC | PRN

## 2018-04-27 MED ORDER — ALLOPURINOL 300 MG PO TABS: 300.0000 mg | ORAL_TABLET | Freq: Every day | ORAL | 11 refills | Status: DC

## 2018-04-27 MED ORDER — PREDNISONE 10 MG PO TABS: ORAL_TABLET | ORAL | 0 refills | Status: DC

## 2018-04-27 MED ORDER — HYDROCODONE-ACETAMINOPHEN 5-325 MG PO TABS: 1.0000 | ORAL_TABLET | Freq: Two times a day (BID) | ORAL | 0 refills | Status: DC | PRN

## 2018-04-27 NOTE — Assessment & Plan Note (Signed)
Vit D 

## 2018-04-27 NOTE — Patient Instructions (Signed)
Low-Purine Diet Purines are compounds that affect the level of uric acid in your body. A low-purine diet is a diet that is low in purines. Eating a low-purine diet can prevent the level of uric acid in your body from getting too high and causing gout or kidney stones or both. What do I need to know about this diet?  Choose low-purine foods. Examples of low-purine foods are listed in the next section.  Drink plenty of fluids, especially water. Fluids can help remove uric acid from your body. Try to drink 8-16 cups (1.9-3.8 L) a day.  Limit foods high in fat, especially saturated fat, as fat makes it harder for the body to get rid of uric acid. Foods high in saturated fat include pizza, cheese, ice cream, whole milk, fried foods, and gravies. Choose foods that are lower in fat and lean sources of protein. Use olive oil when cooking as it contains healthy fats that are not high in saturated fat.  Limit alcohol. Alcohol interferes with the elimination of uric acid from your body. If you are having a gout attack, avoid all alcohol.  Keep in mind that different people's bodies react differently to different foods. You will probably learn over time which foods do or do not affect you. If you discover that a food tends to cause your gout to flare up, avoid eating that food. You can more freely enjoy foods that do not cause problems. If you have any questions about a food item, talk to your dietitian or health care provider. Which foods are low, moderate, and high in purines? The following is a list of foods that are low, moderate, and high in purines. You can eat any amount of the foods that are low in purines. You may be able to have small amounts of foods that are moderate in purines. Ask your health care provider how much of a food moderate in purines you can have. Avoid foods high in purines. Grains  Foods low in purines: Enriched white bread, pasta, rice, cake, cornbread, popcorn.  Foods moderate in  purines: Whole-grain breads and cereals, wheat germ, bran, oatmeal. Uncooked oatmeal. Dry wheat bran or wheat germ.  Foods high in purines: Pancakes, French toast, biscuits, muffins. Vegetables  Foods low in purines: All vegetables, except those that are moderate in purines.  Foods moderate in purines: Asparagus, cauliflower, spinach, mushrooms, green peas. Fruits  All fruits are low in purines. Meats and other Protein Foods  Foods low in purines: Eggs, nuts, peanut butter.  Foods moderate in purines: 80-90% lean beef, lamb, veal, pork, poultry, fish, eggs, peanut butter, nuts. Crab, lobster, oysters, and shrimp. Cooked dried beans, peas, and lentils.  Foods high in purines: Anchovies, sardines, herring, mussels, tuna, codfish, scallops, trout, and haddock. Bacon. Organ meats (such as liver or kidney). Tripe. Game meat. Goose. Sweetbreads. Dairy  All dairy foods are low in purines. Low-fat and fat-free dairy products are best because they are low in saturated fat. Beverages  Drinks low in purines: Water, carbonated beverages, tea, coffee, cocoa.  Drinks moderate in purines: Soft drinks and other drinks sweetened with high-fructose corn syrup. Juices. To find whether a food or drink is sweetened with high-fructose corn syrup, look at the ingredients list.  Drinks high in purines: Alcoholic beverages (such as beer). Condiments  Foods low in purines: Salt, herbs, olives, pickles, relishes, vinegar.  Foods moderate in purines: Butter, margarine, oils, mayonnaise. Fats and Oils  Foods low in purines: All types, except gravies   and sauces made with meat.  Foods high in purines: Gravies and sauces made with meat. Other Foods  Foods low in purines: Sugars, sweets, gelatin. Cake. Soups made without meat.  Foods moderate in purines: Meat-based or fish-based soups, broths, or bouillons. Foods and drinks sweetened with high-fructose corn syrup.  Foods high in purines: High-fat desserts  (such as ice cream, cookies, cakes, pies, doughnuts, and chocolate). Contact your dietitian for more information on foods that are not listed here. This information is not intended to replace advice given to you by your health care provider. Make sure you discuss any questions you have with your health care provider. Document Released: 12/14/2010 Document Revised: 01/25/2016 Document Reviewed: 07/26/2013 Elsevier Interactive Patient Education  2017 [employer] Gout Gout is painful swelling that can happen in some of your joints. Gout is a type of arthritis. This condition is caused by having too much uric acid in your body. Uric acid is a chemical that is made when your body breaks down substances called purines. If your body has too much uric acid, sharp crystals can form and build up in your joints. This causes pain and swelling. Gout attacks can happen quickly and be very painful (acute gout). Over time, the attacks can affect more joints and happen more often (chronic gout). Follow these instructions at home: During a Gout Attack  If directed, put ice on the painful area: ? Put ice in a plastic bag. ? Place a towel between your skin and the bag. ? Leave the ice on for 20 minutes, 2-3 times a day.  Rest the joint as much as possible. If the joint is in your leg, you may be given crutches to use.  Raise (elevate) the painful joint above the level of your heart as often as you can.  Drink enough fluids to keep your pee (urine) clear or pale yellow.  Take over-the-counter and prescription medicines only as told by your doctor.  Do not drive or use heavy machinery while taking prescription pain medicine.  Follow instructions from your doctor about what you can or cannot eat and drink.  Return to your normal activities as told by your doctor. Ask your doctor what activities are safe for you. Avoiding Future Gout Attacks  Follow a low-purine diet as told by a specialist (dietitian) or  your doctor. Avoid foods and drinks that have a lot of purines, such as: ? Liver. ? Kidney. ? Anchovies. ? Asparagus. ? Herring. ? Mushrooms ? Mussels. ? Beer.  Limit alcohol intake to no more than 1 drink a day for nonpregnant women and 2 drinks a day for men. One drink equals 12 oz of beer, 5 oz of wine, or 1 oz of hard liquor.  Stay at a healthy weight or lose weight if you are overweight. If you want to lose weight, talk with your doctor. It is important that you do not lose weight too fast.  Start or continue an exercise plan as told by your doctor.  Drink enough fluids to keep your pee clear or pale yellow.  Take over-the-counter and prescription medicines only as told by your doctor.  Keep all follow-up visits as told by your doctor. This is important. Contact a doctor if:  You have another gout attack.  You still have symptoms of a gout attack after10 days of treatment.  You have problems (side effects) because of your medicines.  You have chills or a fever.  You have burning pain when you pee (  urinate).  You have pain in your lower back or belly. Get help right away if:  You have very bad pain.  Your pain cannot be controlled.  You cannot pee. This information is not intended to replace advice given to you by your health care provider. Make sure you discuss any questions you have with your health care provider. Document Released: 05/28/2008 Document Revised: 01/25/2016 Document Reviewed: 06/01/2015 Elsevier Interactive Patient Education  2018 [employer]  

## 2018-04-27 NOTE — Assessment & Plan Note (Signed)
Labs

## 2018-04-27 NOTE — Assessment & Plan Note (Signed)
Labs CXR Try to smoke less

## 2018-04-27 NOTE — Progress Notes (Signed)
Subjective:  Patient ID: Brent Turner, male    DOB: 01-16-1941  Age: 77 y.o. MRN: [id number]  CC: No chief complaint on file.   HPI Brent Turner presents for OA, HTN, BPH   Outpatient Medications Prior to Visit  Medication Sig Dispense Refill  . Cholecalciferol 1000 UNITS tablet Take 1,000 Units by mouth daily.      . tamsulosin (FLOMAX) 0.4 MG CAPS capsule TAKE 1 CAPSULE BY MOUTH EVERY DAY 90 capsule 3  . allopurinol (ZYLOPRIM) 300 MG tablet Take 1 tablet (300 mg total) by mouth daily. Annual appt due in Augustt must see provider for future refills 30 tablet 0  . amLODipine (NORVASC) 5 MG tablet Take 1 tablet (5 mg total) by mouth daily. 90 tablet 3  . colchicine 0.6 MG tablet Take 1 tablet (0.6 mg total) by mouth 4 (four) times daily as needed (gout attack). For gout 60 tablet 3  . HYDROcodone-acetaminophen (NORCO/VICODIN) 5-325 MG tablet Take 1-2 tablets by mouth 2 (two) times daily as needed for moderate pain or severe pain. 100 tablet 0  . predniSONE (DELTASONE) 10 MG tablet TAKE 4 TABLETS BY MOUTH DAILY FOR 2-3 DAYS AS NEEDED FOR GOUT ATTACK. TAKE AFTER MEALS 60 tablet 0  . tadalafil (CIALIS) 5 MG tablet Take 1 tablet (5 mg total) daily by mouth. 30 tablet 11  . triamcinolone cream (KENALOG) 0.5 % Apply 1 application topically 2 (two) times daily. 30 g 3   No facility-administered medications prior to visit.     ROS: Review of Systems  Constitutional: Positive for fatigue and unexpected weight change. Negative for appetite change.  HENT: Negative for congestion, nosebleeds, sneezing, sore throat and trouble swallowing.   Eyes: Negative for itching and visual disturbance.  Respiratory: Negative for cough.   Cardiovascular: Negative for chest pain, palpitations and leg swelling.  Gastrointestinal: Negative for abdominal distention, blood in stool, diarrhea and nausea.  Genitourinary: Negative for frequency and hematuria.  Musculoskeletal: Positive for arthralgias and back  pain. Negative for gait problem, joint swelling and neck pain.  Skin: Negative for rash.  Neurological: Negative for dizziness, tremors, speech difficulty and weakness.  Psychiatric/Behavioral: Negative for agitation, dysphoric mood, sleep disturbance and suicidal ideas. The patient is not nervous/anxious.     Objective:  BP 132/70 (BP Location: Left Arm, Patient Position: Sitting, Cuff Size: Normal)   Pulse 80   Temp 97.9 F (36.6 C) (Oral)   Ht 6' (1.829 m)   Wt 146 lb (66.2 kg)   SpO2 96%   BMI 19.80 kg/m   BP Readings from Last 3 Encounters:  04/27/18 132/70  07/09/17 138/82  04/08/17 128/74    Wt Readings from Last 3 Encounters:  04/27/18 146 lb (66.2 kg)  07/09/17 153 lb (69.4 kg)  04/08/17 152 lb (68.9 kg)    Physical Exam  Constitutional: He is oriented to person, place, and time. He appears well-developed. No distress.  NAD  HENT:  Mouth/Throat: Oropharynx is clear and moist.  Eyes: Pupils are equal, round, and reactive to light. Conjunctivae are normal.  Neck: Normal range of motion. No JVD present. No thyromegaly present.  Cardiovascular: Normal rate, regular rhythm, normal heart sounds and intact distal pulses. Exam reveals no gallop and no friction rub.  No murmur heard. Pulmonary/Chest: Effort normal and breath sounds normal. No respiratory distress. He has no wheezes. He has no rales. He exhibits no tenderness.  Abdominal: Soft. Bowel sounds are normal. He exhibits no distension and no mass. There  is no tenderness. There is no rebound and no guarding.  Musculoskeletal: Normal range of motion. He exhibits no edema or tenderness.  Lymphadenopathy:    He has no cervical adenopathy.  Neurological: He is alert and oriented to person, place, and time. He has normal reflexes. No cranial nerve deficit. He exhibits normal muscle tone. He displays a negative Romberg sign. Coordination and gait normal.  Skin: Skin is warm and dry. No rash noted.  Psychiatric: He has a  normal mood and affect. His behavior is normal. Judgment and thought content normal.   Thin  Lab Results  Component Value Date   WBC 12.7 (H) 05/30/2016   HGB 15.4 05/30/2016   HCT 45.0 05/30/2016   PLT 259.0 05/30/2016   GLUCOSE 192 (H) 07/07/2017   CHOL 251 (H) 05/30/2016   TRIG 257.0 (H) 05/30/2016   HDL 59.50 05/30/2016   LDLDIRECT 157.0 05/30/2016   ALT 17 07/07/2017   AST 14 07/07/2017   NA 137 07/07/2017   K 4.1 07/07/2017   CL 102 07/07/2017   CREATININE 0.96 07/07/2017   BUN 13 07/07/2017   CO2 26 07/07/2017   TSH 1.61 05/30/2016   PSA 3.00 05/30/2016   HGBA1C 7.0 (H) 07/09/2017    No results found.  Assessment & Plan:   There are no diagnoses linked to this encounter.   Meds ordered this encounter  Medications  . allopurinol (ZYLOPRIM) 300 MG tablet    Sig: Take 1 tablet (300 mg total) by mouth daily.    Dispense:  30 tablet    Refill:  11  . amLODipine (NORVASC) 5 MG tablet    Sig: Take 1 tablet (5 mg total) by mouth daily.    Dispense:  90 tablet    Refill:  3  . colchicine 0.6 MG tablet    Sig: Take 1 tablet (0.6 mg total) by mouth 4 (four) times daily as needed (gout attack). For gout    Dispense:  60 tablet    Refill:  3  . HYDROcodone-acetaminophen (NORCO/VICODIN) 5-325 MG tablet    Sig: Take 1-2 tablets by mouth 2 (two) times daily as needed for moderate pain or severe pain.    Dispense:  100 tablet    Refill:  0  . predniSONE (DELTASONE) 10 MG tablet    Sig: TAKE 4 TABLETS BY MOUTH DAILY FOR 2-3 DAYS AS NEEDED FOR GOUT ATTACK. TAKE AFTER MEALS    Dispense:  60 tablet    Refill:  0    PT WOULD LIKE ADDITIONAL REFILLS  . tadalafil (CIALIS) 5 MG tablet    Sig: Take 1 tablet (5 mg total) by mouth daily.    Dispense:  30 tablet    Refill:  11  . triamcinolone cream (KENALOG) 0.5 %    Sig: Apply 1 application topically 2 (two) times daily.    Dispense:  30 g    Refill:  3     Follow-up: No follow-ups on file.  Sonda PrimesAlex Bearett Porcaro, MD

## 2018-08-06 ENCOUNTER — Other Ambulatory Visit: Payer: Self-pay | Admitting: Internal Medicine

## 2018-10-29 ENCOUNTER — Encounter: Payer: Self-pay | Admitting: Internal Medicine

## 2018-10-29 ENCOUNTER — Ambulatory Visit (INDEPENDENT_AMBULATORY_CARE_PROVIDER_SITE_OTHER): Payer: Medicare Other | Admitting: Internal Medicine

## 2018-10-29 DIAGNOSIS — I10 Essential (primary) hypertension: Secondary | ICD-10-CM

## 2018-10-29 DIAGNOSIS — M1000 Idiopathic gout, unspecified site: Secondary | ICD-10-CM

## 2018-10-29 DIAGNOSIS — E559 Vitamin D deficiency, unspecified: Secondary | ICD-10-CM

## 2018-10-29 DIAGNOSIS — R634 Abnormal weight loss: Secondary | ICD-10-CM | POA: Diagnosis not present

## 2018-10-29 DIAGNOSIS — E785 Hyperlipidemia, unspecified: Secondary | ICD-10-CM | POA: Insufficient documentation

## 2018-10-29 MED ORDER — TAMSULOSIN HCL 0.4 MG PO CAPS: 0.4000 mg | ORAL_CAPSULE | Freq: Every day | ORAL | 3 refills | Status: DC

## 2018-10-29 MED ORDER — PREDNISONE 10 MG PO TABS: ORAL_TABLET | ORAL | 0 refills | Status: DC

## 2018-10-29 MED ORDER — TRIAMCINOLONE ACETONIDE 0.5 % EX CREA: 1.0000 "application " | TOPICAL_CREAM | Freq: Two times a day (BID) | CUTANEOUS | 3 refills | Status: DC

## 2018-10-29 MED ORDER — HYDROCODONE-ACETAMINOPHEN 5-325 MG PO TABS: 1.0000 | ORAL_TABLET | Freq: Two times a day (BID) | ORAL | 0 refills | Status: DC | PRN

## 2018-10-29 NOTE — Assessment & Plan Note (Addendum)
Wt Readings from Last 3 Encounters:  10/29/18 148 lb (67.1 kg)  04/27/18 146 lb (66.2 kg)  07/09/17 153 lb (69.4 kg)   No relapse

## 2018-10-29 NOTE — Assessment & Plan Note (Signed)
Recurrent. Better on Allopurinol Prednisone prn Norco prn  Potential benefits of a long term opioids use as well as potential risks (i.e. addiction risk, apnea etc) and complications (i.e. Somnolence, constipation and others) were explained to the patient and were aknowledged.

## 2018-10-29 NOTE — Assessment & Plan Note (Signed)
A cardiac CT scan for calcium scoring offered 

## 2018-10-29 NOTE — Assessment & Plan Note (Signed)
Vit d 

## 2018-10-29 NOTE — Patient Instructions (Signed)

## 2018-10-29 NOTE — Progress Notes (Signed)
Subjective:  Patient ID: Brent Turner, male    DOB: 10/29/1940  Age: 78 y.o. MRN: [id number]  CC: No chief complaint on file.   HPI Brent Turner presents for dyslipidemia, HTN, OA/gout f/u  Outpatient Medications Prior to Visit  Medication Sig Dispense Refill  . allopurinol (ZYLOPRIM) 300 MG tablet Take 1 tablet (300 mg total) by mouth daily. 30 tablet 11  . amLODipine (NORVASC) 5 MG tablet Take 1 tablet (5 mg total) by mouth daily. 90 tablet 3  . Cholecalciferol 1000 UNITS tablet Take 1,000 Units by mouth daily.      . colchicine 0.6 MG tablet Take 1 tablet (0.6 mg total) by mouth 4 (four) times daily as needed (gout attack). For gout 60 tablet 3  . predniSONE (DELTASONE) 10 MG tablet TAKE 4 TABLETS BY MOUTH DAILY FOR 2-3 DAYS AS NEEDED FOR GOUT ATTACK. TAKE AFTER MEALS 60 tablet 0  . tadalafil (CIALIS) 5 MG tablet Take 1 tablet (5 mg total) by mouth daily. 30 tablet 11  . tamsulosin (FLOMAX) 0.4 MG CAPS capsule TAKE 1 CAPSULE BY MOUTH EVERY DAY 90 capsule 3  . triamcinolone cream (KENALOG) 0.5 % Apply 1 application topically 2 (two) times daily. 30 g 3  . HYDROcodone-acetaminophen (NORCO/VICODIN) 5-325 MG tablet Take 1-2 tablets by mouth 2 (two) times daily as needed for moderate pain or severe pain. 100 tablet 0   No facility-administered medications prior to visit.     ROS: Review of Systems  Constitutional: Negative for appetite change, fatigue and unexpected weight change.  HENT: Negative for congestion, nosebleeds, sneezing, sore throat and trouble swallowing.   Eyes: Negative for itching and visual disturbance.  Respiratory: Negative for cough.   Cardiovascular: Negative for chest pain, palpitations and leg swelling.  Gastrointestinal: Negative for abdominal distention, blood in stool, diarrhea and nausea.  Genitourinary: Negative for frequency and hematuria.  Musculoskeletal: Positive for arthralgias. Negative for back pain, gait problem, joint swelling and neck pain.    Skin: Negative for rash.  Neurological: Negative for dizziness, tremors, speech difficulty and weakness.  Psychiatric/Behavioral: Negative for agitation, dysphoric mood, sleep disturbance and suicidal ideas. The patient is not nervous/anxious.     Objective:  BP 126/74 (BP Location: Left Arm, Patient Position: Sitting, Cuff Size: Normal)   Pulse 71   Temp 97.6 F (36.4 C) (Oral)   Ht 6' (1.829 m)   Wt 148 lb (67.1 kg)   SpO2 96%   BMI 20.07 kg/m   BP Readings from Last 3 Encounters:  10/29/18 126/74  04/27/18 132/70  07/09/17 138/82    Wt Readings from Last 3 Encounters:  10/29/18 148 lb (67.1 kg)  04/27/18 146 lb (66.2 kg)  07/09/17 153 lb (69.4 kg)    Physical Exam Constitutional:      General: He is not in acute distress.    Appearance: He is well-developed.     Comments: NAD  Eyes:     Conjunctiva/sclera: Conjunctivae normal.     Pupils: Pupils are equal, round, and reactive to light.  Neck:     Musculoskeletal: Normal range of motion.     Thyroid: No thyromegaly.     Vascular: No JVD.  Cardiovascular:     Rate and Rhythm: Normal rate and regular rhythm.     Heart sounds: Normal heart sounds. No murmur. No friction rub. No gallop.   Pulmonary:     Effort: Pulmonary effort is normal. No respiratory distress.     Breath sounds: Normal breath  sounds. No wheezing or rales.  Chest:     Chest wall: No tenderness.  Abdominal:     General: Bowel sounds are normal. There is no distension.     Palpations: Abdomen is soft. There is no mass.     Tenderness: There is no abdominal tenderness. There is no guarding or rebound.  Musculoskeletal: Normal range of motion.        General: No tenderness.  Lymphadenopathy:     Cervical: No cervical adenopathy.  Skin:    General: Skin is warm and dry.     Findings: No rash.  Neurological:     Mental Status: He is alert and oriented to person, place, and time.     Cranial Nerves: No cranial nerve deficit.     Motor: No  abnormal muscle tone.     Coordination: Coordination normal.     Gait: Gait normal.     Deep Tendon Reflexes: Reflexes are normal and symmetric.  Psychiatric:        Behavior: Behavior normal.        Thought Content: Thought content normal.        Judgment: Judgment normal.     Lab Results  Component Value Date   WBC 9.3 04/27/2018   HGB 14.1 04/27/2018   HCT 41.1 04/27/2018   PLT 271.0 04/27/2018   GLUCOSE 129 (H) 04/27/2018   CHOL 190 04/27/2018   TRIG 199.0 (H) 04/27/2018   HDL 47.80 04/27/2018   LDLDIRECT 157.0 05/30/2016   LDLCALC 102 (H) 04/27/2018   ALT 21 04/27/2018   AST 12 04/27/2018   NA 138 04/27/2018   K 3.8 04/27/2018   CL 105 04/27/2018   CREATININE 0.97 04/27/2018   BUN 14 04/27/2018   CO2 26 04/27/2018   TSH 1.59 04/27/2018   PSA 3.82 04/27/2018   HGBA1C 7.0 (H) 07/09/2017    Dg Chest 2 View  Result Date: 04/28/2018 CLINICAL DATA:  Weight loss. Chronic obstructive pulmonary disease, unspecified. EXAM: CHEST - 2 VIEW COMPARISON:  02/08/2010 FINDINGS: Lungs are clear without airspace disease or pulmonary edema. Heart and mediastinum are within normal limits. Atherosclerotic calcifications at the aortic arch. Trachea is midline. No pleural effusions. Bone structures are unremarkable. IMPRESSION: No active cardiopulmonary disease. Aortic atherosclerosis. Electronically Signed   By: Richarda Overlie M.D.   On: 04/28/2018 08:24    Assessment & Plan:   There are no diagnoses linked to this encounter.   Meds ordered this encounter  Medications  . HYDROcodone-acetaminophen (NORCO/VICODIN) 5-325 MG tablet    Sig: Take 1-2 tablets by mouth 2 (two) times daily as needed for severe pain.    Dispense:  100 tablet    Refill:  0     Follow-up: No follow-ups on file.  Sonda Primes, MD

## 2018-12-06 ENCOUNTER — Other Ambulatory Visit: Payer: Self-pay | Admitting: Internal Medicine

## 2019-02-25 ENCOUNTER — Ambulatory Visit (INDEPENDENT_AMBULATORY_CARE_PROVIDER_SITE_OTHER): Payer: Medicare Other | Admitting: Internal Medicine

## 2019-02-25 ENCOUNTER — Other Ambulatory Visit: Payer: Self-pay | Admitting: Internal Medicine

## 2019-02-25 ENCOUNTER — Other Ambulatory Visit: Payer: Self-pay

## 2019-02-25 ENCOUNTER — Encounter: Payer: Self-pay | Admitting: Internal Medicine

## 2019-02-25 DIAGNOSIS — I10 Essential (primary) hypertension: Secondary | ICD-10-CM

## 2019-02-25 DIAGNOSIS — E559 Vitamin D deficiency, unspecified: Secondary | ICD-10-CM | POA: Diagnosis not present

## 2019-02-25 DIAGNOSIS — M1000 Idiopathic gout, unspecified site: Secondary | ICD-10-CM

## 2019-02-25 DIAGNOSIS — F17200 Nicotine dependence, unspecified, uncomplicated: Secondary | ICD-10-CM

## 2019-02-25 MED ORDER — HYDROCODONE-ACETAMINOPHEN 5-325 MG PO TABS: 1.0000 | ORAL_TABLET | Freq: Two times a day (BID) | ORAL | 0 refills | Status: DC | PRN

## 2019-02-25 MED ORDER — PREDNISONE 10 MG PO TABS: ORAL_TABLET | ORAL | 0 refills | Status: DC

## 2019-02-25 MED ORDER — TADALAFIL 5 MG PO TABS: 5.0000 mg | ORAL_TABLET | Freq: Every day | ORAL | 11 refills | Status: DC

## 2019-02-25 MED ORDER — COLCHICINE 0.6 MG PO TABS: 0.6000 mg | ORAL_TABLET | Freq: Four times a day (QID) | ORAL | 3 refills | Status: DC | PRN

## 2019-02-25 MED ORDER — AMLODIPINE BESYLATE 5 MG PO TABS: 5.0000 mg | ORAL_TABLET | Freq: Every day | ORAL | 3 refills | Status: DC

## 2019-02-25 MED ORDER — ALLOPURINOL 300 MG PO TABS: 300.0000 mg | ORAL_TABLET | Freq: Every day | ORAL | 11 refills | Status: DC

## 2019-02-25 NOTE — Assessment & Plan Note (Signed)
Discussed.

## 2019-02-25 NOTE — Progress Notes (Signed)
Subjective:  Patient ID: Brent Turner, male    DOB: Mar 05, 1941  Age: 78 y.o. MRN: [id number]  CC: No chief complaint on file.   HPI Brent Turner presents for COPD, gout, BPH, OA f/u  Outpatient Medications Prior to Visit  Medication Sig Dispense Refill  . Cholecalciferol 1000 UNITS tablet Take 1,000 Units by mouth daily.      . tamsulosin (FLOMAX) 0.4 MG CAPS capsule Take 1 capsule (0.4 mg total) by mouth daily. 90 capsule 3  . triamcinolone cream (KENALOG) 0.5 % Apply 1 application topically 2 (two) times daily. 30 g 3  . allopurinol (ZYLOPRIM) 300 MG tablet Take 1 tablet (300 mg total) by mouth daily. 30 tablet 11  . amLODipine (NORVASC) 5 MG tablet Take 1 tablet (5 mg total) by mouth daily. 90 tablet 3  . colchicine 0.6 MG tablet Take 1 tablet (0.6 mg total) by mouth 4 (four) times daily as needed (gout attack). For gout 60 tablet 3  . HYDROcodone-acetaminophen (NORCO/VICODIN) 5-325 MG tablet Take 1-2 tablets by mouth 2 (two) times daily as needed for severe pain. 100 tablet 0  . predniSONE (DELTASONE) 10 MG tablet TAKE 2 TABLETS BY MOUTH DAILY FOR 2-3 DAYS AS NEEDED FOR GOUT 60 tablet 0  . tadalafil (CIALIS) 5 MG tablet Take 1 tablet (5 mg total) by mouth daily. 30 tablet 11   No facility-administered medications prior to visit.     ROS: Review of Systems  Constitutional: Positive for fatigue. Negative for appetite change and unexpected weight change.  HENT: Negative for congestion, nosebleeds, sneezing, sore throat and trouble swallowing.   Eyes: Negative for itching and visual disturbance.  Respiratory: Negative for cough.   Cardiovascular: Negative for chest pain, palpitations and leg swelling.  Gastrointestinal: Negative for abdominal distention, blood in stool, diarrhea and nausea.  Genitourinary: Negative for frequency and hematuria.  Musculoskeletal: Positive for arthralgias and back pain. Negative for gait problem, joint swelling and neck pain.  Skin: Negative for  rash.  Neurological: Negative for dizziness, tremors, speech difficulty and weakness.  Psychiatric/Behavioral: Negative for agitation, dysphoric mood, sleep disturbance and suicidal ideas. The patient is not nervous/anxious.     Objective:  BP 132/80 (BP Location: Left Arm, Patient Position: Sitting, Cuff Size: Normal)   Pulse 89   Temp 98.2 F (36.8 C) (Oral)   Ht 6' (1.829 m)   Wt 146 lb (66.2 kg)   SpO2 98%   BMI 19.80 kg/m   BP Readings from Last 3 Encounters:  02/25/19 132/80  10/29/18 126/74  04/27/18 132/70    Wt Readings from Last 3 Encounters:  02/25/19 146 lb (66.2 kg)  10/29/18 148 lb (67.1 kg)  04/27/18 146 lb (66.2 kg)    Physical Exam Constitutional:      General: He is not in acute distress.    Appearance: He is well-developed.     Comments: NAD  Eyes:     Conjunctiva/sclera: Conjunctivae normal.     Pupils: Pupils are equal, round, and reactive to light.  Neck:     Musculoskeletal: Normal range of motion.     Thyroid: No thyromegaly.     Vascular: No JVD.  Cardiovascular:     Rate and Rhythm: Normal rate and regular rhythm.     Heart sounds: Normal heart sounds. No murmur. No friction rub. No gallop.   Pulmonary:     Effort: Pulmonary effort is normal. No respiratory distress.     Breath sounds: Normal breath sounds. No  wheezing or rales.  Chest:     Chest wall: No tenderness.  Abdominal:     General: Bowel sounds are normal. There is no distension.     Palpations: Abdomen is soft. There is no mass.     Tenderness: There is no abdominal tenderness. There is no guarding or rebound.  Musculoskeletal: Normal range of motion.        General: No tenderness.  Lymphadenopathy:     Cervical: No cervical adenopathy.  Skin:    General: Skin is warm and dry.     Findings: No rash.  Neurological:     Mental Status: He is alert and oriented to person, place, and time.     Cranial Nerves: No cranial nerve deficit.     Motor: No abnormal muscle tone.      Coordination: Coordination normal.     Gait: Gait normal.     Deep Tendon Reflexes: Reflexes are normal and symmetric.  Psychiatric:        Behavior: Behavior normal.        Thought Content: Thought content normal.        Judgment: Judgment normal.     Lab Results  Component Value Date   WBC 9.3 04/27/2018   HGB 14.1 04/27/2018   HCT 41.1 04/27/2018   PLT 271.0 04/27/2018   GLUCOSE 129 (H) 04/27/2018   CHOL 190 04/27/2018   TRIG 199.0 (H) 04/27/2018   HDL 47.80 04/27/2018   LDLDIRECT 157.0 05/30/2016   LDLCALC 102 (H) 04/27/2018   ALT 21 04/27/2018   AST 12 04/27/2018   NA 138 04/27/2018   K 3.8 04/27/2018   CL 105 04/27/2018   CREATININE 0.97 04/27/2018   BUN 14 04/27/2018   CO2 26 04/27/2018   TSH 1.59 04/27/2018   PSA 3.82 04/27/2018   HGBA1C 7.0 (H) 07/09/2017    Dg Chest 2 View  Result Date: 04/28/2018 CLINICAL DATA:  Weight loss. Chronic obstructive pulmonary disease, unspecified. EXAM: CHEST - 2 VIEW COMPARISON:  02/08/2010 FINDINGS: Lungs are clear without airspace disease or pulmonary edema. Heart and mediastinum are within normal limits. Atherosclerotic calcifications at the aortic arch. Trachea is midline. No pleural effusions. Bone structures are unremarkable. IMPRESSION: No active cardiopulmonary disease. Aortic atherosclerosis. Electronically Signed   By: Richarda OverlieAdam  Henn M.D.   On: 04/28/2018 08:24    Assessment & Plan:   There are no diagnoses linked to this encounter.   Meds ordered this encounter  Medications  . allopurinol (ZYLOPRIM) 300 MG tablet    Sig: Take 1 tablet (300 mg total) by mouth daily.    Dispense:  30 tablet    Refill:  11  . amLODipine (NORVASC) 5 MG tablet    Sig: Take 1 tablet (5 mg total) by mouth daily.    Dispense:  90 tablet    Refill:  3  . colchicine 0.6 MG tablet    Sig: Take 1 tablet (0.6 mg total) by mouth 4 (four) times daily as needed (gout attack). For gout    Dispense:  60 tablet    Refill:  3  .  HYDROcodone-acetaminophen (NORCO/VICODIN) 5-325 MG tablet    Sig: Take 1-2 tablets by mouth 2 (two) times daily as needed for severe pain.    Dispense:  100 tablet    Refill:  0  . predniSONE (DELTASONE) 10 MG tablet    Sig: TAKE 2 TABLETS BY MOUTH DAILY FOR 2-3 DAYS AS NEEDED FOR GOUT    Dispense:  60  tablet    Refill:  0  . tadalafil (CIALIS) 5 MG tablet    Sig: Take 1 tablet (5 mg total) by mouth daily.    Dispense:  30 tablet    Refill:  11     Follow-up: No follow-ups on file.  Sonda PrimesAlex [name], MD

## 2019-02-25 NOTE — Assessment & Plan Note (Signed)
Amlodipine A cardiac CT scan for calcium scoring offered

## 2019-02-25 NOTE — Assessment & Plan Note (Signed)
Risks associated with treatment noncompliance were discussed. Compliance was encouraged. 

## 2019-02-25 NOTE — Assessment & Plan Note (Signed)
Norco prn  Potential benefits of a long term opioids use as well as potential risks (i.e. addiction risk, apnea etc) and complications (i.e. Somnolence, constipation and others) were explained to the patient and were aknowledged. 

## 2019-03-02 ENCOUNTER — Other Ambulatory Visit: Payer: Self-pay | Admitting: Internal Medicine

## 2019-03-02 ENCOUNTER — Other Ambulatory Visit: Payer: Self-pay

## 2019-03-02 MED ORDER — COLCHICINE 0.6 MG PO CAPS: 1.0000 | ORAL_CAPSULE | Freq: Four times a day (QID) | ORAL | 3 refills | Status: DC | PRN

## 2019-08-19 ENCOUNTER — Encounter: Payer: Self-pay | Admitting: Internal Medicine

## 2019-08-19 ENCOUNTER — Other Ambulatory Visit: Payer: Self-pay

## 2019-08-19 ENCOUNTER — Ambulatory Visit (INDEPENDENT_AMBULATORY_CARE_PROVIDER_SITE_OTHER): Payer: Medicare Other | Admitting: Internal Medicine

## 2019-08-19 VITALS — BP 140/82 | HR 82 | Temp 98.6°F | Ht 72.0 in | Wt 145.0 lb

## 2019-08-19 DIAGNOSIS — Z23 Encounter for immunization: Secondary | ICD-10-CM

## 2019-08-19 DIAGNOSIS — E559 Vitamin D deficiency, unspecified: Secondary | ICD-10-CM

## 2019-08-19 DIAGNOSIS — N320 Bladder-neck obstruction: Secondary | ICD-10-CM | POA: Diagnosis not present

## 2019-08-19 DIAGNOSIS — M1000 Idiopathic gout, unspecified site: Secondary | ICD-10-CM

## 2019-08-19 DIAGNOSIS — R634 Abnormal weight loss: Secondary | ICD-10-CM | POA: Diagnosis not present

## 2019-08-19 DIAGNOSIS — I10 Essential (primary) hypertension: Secondary | ICD-10-CM

## 2019-08-19 MED ORDER — PREDNISONE 10 MG PO TABS: ORAL_TABLET | ORAL | 0 refills | Status: DC

## 2019-08-19 MED ORDER — HYDROCODONE-ACETAMINOPHEN 5-325 MG PO TABS: 1.0000 | ORAL_TABLET | Freq: Two times a day (BID) | ORAL | 0 refills | Status: DC | PRN

## 2019-08-19 NOTE — Assessment & Plan Note (Signed)
-   Flomax 

## 2019-08-19 NOTE — Assessment & Plan Note (Signed)
Wt Readings from Last 3 Encounters:  08/19/19 145 lb (65.8 kg)  02/25/19 146 lb (66.2 kg)  10/29/18 148 lb (67.1 kg)

## 2019-08-19 NOTE — Assessment & Plan Note (Signed)
Amlodipine.

## 2019-08-19 NOTE — Assessment & Plan Note (Signed)
Prednisone prn Norco prn  Potential benefits of a long term opioids use as well as potential risks (i.e. addiction risk, apnea etc) and complications (i.e. Somnolence, constipation and others) were explained to the patient and were aknowledged.

## 2019-08-19 NOTE — Progress Notes (Signed)
Subjective:  Patient ID: Brent Turner, male    DOB: 25-Nov-1940  Age: 78 y.o. MRN: [id number]  CC: No chief complaint on file.   HPI Brent Turner presents for HTN, COPD, gout f/u  Outpatient Medications Prior to Visit  Medication Sig Dispense Refill  . allopurinol (ZYLOPRIM) 300 MG tablet Take 1 tablet (300 mg total) by mouth daily. 30 tablet 11  . amLODipine (NORVASC) 5 MG tablet Take 1 tablet (5 mg total) by mouth daily. 90 tablet 3  . Cholecalciferol 1000 UNITS tablet Take 1,000 Units by mouth daily.      Marland Kitchen HYDROcodone-acetaminophen (NORCO/VICODIN) 5-325 MG tablet Take 1-2 tablets by mouth 2 (two) times daily as needed for severe pain. 100 tablet 0  . MITIGARE 0.6 MG CAPS Please specify directions, refills and quantity 60 capsule 1  . predniSONE (DELTASONE) 10 MG tablet TAKE 2 TABLETS BY MOUTH DAILY FOR 2-3 DAYS AS NEEDED FOR GOUT 60 tablet 0  . tadalafil (CIALIS) 5 MG tablet Take 1 tablet (5 mg total) by mouth daily. 30 tablet 11  . tamsulosin (FLOMAX) 0.4 MG CAPS capsule Take 1 capsule (0.4 mg total) by mouth daily. 90 capsule 3  . triamcinolone cream (KENALOG) 0.5 % Apply 1 application topically 2 (two) times daily. 30 g 3   No facility-administered medications prior to visit.    ROS: Review of Systems  Constitutional: Negative for appetite change, fatigue and unexpected weight change.  HENT: Negative for congestion, nosebleeds, sneezing, sore throat and trouble swallowing.   Eyes: Negative for itching and visual disturbance.  Respiratory: Negative for cough.   Cardiovascular: Negative for chest pain, palpitations and leg swelling.  Gastrointestinal: Negative for abdominal distention, blood in stool, diarrhea and nausea.  Genitourinary: Negative for frequency and hematuria.  Musculoskeletal: Positive for arthralgias and back pain. Negative for gait problem, joint swelling and neck pain.  Skin: Negative for rash.  Neurological: Negative for dizziness, tremors, speech  difficulty and weakness.  Psychiatric/Behavioral: Negative for agitation, dysphoric mood, sleep disturbance and suicidal ideas. The patient is not nervous/anxious.     Objective:  BP 140/82 (BP Location: Left Arm, Patient Position: Sitting, Cuff Size: Normal)   Pulse 82   Temp 98.6 F (37 C) (Oral)   Ht 6' (1.829 m)   Wt 145 lb (65.8 kg)   SpO2 99%   BMI 19.67 kg/m   BP Readings from Last 3 Encounters:  08/19/19 140/82  02/25/19 132/80  10/29/18 126/74    Wt Readings from Last 3 Encounters:  08/19/19 145 lb (65.8 kg)  02/25/19 146 lb (66.2 kg)  10/29/18 148 lb (67.1 kg)    Physical Exam Constitutional:      General: He is not in acute distress.    Appearance: He is well-developed.     Comments: NAD  Eyes:     Conjunctiva/sclera: Conjunctivae normal.     Pupils: Pupils are equal, round, and reactive to light.  Neck:     Thyroid: No thyromegaly.     Vascular: No JVD.  Cardiovascular:     Rate and Rhythm: Normal rate and regular rhythm.     Heart sounds: Normal heart sounds. No murmur. No friction rub. No gallop.   Pulmonary:     Effort: Pulmonary effort is normal. No respiratory distress.     Breath sounds: Normal breath sounds. No wheezing or rales.  Chest:     Chest wall: No tenderness.  Abdominal:     General: Bowel sounds are normal. There  is no distension.     Palpations: Abdomen is soft. There is no mass.     Tenderness: There is no abdominal tenderness. There is no guarding or rebound.  Musculoskeletal:        General: No tenderness. Normal range of motion.     Cervical back: Normal range of motion.  Lymphadenopathy:     Cervical: No cervical adenopathy.  Skin:    General: Skin is warm and dry.     Findings: No rash.  Neurological:     Mental Status: He is alert and oriented to person, place, and time.     Cranial Nerves: No cranial nerve deficit.     Motor: No abnormal muscle tone.     Coordination: Coordination normal.     Gait: Gait normal.      Deep Tendon Reflexes: Reflexes are normal and symmetric.  Psychiatric:        Behavior: Behavior normal.        Thought Content: Thought content normal.        Judgment: Judgment normal.     Lab Results  Component Value Date   WBC 9.3 04/27/2018   HGB 14.1 04/27/2018   HCT 41.1 04/27/2018   PLT 271.0 04/27/2018   GLUCOSE 129 (H) 04/27/2018   CHOL 190 04/27/2018   TRIG 199.0 (H) 04/27/2018   HDL 47.80 04/27/2018   LDLDIRECT 157.0 05/30/2016   LDLCALC 102 (H) 04/27/2018   ALT 21 04/27/2018   AST 12 04/27/2018   NA 138 04/27/2018   K 3.8 04/27/2018   CL 105 04/27/2018   CREATININE 0.97 04/27/2018   BUN 14 04/27/2018   CO2 26 04/27/2018   TSH 1.59 04/27/2018   PSA 3.82 04/27/2018   HGBA1C 7.0 (H) 07/09/2017    DG Chest 2 View  Result Date: 04/28/2018 CLINICAL DATA:  Weight loss. Chronic obstructive pulmonary disease, unspecified. EXAM: CHEST - 2 VIEW COMPARISON:  02/08/2010 FINDINGS: Lungs are clear without airspace disease or pulmonary edema. Heart and mediastinum are within normal limits. Atherosclerotic calcifications at the aortic arch. Trachea is midline. No pleural effusions. Bone structures are unremarkable. IMPRESSION: No active cardiopulmonary disease. Aortic atherosclerosis. Electronically Signed   By: Markus Daft M.D.   On: 04/28/2018 08:24    Assessment & Plan:   There are no diagnoses linked to this encounter.   No orders of the defined types were placed in this encounter.    Follow-up: No follow-ups on file.  Walker Kehr, MD

## 2019-08-19 NOTE — Addendum Note (Signed)
Addended by: Karren Cobble on: 08/19/2019 04:14 PM   Modules accepted: Orders

## 2019-08-19 NOTE — Assessment & Plan Note (Signed)
Vit D 

## 2020-01-07 ENCOUNTER — Other Ambulatory Visit: Payer: Self-pay | Admitting: Internal Medicine

## 2020-02-02 DIAGNOSIS — Z23 Encounter for immunization: Secondary | ICD-10-CM | POA: Diagnosis not present

## 2020-02-14 ENCOUNTER — Other Ambulatory Visit (INDEPENDENT_AMBULATORY_CARE_PROVIDER_SITE_OTHER): Payer: Medicare Other

## 2020-02-14 ENCOUNTER — Other Ambulatory Visit: Payer: Self-pay

## 2020-02-14 DIAGNOSIS — M1000 Idiopathic gout, unspecified site: Secondary | ICD-10-CM | POA: Diagnosis not present

## 2020-02-14 DIAGNOSIS — R634 Abnormal weight loss: Secondary | ICD-10-CM

## 2020-02-14 DIAGNOSIS — I10 Essential (primary) hypertension: Secondary | ICD-10-CM

## 2020-02-14 DIAGNOSIS — N320 Bladder-neck obstruction: Secondary | ICD-10-CM

## 2020-02-14 LAB — BASIC METABOLIC PANEL
BUN: 17 mg/dL (ref 6–23)
CO2: 26 mEq/L (ref 19–32)
Calcium: 9.3 mg/dL (ref 8.4–10.5)
Chloride: 103 mEq/L (ref 96–112)
Creatinine, Ser: 1.14 mg/dL (ref 0.40–1.50)
GFR: 62.04 mL/min (ref >60.00)
Glucose, Bld: 91 mg/dL (ref 70–99)
Potassium: 3.9 mEq/L (ref 3.5–5.1)
Sodium: 139 mEq/L (ref 135–145)

## 2020-02-14 LAB — CBC WITH DIFFERENTIAL/PLATELET
Basophils Absolute: 0 10*3/uL (ref 0.0–0.1)
Basophils Relative: 0.4 % (ref 0.0–3.0)
Eosinophils Absolute: 0.1 10*3/uL (ref 0.0–0.7)
Eosinophils Relative: 0.8 % (ref 0.0–5.0)
HCT: 42.2 % (ref 39.0–52.0)
Hemoglobin: 14.5 g/dL (ref 13.0–17.0)
Lymphocytes Relative: 43 % (ref 12.0–46.0)
Lymphs Abs: 3.8 10*3/uL (ref 0.7–4.0)
MCHC: 34.3 g/dL (ref 30.0–36.0)
MCV: 101.6 fl — ABNORMAL HIGH (ref 78.0–100.0)
Monocytes Absolute: 0.5 10*3/uL (ref 0.1–1.0)
Monocytes Relative: 5.8 % (ref 3.0–12.0)
Neutro Abs: 4.4 10*3/uL (ref 1.4–7.7)
Neutrophils Relative %: 50 % (ref 43.0–77.0)
Platelets: 281 10*3/uL (ref 150.0–400.0)
RBC: 4.15 Mil/uL — ABNORMAL LOW (ref 4.22–5.81)
RDW: 13.7 % (ref 11.5–15.5)
WBC: 8.8 10*3/uL (ref 4.0–10.5)

## 2020-02-14 LAB — LIPID PANEL
Cholesterol: 211 mg/dL — ABNORMAL HIGH (ref 0–200)
HDL: 54.1 mg/dL (ref >39.00)
Total CHOL/HDL Ratio: 4
Triglycerides: 446 mg/dL — ABNORMAL HIGH (ref 0.0–149.0)

## 2020-02-14 LAB — HEPATIC FUNCTION PANEL
ALT: 22 U/L (ref 0–53)
AST: 19 U/L (ref 0–37)
Albumin: 3.9 g/dL (ref 3.5–5.2)
Alkaline Phosphatase: 70 U/L (ref 39–117)
Bilirubin, Direct: 0.1 mg/dL (ref 0.0–0.3)
Total Bilirubin: 0.4 mg/dL (ref 0.2–1.2)
Total Protein: 6.5 g/dL (ref 6.0–8.3)

## 2020-02-14 LAB — TSH: TSH: 3.75 u[IU]/mL (ref 0.35–4.50)

## 2020-02-14 LAB — PSA: PSA: 4.93 ng/mL — ABNORMAL HIGH (ref 0.10–4.00)

## 2020-02-14 LAB — LDL CHOLESTEROL, DIRECT: Direct LDL: 106 mg/dL

## 2020-02-15 LAB — URINALYSIS, ROUTINE W REFLEX MICROSCOPIC
Bilirubin Urine: NEGATIVE
Ketones, ur: NEGATIVE
Leukocytes,Ua: NEGATIVE
Nitrite: NEGATIVE
Specific Gravity, Urine: 1.02 (ref 1.000–1.030)
Total Protein, Urine: NEGATIVE
Urine Glucose: NEGATIVE
Urobilinogen, UA: 0.2 (ref 0.0–1.0)
pH: 6 (ref 5.0–8.0)

## 2020-02-17 ENCOUNTER — Encounter: Payer: Self-pay | Admitting: Internal Medicine

## 2020-02-17 ENCOUNTER — Other Ambulatory Visit: Payer: Self-pay

## 2020-02-17 ENCOUNTER — Ambulatory Visit (INDEPENDENT_AMBULATORY_CARE_PROVIDER_SITE_OTHER): Payer: Medicare Other

## 2020-02-17 ENCOUNTER — Ambulatory Visit (INDEPENDENT_AMBULATORY_CARE_PROVIDER_SITE_OTHER): Payer: Medicare Other | Admitting: Internal Medicine

## 2020-02-17 VITALS — BP 148/82 | HR 76 | Temp 98.3°F | Ht 72.0 in | Wt 137.2 lb

## 2020-02-17 VITALS — BP 148/82 | HR 76 | Temp 98.3°F | Ht 72.0 in | Wt 137.4 lb

## 2020-02-17 DIAGNOSIS — R972 Elevated prostate specific antigen [PSA]: Secondary | ICD-10-CM | POA: Diagnosis not present

## 2020-02-17 DIAGNOSIS — E785 Hyperlipidemia, unspecified: Secondary | ICD-10-CM

## 2020-02-17 DIAGNOSIS — F17200 Nicotine dependence, unspecified, uncomplicated: Secondary | ICD-10-CM | POA: Diagnosis not present

## 2020-02-17 DIAGNOSIS — M1000 Idiopathic gout, unspecified site: Secondary | ICD-10-CM

## 2020-02-17 DIAGNOSIS — I10 Essential (primary) hypertension: Secondary | ICD-10-CM

## 2020-02-17 DIAGNOSIS — R634 Abnormal weight loss: Secondary | ICD-10-CM

## 2020-02-17 DIAGNOSIS — M5430 Sciatica, unspecified side: Secondary | ICD-10-CM | POA: Diagnosis not present

## 2020-02-17 DIAGNOSIS — Z0000 Encounter for general adult medical examination without abnormal findings: Secondary | ICD-10-CM | POA: Diagnosis not present

## 2020-02-17 IMAGING — DX DG CHEST 2V
2 series · 2 of 2 positions shown · non-contrast
Comparison: [DATE].

CLINICAL DATA: COPD.  Weight loss.

EXAM:
CHEST - 2 VIEW

[chest pa]
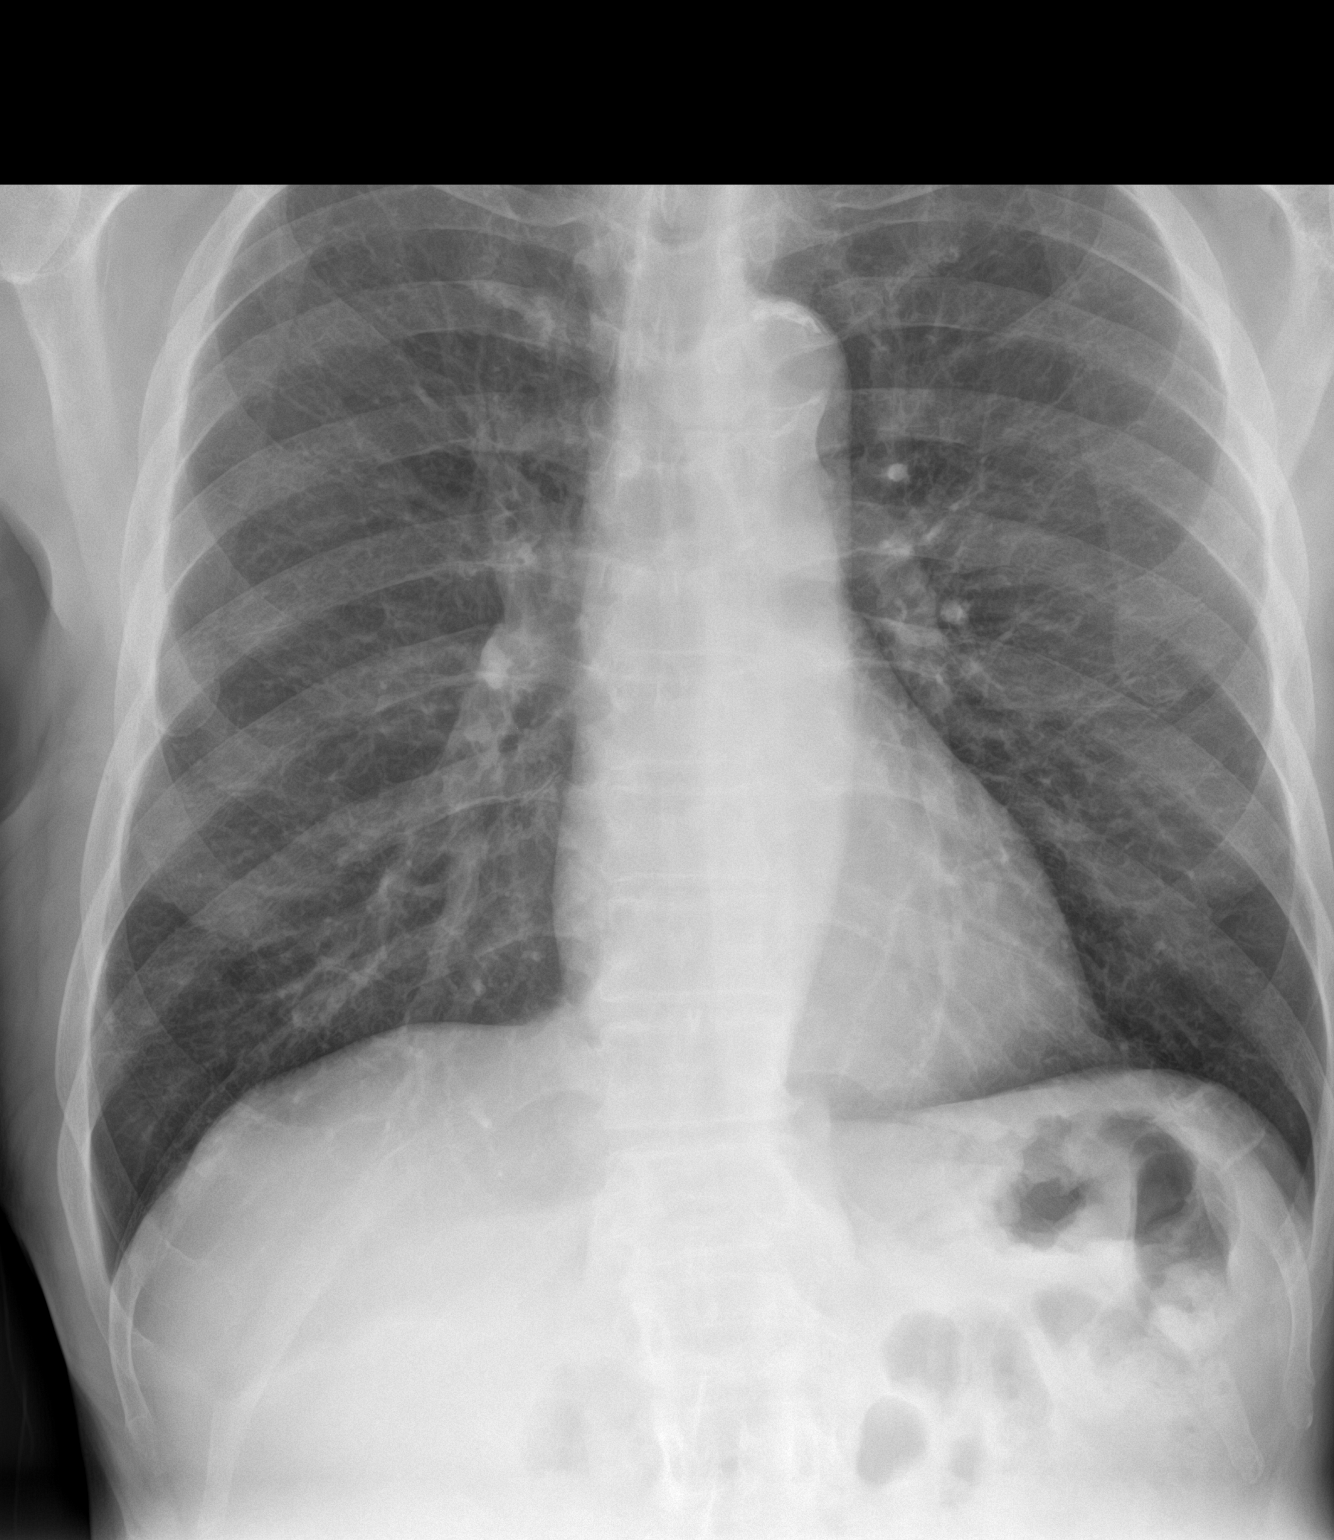

[chest lat]
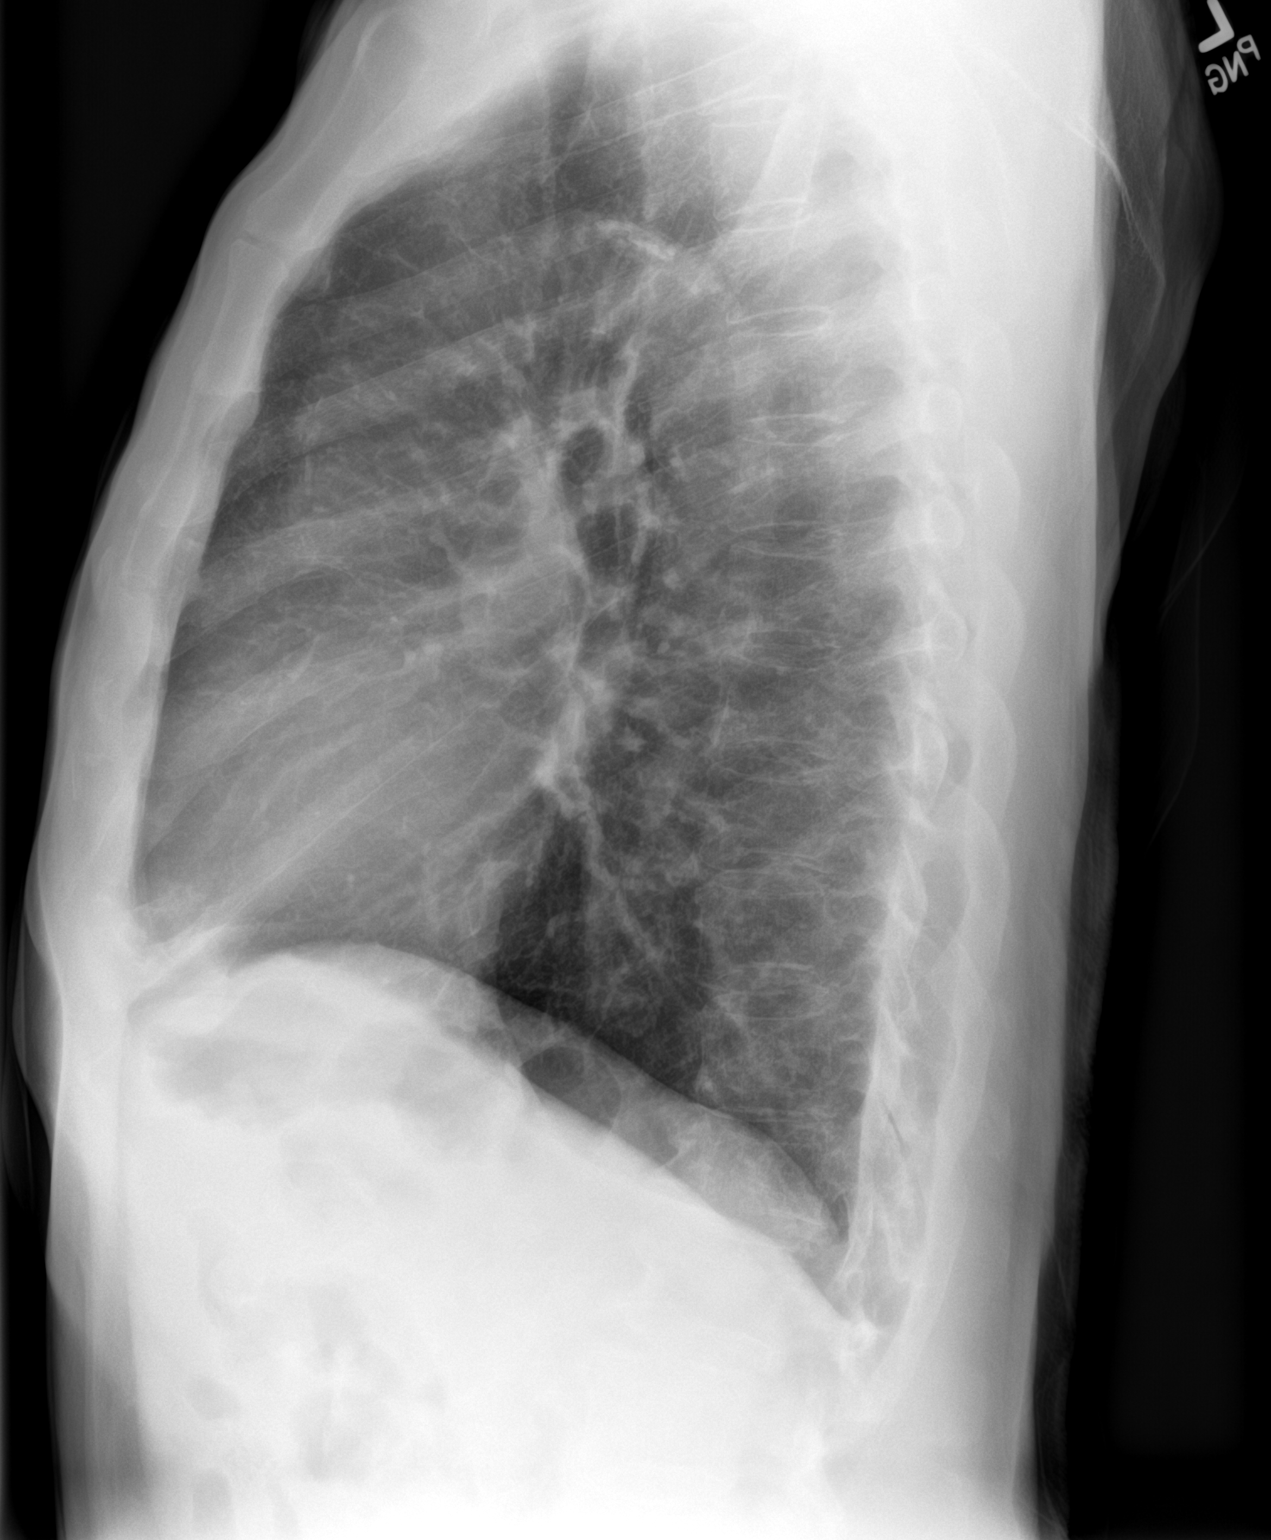

[2 of 2 positions shown; findings below may reference images not displayed]

FINDINGS: Mediastinum and hilar structures normal. Heart size normal. Lungs
are clear. No pleural effusion or pneumothorax. Degenerative change
thoracic spine. No acute bony abnormality identified.
IMPRESSION: No acute cardiopulmonary disease.

## 2020-02-17 MED ORDER — TAMSULOSIN HCL 0.4 MG PO CAPS: 0.4000 mg | ORAL_CAPSULE | Freq: Every day | ORAL | 3 refills | Status: DC

## 2020-02-17 MED ORDER — TRIAMCINOLONE ACETONIDE 0.5 % EX CREA: 1.0000 "application " | TOPICAL_CREAM | Freq: Two times a day (BID) | CUTANEOUS | 3 refills | Status: DC

## 2020-02-17 MED ORDER — PREDNISONE 10 MG PO TABS: ORAL_TABLET | ORAL | 0 refills | Status: DC

## 2020-02-17 MED ORDER — HYDROCODONE-ACETAMINOPHEN 5-325 MG PO TABS: 1.0000 | ORAL_TABLET | Freq: Two times a day (BID) | ORAL | 0 refills | Status: DC | PRN

## 2020-02-17 MED ORDER — AMLODIPINE BESYLATE 5 MG PO TABS: 5.0000 mg | ORAL_TABLET | Freq: Every day | ORAL | 3 refills | Status: DC

## 2020-02-17 MED ORDER — ALLOPURINOL 300 MG PO TABS: 300.0000 mg | ORAL_TABLET | Freq: Every day | ORAL | 11 refills | Status: DC

## 2020-02-17 NOTE — Assessment & Plan Note (Signed)
CXR Smoke less

## 2020-02-17 NOTE — Assessment & Plan Note (Signed)
Re-test in 3 mo Urol ref offered

## 2020-02-17 NOTE — Assessment & Plan Note (Addendum)
1PPD discussed

## 2020-02-17 NOTE — Assessment & Plan Note (Signed)
Steroids prn 

## 2020-02-17 NOTE — Assessment & Plan Note (Signed)
Re-test in 3 mo Worse Diet, meds discussed

## 2020-02-17 NOTE — Patient Instructions (Signed)
Brent Turner , Thank you for taking time to come for your Medicare Wellness Visit. I appreciate your ongoing commitment to your health goals. Please review the following plan we discussed and let me know if I can assist you in the future.   Screening recommendations/referrals: Colonoscopy: never done Recommended yearly ophthalmology/optometry visit for glaucoma screening and checkup Recommended yearly dental visit for hygiene and checkup  Vaccinations: Influenza vaccine: 08/19/2019; due once a year Pneumococcal vaccine: need Prevnar13 Tdap vaccine: never done Shingles vaccine: never done   Covid-19: never done  Advanced directives: Please bring a copy of your health care power of attorney and living will to the office at your convenience.  Conditions/risks identified: Please continue to do your personal lifestyle choices by: daily care of teeth and gums, regular physical activity (goal should be 5 days a week for 30 minutes), eat a healthy diet, avoid tobacco and drug use, limiting any alcohol intake, taking a low-dose aspirin (if not allergic or have been advised by your provider otherwise) and taking vitamins and minerals as recommended by your provider.  Next appointment: Please schedule your next Medicare Wellness Visit with your Nurse Health Advisor in 1 year.  Preventive Care 25 Years and Older, Male Preventive care refers to lifestyle choices and visits with your health care provider that can promote health and wellness. What does preventive care include?  A yearly physical exam. This is also called an annual well check.  Dental exams once or twice a year.  Routine eye exams. Ask your health care provider how often you should have your eyes checked.  Personal lifestyle choices, including:  Daily care of your teeth and gums.  Regular physical activity.  Eating a healthy diet.  Avoiding tobacco and drug use.  Limiting alcohol use.  Practicing safe sex.  Taking low  doses of aspirin every day.  Taking vitamin and mineral supplements as recommended by your health care provider. What happens during an annual well check? The services and screenings done by your health care provider during your annual well check will depend on your age, overall health, lifestyle risk factors, and family history of disease. Counseling  Your health care provider may ask you questions about your:  Alcohol use.  Tobacco use.  Drug use.  Emotional well-being.  Home and relationship well-being.  Sexual activity.  Eating habits.  History of falls.  Memory and ability to understand (cognition).  Work and work Astronomer. Screening  You may have the following tests or measurements:  Height, weight, and BMI.  Blood pressure.  Lipid and cholesterol levels. These may be checked every 5 years, or more frequently if you are over 37 years old.  Skin check.  Lung cancer screening. You may have this screening every year starting at age 17 if you have a 30-pack-year history of smoking and currently smoke or have quit within the past 15 years.  Fecal occult blood test (FOBT) of the stool. You may have this test every year starting at age 68.  Flexible sigmoidoscopy or colonoscopy. You may have a sigmoidoscopy every 5 years or a colonoscopy every 10 years starting at age 59.  Prostate cancer screening. Recommendations will vary depending on your family history and other risks.  Hepatitis C blood test.  Hepatitis B blood test.  Sexually transmitted disease (STD) testing.  Diabetes screening. This is done by checking your blood sugar (glucose) after you have not eaten for a while (fasting). You may have this done every 1-3 years.  Abdominal aortic aneurysm (AAA) screening. You may need this if you are a current or former smoker.  Osteoporosis. You may be screened starting at age 33 if you are at high risk. Talk with your health care provider about your test  results, treatment options, and if necessary, the need for more tests. Vaccines  Your health care provider may recommend certain vaccines, such as:  Influenza vaccine. This is recommended every year.  Tetanus, diphtheria, and acellular pertussis (Tdap, Td) vaccine. You may need a Td booster every 10 years.  Zoster vaccine. You may need this after age 49.  Pneumococcal 13-valent conjugate (PCV13) vaccine. One dose is recommended after age 32.  Pneumococcal polysaccharide (PPSV23) vaccine. One dose is recommended after age 42. Talk to your health care provider about which screenings and vaccines you need and how often you need them. This information is not intended to replace advice given to you by your health care provider. Make sure you discuss any questions you have with your health care provider. Document Released: 09/15/2015 Document Revised: 05/08/2016 Document Reviewed: 06/20/2015 Elsevier Interactive Patient Education  2017 [employer] Prevention in the Home Falls can cause injuries. They can happen to people of all ages. There are many things you can do to make your home safe and to help prevent falls. What can I do on the outside of my home?  Regularly fix the edges of walkways and driveways and fix any cracks.  Remove anything that might make you trip as you walk through a door, such as a raised step or threshold.  Trim any bushes or trees on the path to your home.  Use bright outdoor lighting.  Clear any walking paths of anything that might make someone trip, such as rocks or tools.  Regularly check to see if handrails are loose or broken. Make sure that both sides of any steps have handrails.  Any raised decks and porches should have guardrails on the edges.  Have any leaves, snow, or ice cleared regularly.  Use sand or salt on walking paths during winter.  Clean up any spills in your garage right away. This includes oil or grease spills. What can I do in  the bathroom?  Use night lights.  Install grab bars by the toilet and in the tub and shower. Do not use towel bars as grab bars.  Use non-skid mats or decals in the tub or shower.  If you need to sit down in the shower, use a plastic, non-slip stool.  Keep the floor dry. Clean up any water that spills on the floor as soon as it happens.  Remove soap buildup in the tub or shower regularly.  Attach bath mats securely with double-sided non-slip rug tape.  Do not have throw rugs and other things on the floor that can make you trip. What can I do in the bedroom?  Use night lights.  Make sure that you have a light by your bed that is easy to reach.  Do not use any sheets or blankets that are too big for your bed. They should not hang down onto the floor.  Have a firm chair that has side arms. You can use this for support while you get dressed.  Do not have throw rugs and other things on the floor that can make you trip. What can I do in the kitchen?  Clean up any spills right away.  Avoid walking on wet floors.  Keep items that you use  a lot in easy-to-reach places.  If you need to reach something above you, use a strong step stool that has a grab bar.  Keep electrical cords out of the way.  Do not use floor polish or wax that makes floors slippery. If you must use wax, use non-skid floor wax.  Do not have throw rugs and other things on the floor that can make you trip. What can I do with my stairs?  Do not leave any items on the stairs.  Make sure that there are handrails on both sides of the stairs and use them. Fix handrails that are broken or loose. Make sure that handrails are as long as the stairways.  Check any carpeting to make sure that it is firmly attached to the stairs. Fix any carpet that is loose or worn.  Avoid having throw rugs at the top or bottom of the stairs. If you do have throw rugs, attach them to the floor with carpet tape.  Make sure that you  have a light switch at the top of the stairs and the bottom of the stairs. If you do not have them, ask someone to add them for you. What else can I do to help prevent falls?  Wear shoes that:  Do not have high heels.  Have rubber bottoms.  Are comfortable and fit you well.  Are closed at the toe. Do not wear sandals.  If you use a stepladder:  Make sure that it is fully opened. Do not climb a closed stepladder.  Make sure that both sides of the stepladder are locked into place.  Ask someone to hold it for you, if possible.  Clearly mark and make sure that you can see:  Any grab bars or handrails.  First and last steps.  Where the edge of each step is.  Use tools that help you move around (mobility aids) if they are needed. These include:  Canes.  Walkers.  Scooters.  Crutches.  Turn on the lights when you go into a dark area. Replace any light bulbs as soon as they burn out.  Set up your furniture so you have a clear path. Avoid moving your furniture around.  If any of your floors are uneven, fix them.  If there are any pets around you, be aware of where they are.  Review your medicines with your doctor. Some medicines can make you feel dizzy. This can increase your chance of falling. Ask your doctor what other things that you can do to help prevent falls. This information is not intended to replace advice given to you by your health care provider. Make sure you discuss any questions you have with your health care provider. Document Released: 06/15/2009 Document Revised: 01/25/2016 Document Reviewed: 09/23/2014 Elsevier Interactive Patient Education  2017 [employer].

## 2020-02-17 NOTE — Assessment & Plan Note (Signed)
BP Readings from Last 3 Encounters:  02/17/20 (!) 148/82  02/17/20 (!) 148/82  08/19/19 140/82

## 2020-02-17 NOTE — Assessment & Plan Note (Signed)
New Move wallet to another pocket For sciatica - take Prednisone 40 mg a day x 3 days, then 20 mg/d x 3 d, then 10 mg/d x 3 d, then stop

## 2020-02-17 NOTE — Progress Notes (Signed)
Subjective:  Patient ID: Brent Turner, male    DOB: [birth year]  Age: 79 y.o. MRN: [id number]  CC: No chief complaint on file.   HPI Brent Turner presents for HTN, gout, BPH f/u C/o LBP, LLE pain x 1-2 months - severe No appetite - lost wt  Outpatient Medications Prior to Visit  Medication Sig Dispense Refill  . allopurinol (ZYLOPRIM) 300 MG tablet Take 1 tablet (300 mg total) by mouth daily. 30 tablet 11  . amLODipine (NORVASC) 5 MG tablet Take 1 tablet (5 mg total) by mouth daily. 90 tablet 3  . Cholecalciferol 1000 UNITS tablet Take 1,000 Units by mouth daily.      Marland Kitchen HYDROcodone-acetaminophen (NORCO/VICODIN) 5-325 MG tablet Take 1-2 tablets by mouth 2 (two) times daily as needed for severe pain. 100 tablet 0  . MITIGARE 0.6 MG CAPS Please specify directions, refills and quantity 60 capsule 1  . predniSONE (DELTASONE) 10 MG tablet TAKE 2 TABLETS BY MOUTH DAILY FOR 2-3 DAYS AS NEEDED FOR GOUT 60 tablet 0  . tamsulosin (FLOMAX) 0.4 MG CAPS capsule TAKE 1 CAPSULE BY MOUTH EVERY DAY 90 capsule 3  . triamcinolone cream (KENALOG) 0.5 % Apply 1 application topically 2 (two) times daily. 30 g 3  . tadalafil (CIALIS) 5 MG tablet Take 1 tablet (5 mg total) by mouth daily. (Patient not taking: Reported on 02/17/2020) 30 tablet 11   No facility-administered medications prior to visit.    ROS: Review of Systems  Constitutional: Negative for appetite change, fatigue and unexpected weight change.  HENT: Negative for congestion, nosebleeds, sneezing, sore throat and trouble swallowing.   Eyes: Negative for itching and visual disturbance.  Respiratory: Negative for cough.   Cardiovascular: Negative for chest pain, palpitations and leg swelling.  Gastrointestinal: Negative for abdominal distention, blood in stool, diarrhea and nausea.  Genitourinary: Negative for frequency and hematuria.  Musculoskeletal: Positive for arthralgias. Negative for back pain, gait problem, joint swelling and neck  pain.  Skin: Negative for rash.  Neurological: Negative for dizziness, tremors, speech difficulty and weakness.  Psychiatric/Behavioral: Negative for agitation, dysphoric mood and sleep disturbance. The patient is not nervous/anxious.     Objective:  BP (!) 148/82 (BP Location: Left Arm, Patient Position: Sitting, Cuff Size: Normal)   Pulse 76   Temp 98.3 F (36.8 C) (Oral)   Ht 6' (1.829 m)   Wt 137 lb 4 oz (62.3 kg)   SpO2 98%   BMI 18.61 kg/m   BP Readings from Last 3 Encounters:  02/17/20 (!) 148/82  08/19/19 140/82  02/25/19 132/80    Wt Readings from Last 3 Encounters:  02/17/20 137 lb 4 oz (62.3 kg)  08/19/19 145 lb (65.8 kg)  02/25/19 146 lb (66.2 kg)    Physical Exam Constitutional:      General: He is not in acute distress.    Appearance: He is well-developed.     Comments: NAD  Eyes:     Conjunctiva/sclera: Conjunctivae normal.     Pupils: Pupils are equal, round, and reactive to light.  Neck:     Thyroid: No thyromegaly.     Vascular: No JVD.  Cardiovascular:     Rate and Rhythm: Normal rate and regular rhythm.     Heart sounds: Normal heart sounds. No murmur heard.  No friction rub. No gallop.   Pulmonary:     Effort: Pulmonary effort is normal. No respiratory distress.     Breath sounds: Normal breath sounds. No wheezing or  rales.  Chest:     Chest wall: No tenderness.  Abdominal:     General: Bowel sounds are normal. There is no distension.     Palpations: Abdomen is soft. There is no mass.     Tenderness: There is no abdominal tenderness. There is no guarding or rebound.  Musculoskeletal:        General: No tenderness. Normal range of motion.     Cervical back: Normal range of motion.  Lymphadenopathy:     Cervical: No cervical adenopathy.  Skin:    General: Skin is warm and dry.     Findings: No rash.  Neurological:     Mental Status: He is alert and oriented to person, place, and time.     Cranial Nerves: No cranial nerve deficit.      Motor: No abnormal muscle tone.     Coordination: Coordination normal.     Gait: Gait normal.     Deep Tendon Reflexes: Reflexes are normal and symmetric.  Psychiatric:        Behavior: Behavior normal.        Thought Content: Thought content normal.        Judgment: Judgment normal.    Thin  LS NT Str leg elev (-) B   Lab Results  Component Value Date   WBC 8.8 02/14/2020   HGB 14.5 02/14/2020   HCT 42.2 02/14/2020   PLT 281.0 02/14/2020   GLUCOSE 91 02/14/2020   CHOL 211 (H) 02/14/2020   TRIG 446.0 (H) 02/14/2020   HDL 54.10 02/14/2020   LDLDIRECT 106.0 02/14/2020   LDLCALC 102 (H) 04/27/2018   ALT 22 02/14/2020   AST 19 02/14/2020   NA 139 02/14/2020   K 3.9 02/14/2020   CL 103 02/14/2020   CREATININE 1.14 02/14/2020   BUN 17 02/14/2020   CO2 26 02/14/2020   TSH 3.75 02/14/2020   PSA 4.93 (H) 02/14/2020   HGBA1C 7.0 (H) 07/09/2017    DG Chest 2 View  Result Date: 04/28/2018 CLINICAL DATA:  Weight loss. Chronic obstructive pulmonary disease, unspecified. EXAM: CHEST - 2 VIEW COMPARISON:  02/08/2010 FINDINGS: Lungs are clear without airspace disease or pulmonary edema. Heart and mediastinum are within normal limits. Atherosclerotic calcifications at the aortic arch. Trachea is midline. No pleural effusions. Bone structures are unremarkable. IMPRESSION: No active cardiopulmonary disease. Aortic atherosclerosis. Electronically Signed   By: Richarda Overlie M.D.   On: 04/28/2018 08:24    Assessment & Plan:   There are no diagnoses linked to this encounter.   No orders of the defined types were placed in this encounter.    Follow-up: No follow-ups on file.  Sonda Primes, MD

## 2020-02-17 NOTE — Progress Notes (Addendum)
Subjective:   Brent Turner is a 79 y.o. male who presents for Medicare Annual/Subsequent preventive examination.  Review of Systems:  No Ros. Medicare Wellness Visit Cardiac Risk Factors include: advanced age (>82men, >80 women);dyslipidemia;family history of premature cardiovascular disease;hypertension;male gender;smoking/ tobacco exposure     Objective:    Vitals: BP (!) 148/82   Pulse 76   Temp 98.3 F (36.8 C)   Ht 6' (1.829 m)   Wt 137 lb 6.4 oz (62.3 kg)   SpO2 98%   BMI 18.63 kg/m   Body mass index is 18.63 kg/m.  Advanced Directives 02/17/2020 05/01/2016  Does Patient Have a Medical Advance Directive? Yes Yes  Type of Advance Directive Living will;Healthcare Power of Attorney -  Does patient want to make changes to medical advance directive? No - Patient declined -  Copy of Healthcare Power of Attorney in Chart? No - copy requested Yes    Tobacco Social History   Tobacco Use  Smoking Status Current Every Day Smoker   Packs/day: 1.00   Years: 50.00   Pack years: 50.00  Smokeless Tobacco Current User     Ready to quit: Not Answered Counseling given: Not Answered   Clinical Intake:  Pre-visit preparation completed: Yes  Pain : No/denies pain     BMI - recorded: 18.63 Nutritional Status: BMI <19  Underweight Nutritional Risks: Unintentional weight loss (due to no appetite) Diabetes: No  How often do you need to have someone help you when you read instructions, pamphlets, or other written materials from your doctor or pharmacy?: 1 - Never What is the last grade level you completed in school?: HS[employer]; college courses  Interpreter Needed?: No  Information entered by :: Chandon Lazcano N. Reymundo Poll, LPN  Past Medical History:  Diagnosis Date   ED (erectile dysfunction)    Gout    HTN (hypertension)    Vitamin D deficiency    History reviewed. No pertinent surgical history. Family History  Problem Relation Age of Onset   Stroke Mother 91   Cancer  Father 7       lung   Social History   Socioeconomic History   Marital status: Married    Spouse name: Not on file   Number of children: Not on file   Years of education: Not on file   Highest education level: Not on file  Occupational History   Occupation: hotel maintenance part time  Tobacco Use   Smoking status: Current Every Day Smoker    Packs/day: 1.00    Years: 50.00    Pack years: 50.00   Smokeless tobacco: Current User  Substance and Sexual Activity   Alcohol use: Yes    Comment: drinks wine; sometimes    Drug use: No   Sexual activity: Not Currently  Other Topics Concern   Not on file  Social History Narrative   Not on file   Social Determinants of Health   Financial Resource Strain:    Difficulty of Paying Living Expenses:   Food Insecurity:    Worried About Programme researcher, broadcasting/film/video in the Last Year:    Barista in the Last Year:   Transportation Needs:    Freight forwarder (Medical):    Lack of Transportation (Non-Medical):   Physical Activity:    Days of Exercise per Week:    Minutes of Exercise per Session:   Stress:    Feeling of Stress :   Social Connections:    Frequency of  Communication with Friends and Family:    Frequency of Social Gatherings with Friends and Family:    Attends Religious Services:    Active Member of Clubs or Organizations:    Attends Archivist Meetings:    Marital Status:     Outpatient Encounter Medications as of 02/17/2020  Medication Sig   allopurinol (ZYLOPRIM) 300 MG tablet Take 1 tablet (300 mg total) by mouth daily.   amLODipine (NORVASC) 5 MG tablet Take 1 tablet (5 mg total) by mouth daily.   Cholecalciferol 1000 UNITS tablet Take 1,000 Units by mouth daily.     MITIGARE 0.6 MG CAPS Please specify directions, refills and quantity   tadalafil (CIALIS) 5 MG tablet Take 1 tablet (5 mg total) by mouth daily. (Patient not taking: Reported on 02/17/2020)   No facility-administered encounter  medications on file as of 02/17/2020.    Activities of Daily Living In your present state of health, do you have any difficulty performing the following activities: 02/17/2020  Hearing? N  Vision? N  Difficulty concentrating or making decisions? N  Walking or climbing stairs? N  Dressing or bathing? N  Doing errands, shopping? N  Preparing Food and eating ? N  Using the Toilet? N  In the past six months, have you accidently leaked urine? N  Do you have problems with loss of bowel control? N  Managing your Medications? N  Managing your Finances? N  Housekeeping or managing your Housekeeping? N  Some recent data might be hidden    Patient Care Team: [name], Evie Lacks, MD as PCP - General Radionchenko, Isaias Cowman, MD as Referring Physician (Ophthalmology) Irene Shipper, MD as Consulting Physician (Gastroenterology)   Assessment:   This is a routine wellness examination for [place].  Exercise Activities and Dietary recommendations Current Exercise Habits: The patient does not participate in regular exercise at present, Exercise limited by: None identified  Goals      Quit smoking / using tobacco     Could try to stop smoking Think about the benefits of quitting; may increase appetite as food will have more taste  Smoking;  Educated to avoid secondary smoke Smoking cessation at [facility]: [phone]  Meds may help; chatix (Varenicline); Zyban (Bupropion SR); Nicotine Replacement (gum; lozenges; patches; etc.)  30 pack yr smoking hx: Educated regarding LDCT (low dose CT) ; To discuss with MD at next fup. Also educated on AAA screening for men 65-75 who have smoked  [place] quit line information give         Fall Risk Fall Risk  02/17/2020 02/17/2020 10/29/2018 07/09/2017 05/01/2016  Falls in the past year? 0 0 0 No No  Number falls in past yr: 0 0 - - -  Injury with Fall? 0 0 - - -  Risk for fall due to : No Fall Risks - - - -  Follow up Falls evaluation completed - Falls evaluation  completed - -   Is the patient's home free of loose throw rugs in walkways, pet beds, electrical cords, etc?   yes      Grab bars in the bathroom? yes      Handrails on the stairs?   yes      Adequate lighting?   yes  Timed Get Up and Go Performed: not indicated  Depression Screen PHQ 2/9 Scores 02/17/2020 10/29/2018 07/09/2017 05/01/2016  PHQ - 2 Score 0 0 0 0    Cognitive Function: not indicated; patient is cogitatively intact. MMSE - Mini  Mental State Exam 05/01/2016  Not completed: (No Data)        Immunization History  Administered Date(s) Administered   Fluad Quad(high Dose 65+) 08/19/2019   Influenza Split 07/13/2012   Influenza Whole 07/31/2010, 05/14/2011   Influenza, High Dose Seasonal PF 07/16/2013, 05/01/2016, 07/09/2017   Influenza,inj,Quad PF,6+ Mos 07/26/2014   Pneumococcal Polysaccharide-23 01/28/2007    Qualifies for Shingles Vaccine? Yes; patient declined  Screening Tests Health Maintenance  Topic Date Due   Hepatitis C Screening  Never done   COVID-19 Vaccine (1) Never done   TETANUS/TDAP  Never done   PNA vac Low Risk Adult (2 of 2 - PCV13) 01/28/2008   INFLUENZA VACCINE  04/02/2020   Cancer Screenings: Lung: Low Dose CT Chest recommended if Age 56-80 years, 30 pack-year currently smoking OR have quit w/in 15years. Patient does qualify. Colorectal: not a candidate for repeat due to age  Additional Screenings:  Hepatitis C Screening: never done      Plan:     Reviewed health maintenance screenings with patient today and relevant education, vaccines, and/or referrals were provided.    Continue doing brain stimulating activities (puzzles, reading, adult coloring books, staying active) to keep memory sharp.    Continue to eat heart healthy diet (full of fruits, vegetables, whole grains, lean protein, water--limit salt, fat, and sugar intake) and increase physical activity as tolerated.  I have personally reviewed and noted the following in the  patient's chart:   Medical and social history Use of alcohol, tobacco or illicit drugs  Current medications and supplements Functional ability and status Nutritional status Physical activity Advanced directives List of other physicians Hospitalizations, surgeries, and ER visits in previous 12 months Vitals Screenings to include cognitive, depression, and falls Referrals and appointments  In addition, I have reviewed and discussed with patient certain preventive protocols, quality metrics, and best practice recommendations. A written personalized care plan for preventive services as well as general preventive health recommendations were provided to patient.     Mickeal Needy, LPN  [id number]  Nurse Health Advisor  Medical screening examination/treatment/procedure(s) were performed by non-physician practitioner and as supervising physician I was immediately available for consultation/collaboration.  I agree with above. Jacinta Shoe, MD

## 2020-02-17 NOTE — Patient Instructions (Addendum)
For sciatica - take Prednisone 40 mg a day x 3 days, then 20 mg/d x 3 d, then 10 mg/d x 3 d, then stop    Sciatica  Sciatica is pain, weakness, tingling, or loss of feeling (numbness) along the sciatic nerve. The sciatic nerve starts in the lower back and goes down the back of each leg. Sciatica usually goes away on its own or with treatment. Sometimes, sciatica may come back (recur). What are the causes? This condition happens when the sciatic nerve is pinched or has pressure put on it. This may be the result of:  A disk in between the bones of the spine bulging out too far (herniated disk).  Changes in the spinal disks that occur with aging.  A condition that affects a muscle in the butt.  Extra bone growth near the sciatic nerve.  A break (fracture) of the area between your hip bones (pelvis).  Pregnancy.  Tumor. This is rare. What increases the risk? You are more likely to develop this condition if you:  Play sports that put pressure or stress on the spine.  Have poor strength and ease of movement (flexibility).  Have had a back injury in the past.  Have had back surgery.  Sit for long periods of time.  Do activities that involve bending or lifting over and over again.  Are very overweight (obese). What are the signs or symptoms? Symptoms can vary from mild to very bad. They may include:  Any of these problems in the lower back, leg, hip, or butt: ? Mild tingling, loss of feeling, or dull aches. ? Burning sensations. ? Sharp pains.  Loss of feeling in the back of the calf or the sole of the foot.  Leg weakness.  Very bad back pain that makes it hard to move. These symptoms may get worse when you cough, sneeze, or laugh. They may also get worse when you sit or stand for long periods of time. How is this treated? This condition often gets better without any treatment. However, treatment may include:  Changing or cutting back on physical activity when you have  pain.  Doing exercises and stretching.  Putting ice or heat on the affected area.  Medicines that help: ? To relieve pain and swelling. ? To relax your muscles.  Shots (injections) of medicines that help to relieve pain, irritation, and swelling.  Surgery. Follow these instructions at home: Medicines  Take over-the-counter and prescription medicines only as told by your doctor.  Ask your doctor if the medicine prescribed to you: ? Requires you to avoid driving or using heavy machinery. ? Can cause trouble pooping (constipation). You may need to take these steps to prevent or treat trouble pooping:  Drink enough fluids to keep your pee (urine) pale yellow.  Take over-the-counter or prescription medicines.  Eat foods that are high in fiber. These include beans, whole grains, and fresh fruits and vegetables.  Limit foods that are high in fat and sugar. These include fried or sweet foods. Managing pain      If told, put ice on the affected area. ? Put ice in a plastic bag. ? Place a towel between your skin and the bag. ? Leave the ice on for 20 minutes, 2-3 times a day.  If told, put heat on the affected area. Use the heat source that your doctor tells you to use, such as a moist heat pack or a heating pad. ? Place a towel between your skin  and the heat source. ? Leave the heat on for 20-30 minutes. ? Remove the heat if your skin turns bright red. This is very important if you are unable to feel pain, heat, or cold. You may have a greater risk of getting burned. Activity   Return to your normal activities as told by your doctor. Ask your doctor what activities are safe for you.  Avoid activities that make your symptoms worse.  Take short rests during the day. ? When you rest for a long time, do some physical activity or stretching between periods of rest. ? Avoid sitting for a long time without moving. Get up and move around at least one time each hour.  Exercise and  stretch regularly, as told by your doctor.  Do not lift anything that is heavier than 10 lb (4.5 kg) while you have symptoms of sciatica. ? Avoid lifting heavy things even when you do not have symptoms. ? Avoid lifting heavy things over and over.  When you lift objects, always lift in a way that is safe for your body. To do this, you should: ? Bend your knees. ? Keep the object close to your body. ? Avoid twisting. General instructions  Stay at a healthy weight.  Wear comfortable shoes that support your feet. Avoid wearing high heels.  Avoid sleeping on a mattress that is too soft or too hard. You might have less pain if you sleep on a mattress that is firm enough to support your back.  Keep all follow-up visits as told by your doctor. This is important. Contact a doctor if:  You have pain that: ? Wakes you up when you are sleeping. ? Gets worse when you lie down. ? Is worse than the pain you have had in the past. ? Lasts longer than 4 weeks.  You lose weight without trying. Get help right away if:  You cannot control when you pee (urinate) or poop (have a bowel movement).  You have weakness in any of these areas and it gets worse: ? Lower back. ? The area between your hip bones. ? Butt. ? Legs.  You have redness or swelling of your back.  You have a burning feeling when you pee. Summary  Sciatica is pain, weakness, tingling, or loss of feeling (numbness) along the sciatic nerve.  This condition happens when the sciatic nerve is pinched or has pressure put on it.  Sciatica can cause pain, tingling, or loss of feeling (numbness) in the lower back, legs, hips, and butt.  Treatment often includes rest, exercise, medicines, and putting ice or heat on the affected area. This information is not intended to replace advice given to you by your health care provider. Make sure you discuss any questions you have with your health care provider. Document Revised: 09/07/2018  Document Reviewed: 09/07/2018 Elsevier Patient Education  2020 [employer] wallet to another pocket

## 2020-03-08 ENCOUNTER — Encounter: Payer: Self-pay | Admitting: Internal Medicine

## 2020-03-08 ENCOUNTER — Ambulatory Visit (INDEPENDENT_AMBULATORY_CARE_PROVIDER_SITE_OTHER): Payer: Medicare Other | Admitting: Internal Medicine

## 2020-03-08 ENCOUNTER — Ambulatory Visit (INDEPENDENT_AMBULATORY_CARE_PROVIDER_SITE_OTHER): Payer: Medicare Other

## 2020-03-08 ENCOUNTER — Other Ambulatory Visit: Payer: Self-pay

## 2020-03-08 VITALS — BP 142/90 | HR 97 | Temp 98.3°F | Ht 72.0 in | Wt 137.0 lb

## 2020-03-08 DIAGNOSIS — G8929 Other chronic pain: Secondary | ICD-10-CM | POA: Diagnosis not present

## 2020-03-08 DIAGNOSIS — M5386 Other specified dorsopathies, lumbar region: Secondary | ICD-10-CM

## 2020-03-08 DIAGNOSIS — M5442 Lumbago with sciatica, left side: Secondary | ICD-10-CM | POA: Diagnosis not present

## 2020-03-08 IMAGING — DX DG LUMBAR SPINE COMPLETE 4+V
5 series · 5 of 5 positions shown · non-contrast
Comparison: None.

CLINICAL DATA: Lower back pain for 3 weeks.

EXAM:
LUMBAR SPINE - COMPLETE 4+ VIEW

[l-spine ap]
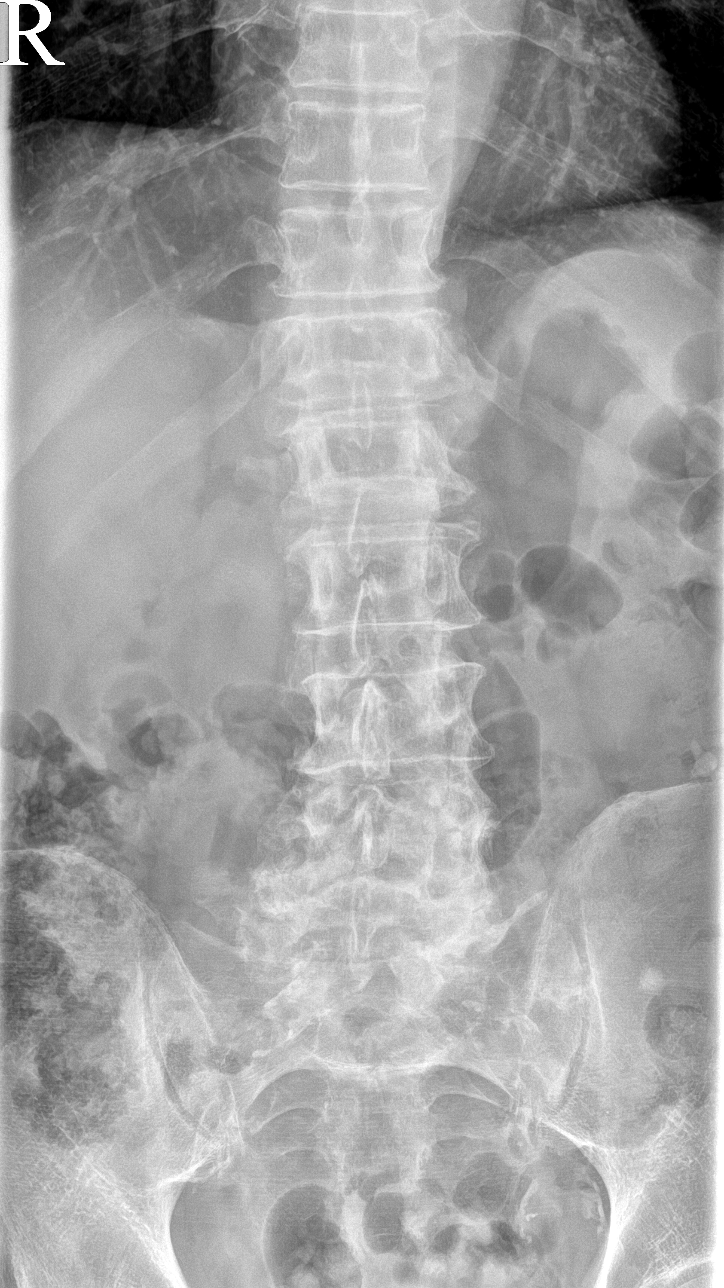

[l-spine obl (1 of 2)]
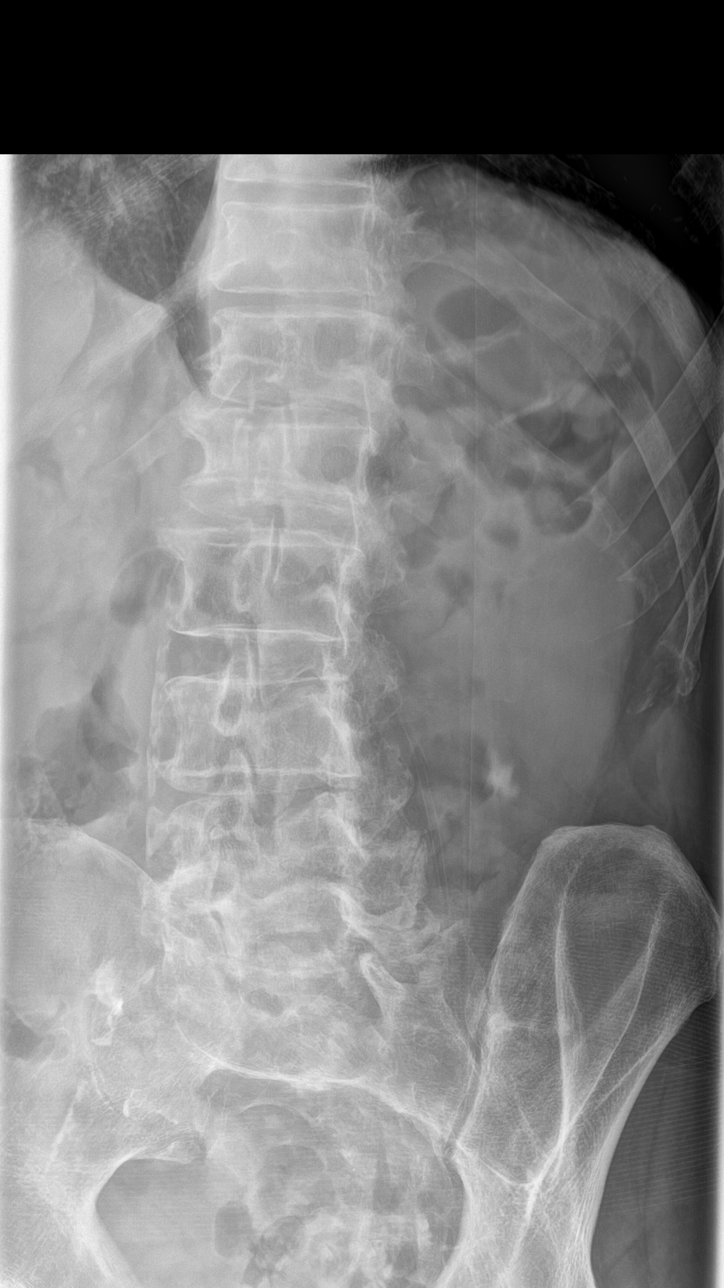

[l-spine obl (2 of 2)]
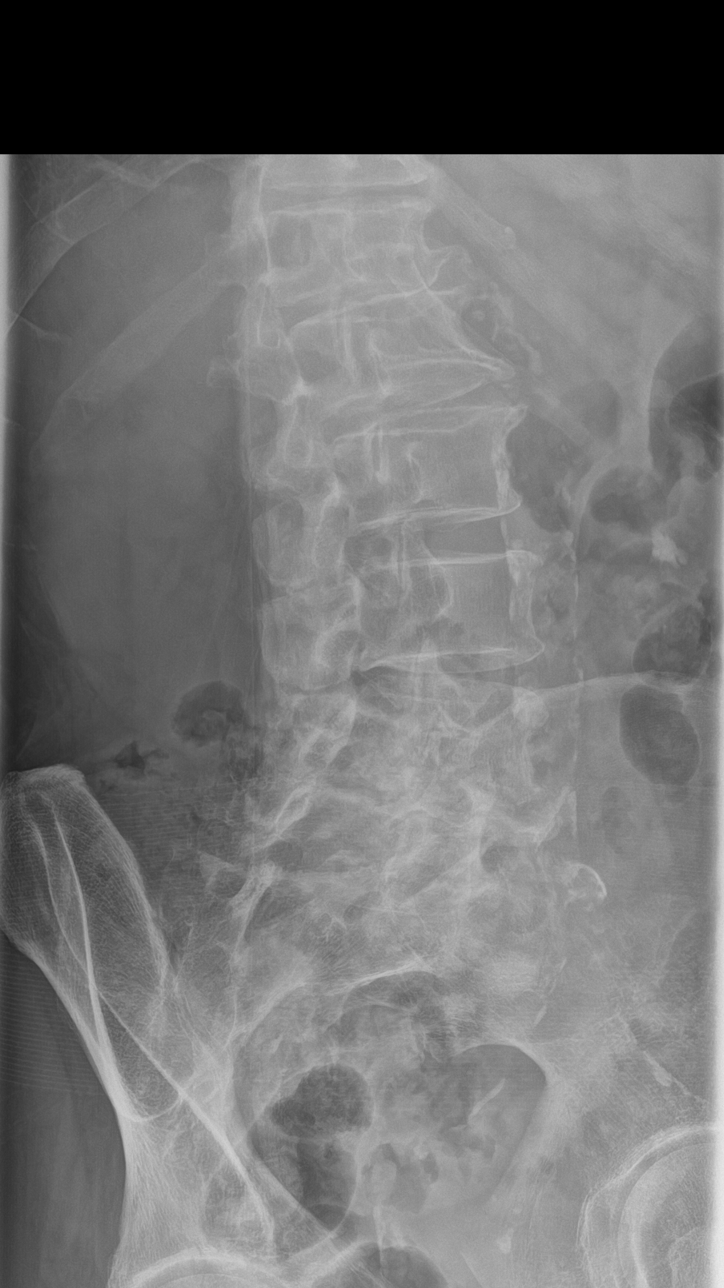

[l-spine lateral]
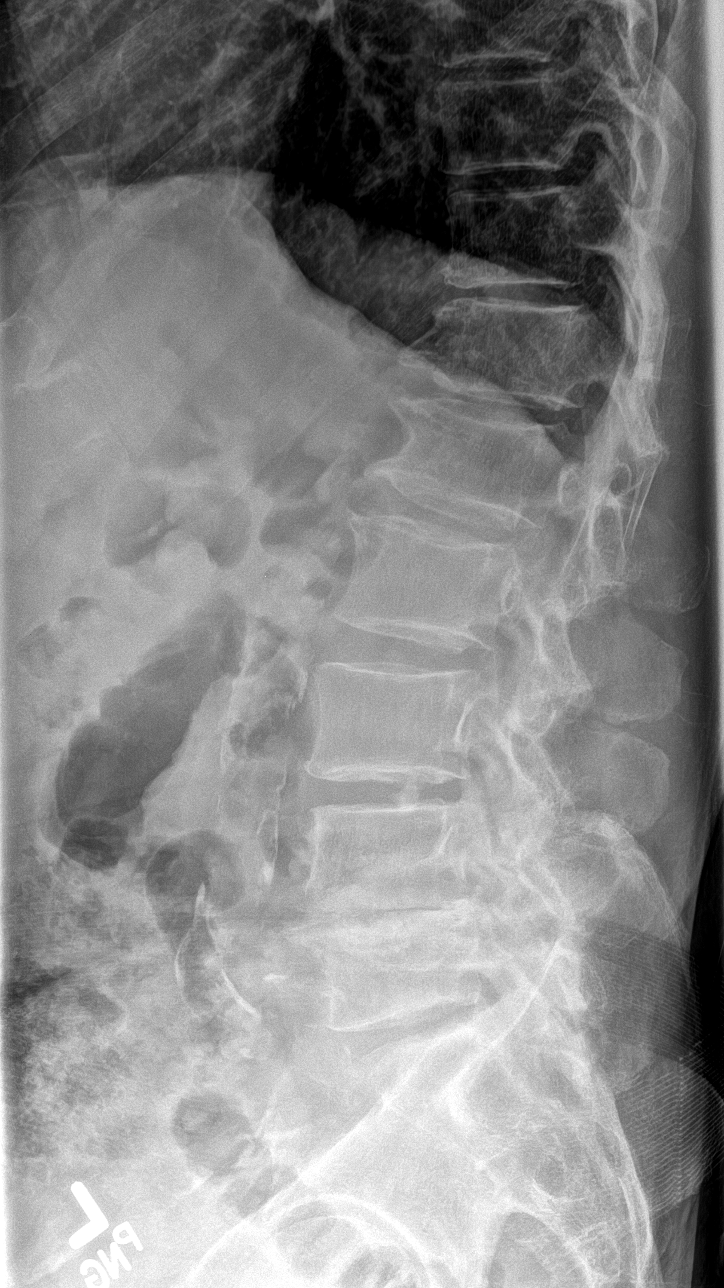

[l-spine spot]
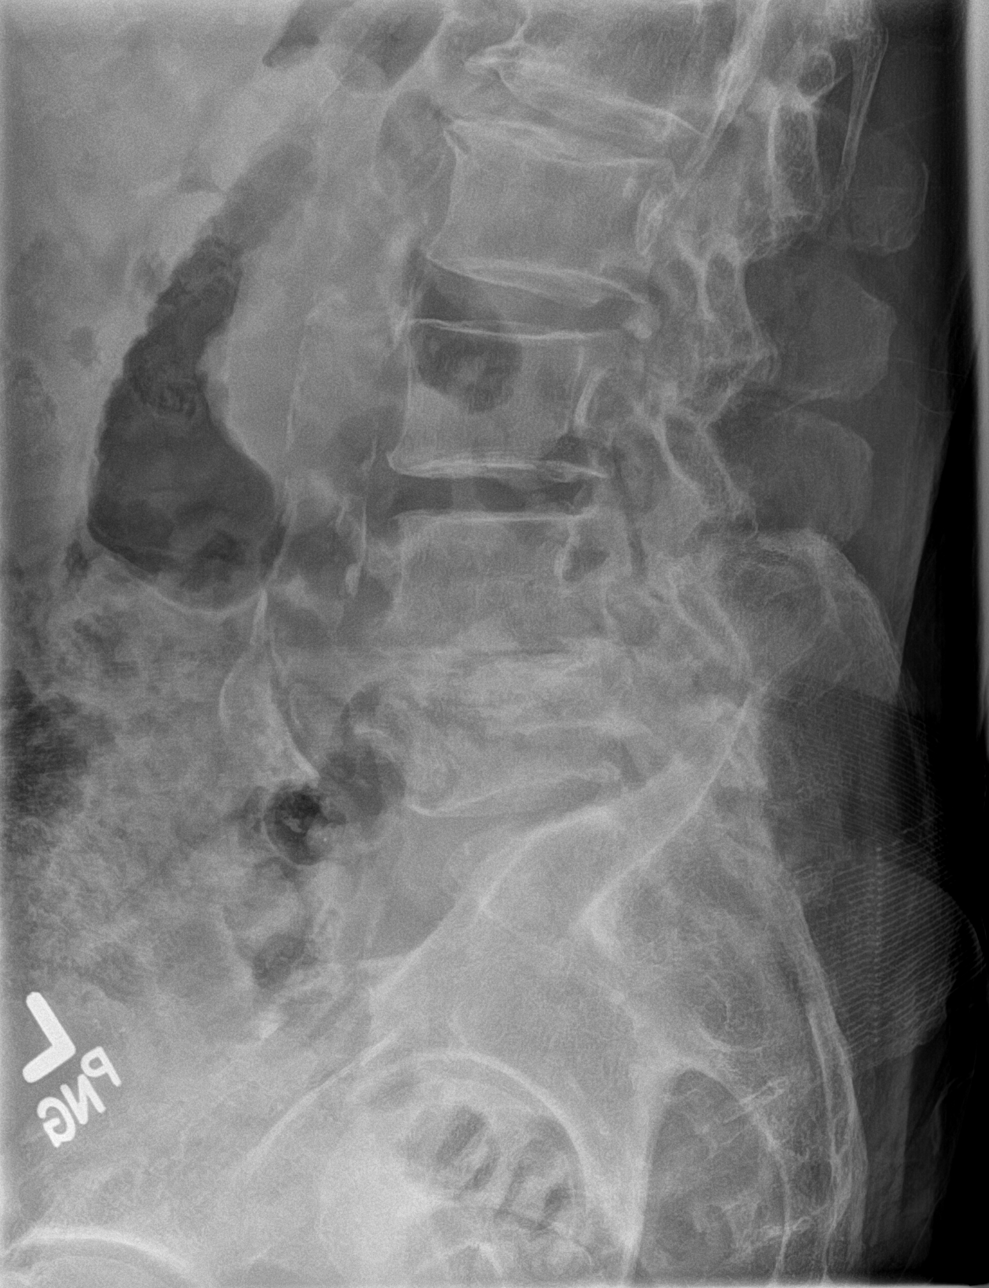

[5 of 5 positions shown; findings below may reference images not displayed]

FINDINGS: No fracture is noted. Minimal grade 1 anterolisthesis of L4-5 is
noted secondary to posterior facet joint hypertrophy. Severe
degenerative disc disease is noted at L4-5. Mild degenerative disc
disease is noted at L1-2 with anterior osteophyte formation.
IMPRESSION: Multilevel degenerative disc disease. No acute abnormality seen in
the lumbar spine.

Aortic Atherosclerosis ([IZ]-[IZ]).

## 2020-03-08 MED ORDER — NABUMETONE 500 MG PO TABS: 500.0000 mg | ORAL_TABLET | Freq: Two times a day (BID) | ORAL | 0 refills | Status: DC | PRN

## 2020-03-08 MED ORDER — GRALISE 600 MG PO TABS: 1.0000 | ORAL_TABLET | Freq: Every evening | ORAL | 0 refills | Status: DC

## 2020-03-08 MED ORDER — GRALISE 300 MG PO TABS: 1.0000 | ORAL_TABLET | Freq: Every evening | ORAL | 0 refills | Status: DC

## 2020-03-08 NOTE — Patient Instructions (Signed)
Acute Back Pain, Adult Acute back pain is sudden and usually short-lived. It is often caused by an injury to the muscles and tissues in the back. The injury may result from:  A muscle or ligament getting overstretched or torn (strained). Ligaments are tissues that connect bones to each other. Lifting something improperly can cause a back strain.  Wear and tear (degeneration) of the spinal disks. Spinal disks are circular tissue that provides cushioning between the bones of the spine (vertebrae).  Twisting motions, such as while playing sports or doing yard work.  A hit to the back.  Arthritis. You may have a physical exam, lab tests, and imaging tests to find the cause of your pain. Acute back pain usually goes away with rest and home care. Follow these instructions at home: Managing pain, stiffness, and swelling  Take over-the-counter and prescription medicines only as told by your health care provider.  Your health care provider may recommend applying ice during the first 24-48 hours after your pain starts. To do this: ? Put ice in a plastic bag. ? Place a towel between your skin and the bag. ? Leave the ice on for 20 minutes, 2-3 times a day.  If directed, apply heat to the affected area as often as told by your health care provider. Use the heat source that your health care provider recommends, such as a moist heat pack or a heating pad. ? Place a towel between your skin and the heat source. ? Leave the heat on for 20-30 minutes. ? Remove the heat if your skin turns bright red. This is especially important if you are unable to feel pain, heat, or cold. You have a greater risk of getting burned. Activity   Do not stay in bed. Staying in bed for more than 1-2 days can delay your recovery.  Sit up and stand up straight. Avoid leaning forward when you sit, or hunching over when you stand. ? If you work at a desk, sit close to it so you do not need to lean over. Keep your chin tucked  in. Keep your neck drawn back, and keep your elbows bent at a right angle. Your arms should look like the letter "L." ? Sit high and close to the steering wheel when you drive. Add lower back (lumbar) support to your car seat, if needed.  Take short walks on even surfaces as soon as you are able. Try to increase the length of time you walk each day.  Do not sit, drive, or stand in one place for more than 30 minutes at a time. Sitting or standing for long periods of time can put stress on your back.  Do not drive or use heavy machinery while taking prescription pain medicine.  Use proper lifting techniques. When you bend and lift, use positions that put less stress on your back: ? Bend your knees. ? Keep the load close to your body. ? Avoid twisting.  Exercise regularly as told by your health care provider. Exercising helps your back heal faster and helps prevent back injuries by keeping muscles strong and flexible.  Work with a physical therapist to make a safe exercise program, as recommended by your health care provider. Do any exercises as told by your physical therapist. Lifestyle  Maintain a healthy weight. Extra weight puts stress on your back and makes it difficult to have good posture.  Avoid activities or situations that make you feel anxious or stressed. Stress and anxiety increase muscle   tension and can make back pain worse. Learn ways to manage anxiety and stress, such as through exercise. General instructions  Sleep on a firm mattress in a comfortable position. Try lying on your side with your knees slightly bent. If you lie on your back, put a pillow under your knees.  Follow your treatment plan as told by your health care provider. This may include: ? Cognitive or behavioral therapy. ? Acupuncture or massage therapy. ? Meditation or yoga. Contact a health care provider if:  You have pain that is not relieved with rest or medicine.  You have increasing pain going down  into your legs or buttocks.  Your pain does not improve after 2 weeks.  You have pain at night.  You lose weight without trying.  You have a fever or chills. Get help right away if:  You develop new bowel or bladder control problems.  You have unusual weakness or numbness in your arms or legs.  You develop nausea or vomiting.  You develop abdominal pain.  You feel faint. Summary  Acute back pain is sudden and usually short-lived.  Use proper lifting techniques. When you bend and lift, use positions that put less stress on your back.  Take over-the-counter and prescription medicines and apply heat or ice as directed by your health care provider. This information is not intended to replace advice given to you by your health care provider. Make sure you discuss any questions you have with your health care provider. Document Revised: 12/08/2018 Document Reviewed: 04/02/2017 Elsevier Patient Education  2020 [employer]  

## 2020-03-08 NOTE — Progress Notes (Signed)
Subjective:  Patient ID: Brent Turner, male    DOB: 1941-04-01  Age: 79 y.o. MRN: [id number]  CC: Back Pain  This visit occurred during the SARS-CoV-2 public health emergency.  Safety protocols were in place, including screening questions prior to the visit, additional usage of staff PPE, and extensive cleaning of exam room while observing appropriate contact time as indicated for disinfecting solutions.   New to me  HPI Brent Turner presents for concerns about a 1 month history of low back pain.  He denies any recent trauma or injury.  He saw another provider a few weeks ago and was prescribed a course of prednisone which has not helped.  He describes an achy low back pain that increasingly radiates into his left lower extremity all the way down to the calf.  He has also had weakness and numbness in the left lower extremity.  The pain is worsened by walking.  He has also developed some discomfort over the last week while resting and sleeping.  He says the discomfort occasionally keeps him awake at night.  Outpatient Medications Prior to Visit  Medication Sig Dispense Refill  . allopurinol (ZYLOPRIM) 300 MG tablet Take 1 tablet (300 mg total) by mouth daily. 30 tablet 11  . amLODipine (NORVASC) 5 MG tablet Take 1 tablet (5 mg total) by mouth daily. 90 tablet 3  . Cholecalciferol 1000 UNITS tablet Take 1,000 Units by mouth daily.      Marland Kitchen HYDROcodone-acetaminophen (NORCO/VICODIN) 5-325 MG tablet Take 1-2 tablets by mouth 2 (two) times daily as needed for severe pain. 100 tablet 0  . MITIGARE 0.6 MG CAPS Please specify directions, refills and quantity 60 capsule 1  . predniSONE (DELTASONE) 10 MG tablet TAKE 2 TABLETS BY MOUTH DAILY FOR 2-3 DAYS AS NEEDED FOR GOUT 60 tablet 0  . tadalafil (CIALIS) 5 MG tablet Take 1 tablet (5 mg total) by mouth daily. 30 tablet 11  . tamsulosin (FLOMAX) 0.4 MG CAPS capsule Take 1 capsule (0.4 mg total) by mouth daily. 90 capsule 3  . triamcinolone cream (KENALOG)  0.5 % Apply 1 application topically 2 (two) times daily. 30 g 3   No facility-administered medications prior to visit.    ROS Review of Systems  Constitutional: Negative for appetite change, chills, diaphoresis, fatigue and unexpected weight change.  HENT: Negative.   Eyes: Negative for visual disturbance.  Respiratory: Negative for cough, chest tightness, shortness of breath and wheezing.   Cardiovascular: Negative for chest pain, palpitations and leg swelling.  Gastrointestinal: Negative for abdominal pain, constipation, diarrhea, nausea and vomiting.  Endocrine: Negative.   Genitourinary: Negative.  Negative for difficulty urinating.  Musculoskeletal: Positive for back pain. Negative for arthralgias, myalgias and neck pain.  Skin: Negative for color change and pallor.  Neurological: Positive for weakness and numbness. Negative for dizziness, light-headedness and headaches.  Hematological: Negative for adenopathy. Does not bruise/bleed easily.  Psychiatric/Behavioral: Negative.     Objective:  BP (!) 142/90 (BP Location: Right Arm, Patient Position: Sitting, Cuff Size: Normal)   Pulse 97   Temp 98.3 F (36.8 C) (Oral)   Ht 6' (1.829 m)   Wt 137 lb (62.1 kg)   SpO2 98%   BMI 18.58 kg/m   BP Readings from Last 3 Encounters:  03/08/20 (!) 142/90  02/17/20 (!) 148/82  02/17/20 (!) 148/82    Wt Readings from Last 3 Encounters:  03/08/20 137 lb (62.1 kg)  02/17/20 137 lb 6.4 oz (62.3 kg)  02/17/20  137 lb 4 oz (62.3 kg)    Physical Exam Vitals reviewed.  Constitutional:      Appearance: Normal appearance.  HENT:     Nose: Nose normal.     Mouth/Throat:     Mouth: Mucous membranes are moist.  Eyes:     General: No scleral icterus.    Conjunctiva/sclera: Conjunctivae normal.  Cardiovascular:     Rate and Rhythm: Normal rate and regular rhythm.     Heart sounds: No murmur heard.   Pulmonary:     Effort: Pulmonary effort is normal.     Breath sounds: No stridor.  No wheezing, rhonchi or rales.  Abdominal:     General: Abdomen is flat.     Palpations: There is no mass.     Tenderness: There is no abdominal tenderness.  Musculoskeletal:     Cervical back: Normal and neck supple.     Thoracic back: Normal.     Lumbar back: No swelling, edema, spasms or tenderness. Normal range of motion. Positive left straight leg raise test. Negative right straight leg raise test.     Right lower leg: No edema.     Left lower leg: No edema.  Lymphadenopathy:     Cervical: No cervical adenopathy.  Skin:    General: Skin is warm and dry.  Neurological:     General: No focal deficit present.     Mental Status: He is alert.     Cranial Nerves: Cranial nerves are intact.     Sensory: Sensation is intact.     Motor: Motor function is intact. No weakness or abnormal muscle tone.     Coordination: Coordination is intact.     Deep Tendon Reflexes: Reflexes normal.     Reflex Scores:      Tricep reflexes are 1+ on the right side and 1+ on the left side.      Bicep reflexes are 1+ on the right side and 1+ on the left side.      Brachioradialis reflexes are 0 on the right side and 0 on the left side.      Patellar reflexes are 1+ on the right side.      Achilles reflexes are 0 on the right side and 0 on the left side. Psychiatric:        Mood and Affect: Mood normal.        Behavior: Behavior normal.     Lab Results  Component Value Date   WBC 8.8 02/14/2020   HGB 14.5 02/14/2020   HCT 42.2 02/14/2020   PLT 281.0 02/14/2020   GLUCOSE 91 02/14/2020   CHOL 211 (H) 02/14/2020   TRIG 446.0 (H) 02/14/2020   HDL 54.10 02/14/2020   LDLDIRECT 106.0 02/14/2020   LDLCALC 102 (H) 04/27/2018   ALT 22 02/14/2020   AST 19 02/14/2020   NA 139 02/14/2020   K 3.9 02/14/2020   CL 103 02/14/2020   CREATININE 1.14 02/14/2020   BUN 17 02/14/2020   CO2 26 02/14/2020   TSH 3.75 02/14/2020   PSA 4.93 (H) 02/14/2020   HGBA1C 7.0 (H) 07/09/2017    DG Chest 2  View  Result Date: 04/28/2018 CLINICAL DATA:  Weight loss. Chronic obstructive pulmonary disease, unspecified. EXAM: CHEST - 2 VIEW COMPARISON:  02/08/2010 FINDINGS: Lungs are clear without airspace disease or pulmonary edema. Heart and mediastinum are within normal limits. Atherosclerotic calcifications at the aortic arch. Trachea is midline. No pleural effusions. Bone structures are unremarkable. IMPRESSION: No active  cardiopulmonary disease. Aortic atherosclerosis. Electronically Signed   By: Richarda Overlie M.D.   On: 04/28/2018 08:24   DG Lumbar Spine Complete  Result Date: 03/09/2020 CLINICAL DATA:  Lower back pain for 3 weeks. EXAM: LUMBAR SPINE - COMPLETE 4+ VIEW COMPARISON:  None. FINDINGS: No fracture is noted. Minimal grade 1 anterolisthesis of L4-5 is noted secondary to posterior facet joint hypertrophy. Severe degenerative disc disease is noted at L4-5. Mild degenerative disc disease is noted at L1-2 with anterior osteophyte formation. IMPRESSION: Multilevel degenerative disc disease. No acute abnormality seen in the lumbar spine. Aortic Atherosclerosis (ICD10-I70.0). Electronically Signed   By: Lupita Raider M.D.   On: 03/09/2020 09:26     Assessment & Plan:   Johntavius was seen today for back pain.  Diagnoses and all orders for this visit:  Sciatica of left side associated with disorder of lumbar spine- See below. -     DG Lumbar Spine Complete; Future -     Gabapentin, Once-Daily, (GRALISE) 300 MG TABS; Take 1 tablet by mouth every evening for 9 days. -     Gabapentin, Once-Daily, (GRALISE) 600 MG TABS; Take 1 tablet by mouth every evening for 15 days. -     nabumetone (RELAFEN) 500 MG tablet; Take 1 tablet (500 mg total) by mouth 2 (two) times daily as needed.  Chronic left-sided low back pain with left-sided sciatica- He has acute on chronic low back pain with radicular symptoms but on exam he is neurologically intact.  Plain films are remarkable only for degenerative changes.  I am  concerned he may have spinal stenosis.  Will try to control the pain with nabumetone and gabapentin.  If his symptoms do not improve soon then will consider doing an MRI of the lumbar spine. -     DG Lumbar Spine Complete; Future -     Gabapentin, Once-Daily, (GRALISE) 300 MG TABS; Take 1 tablet by mouth every evening for 9 days. -     Gabapentin, Once-Daily, (GRALISE) 600 MG TABS; Take 1 tablet by mouth every evening for 15 days. -     nabumetone (RELAFEN) 500 MG tablet; Take 1 tablet (500 mg total) by mouth 2 (two) times daily as needed.   I am having Venancio Poisson. Silvestri start on Gralise, Gralise, and nabumetone. I am also having him maintain his Cholecalciferol, tadalafil, Mitigare, allopurinol, amLODipine, HYDROcodone-acetaminophen, predniSONE, tamsulosin, and triamcinolone cream.  Meds ordered this encounter  Medications  . Gabapentin, Once-Daily, (GRALISE) 300 MG TABS    Sig: Take 1 tablet by mouth every evening for 9 days.    Dispense:  9 tablet    Refill:  0  . Gabapentin, Once-Daily, (GRALISE) 600 MG TABS    Sig: Take 1 tablet by mouth every evening for 15 days.    Dispense:  24 tablet    Refill:  0  . nabumetone (RELAFEN) 500 MG tablet    Sig: Take 1 tablet (500 mg total) by mouth 2 (two) times daily as needed.    Dispense:  180 tablet    Refill:  0     Follow-up: Return in about 6 weeks (around 04/19/2020).  Sanda Linger, MD

## 2020-03-13 ENCOUNTER — Other Ambulatory Visit: Payer: Self-pay | Admitting: Internal Medicine

## 2020-03-20 ENCOUNTER — Telehealth: Payer: Self-pay | Admitting: Internal Medicine

## 2020-03-20 DIAGNOSIS — G8929 Other chronic pain: Secondary | ICD-10-CM

## 2020-03-20 DIAGNOSIS — M5386 Other specified dorsopathies, lumbar region: Secondary | ICD-10-CM

## 2020-03-20 NOTE — Telephone Encounter (Signed)
New message:    Per pt's appt he states a MRI was discussed and pt is calling to let us know he is wanting to schedule the MRI now. Please advise.

## 2020-03-22 NOTE — Telephone Encounter (Signed)
Pt has been informed.

## 2020-03-22 NOTE — Telephone Encounter (Signed)
Ok RTC 1-2 wks w/results Thx

## 2020-03-28 ENCOUNTER — Other Ambulatory Visit (INDEPENDENT_AMBULATORY_CARE_PROVIDER_SITE_OTHER): Payer: Medicare Other

## 2020-03-28 ENCOUNTER — Telehealth: Payer: Self-pay

## 2020-03-28 DIAGNOSIS — I10 Essential (primary) hypertension: Secondary | ICD-10-CM

## 2020-03-28 DIAGNOSIS — M1000 Idiopathic gout, unspecified site: Secondary | ICD-10-CM

## 2020-03-28 LAB — BASIC METABOLIC PANEL
BUN: 17 mg/dL (ref 6–23)
CO2: 26 mEq/L (ref 19–32)
Calcium: 9.1 mg/dL (ref 8.4–10.5)
Chloride: 103 mEq/L (ref 96–112)
Creatinine, Ser: 0.98 mg/dL (ref 0.40–1.50)
GFR: 73.84 mL/min (ref >60.00)
Glucose, Bld: 116 mg/dL — ABNORMAL HIGH (ref 70–99)
Potassium: 3.5 mEq/L (ref 3.5–5.1)
Sodium: 137 mEq/L (ref 135–145)

## 2020-03-28 NOTE — Telephone Encounter (Signed)
Pt aware of orders and will have them done at the [facility] lab today

## 2020-03-28 NOTE — Telephone Encounter (Signed)
BMET orders placed for pt

## 2020-03-28 NOTE — Addendum Note (Signed)
Addended by: Miguel Aschoff on: 03/28/2020 10:41 AM   Modules accepted: Orders

## 2020-03-30 ENCOUNTER — Ambulatory Visit (HOSPITAL_COMMUNITY)
Admission: RE | Admit: 2020-03-30 | Discharge: 2020-03-30 | Disposition: A | Discharge status: Home / Self Care | Payer: Medicare Other | Source: Ambulatory Visit | Attending: Internal Medicine | Admitting: Internal Medicine

## 2020-03-30 ENCOUNTER — Other Ambulatory Visit: Payer: Self-pay | Admitting: Internal Medicine

## 2020-03-30 ENCOUNTER — Other Ambulatory Visit: Payer: Self-pay

## 2020-03-30 DIAGNOSIS — M5442 Lumbago with sciatica, left side: Secondary | ICD-10-CM | POA: Diagnosis not present

## 2020-03-30 DIAGNOSIS — G8929 Other chronic pain: Secondary | ICD-10-CM | POA: Diagnosis not present

## 2020-03-30 DIAGNOSIS — M545 Low back pain: Secondary | ICD-10-CM | POA: Diagnosis not present

## 2020-03-30 DIAGNOSIS — M5386 Other specified dorsopathies, lumbar region: Secondary | ICD-10-CM

## 2020-03-30 IMAGING — MR MR LUMBAR SPINE W/O CM
4 series · 38 of 48 positions shown · non-contrast
Comparison: Lumbar spine radiographs [DATE]

CLINICAL DATA: Low back pain for 1 month. Severe refractory
left-sided sciatica.

EXAM:
MRI LUMBAR SPINE WITHOUT CONTRAST
TECHNIQUE: Multiplanar, multisequence MR imaging of the lumbar spine was
performed. No intravenous contrast was administered.

[Series 9: T1 · sagittal · 4.0mm · 0.75mm/px · 8 of 17 slices shown]
[im 1/17]
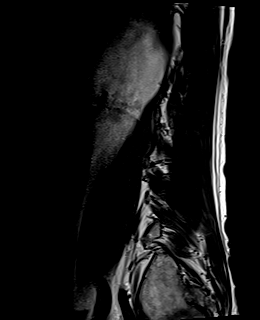
[im 2/17]
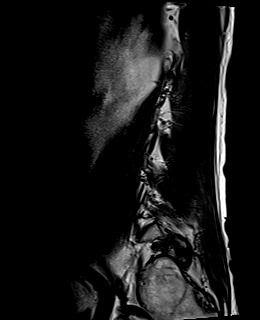
[im 6/17]
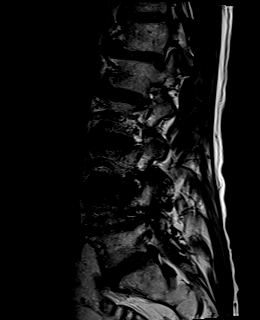
[im 8/17]
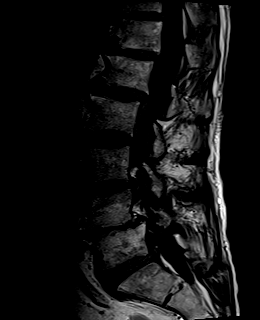
[im 9/17]
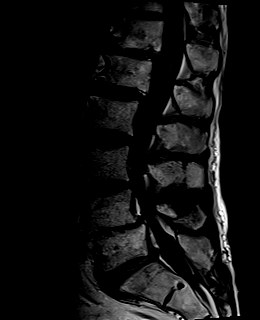
[im 11/17]
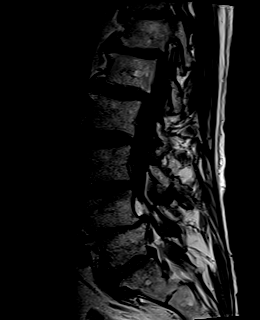
[im 15/17]
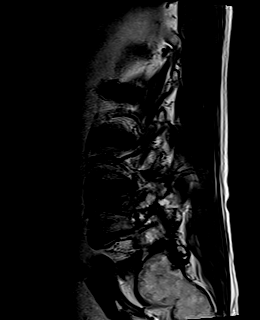
[im 17/17]
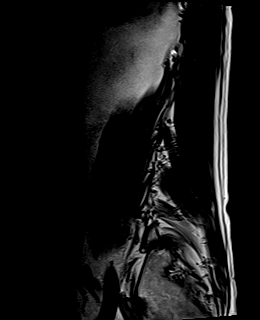

[Series 10: STIR · sagittal · 4.0mm · 0.47mm/px · 9 of 17 slices shown]
[im 1/17]
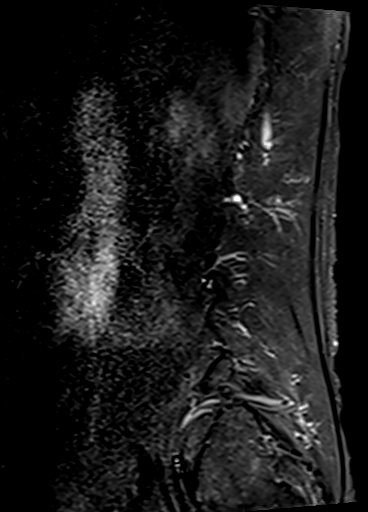
[im 3/17]
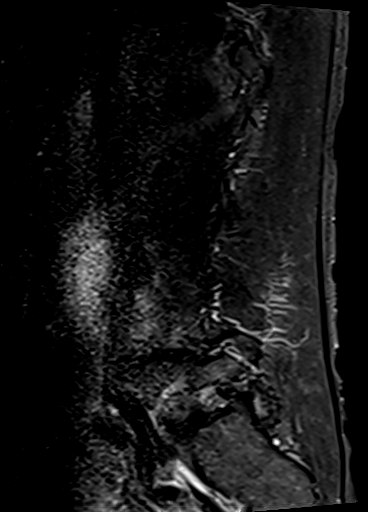
[im 5/17]
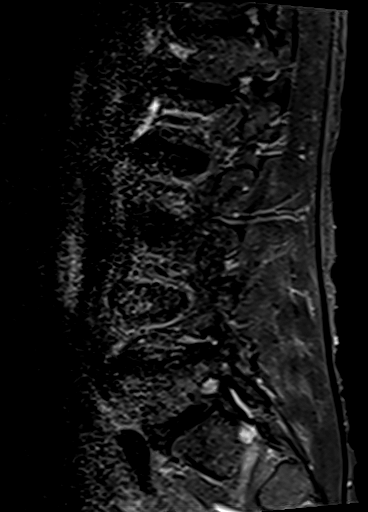
[im 7/17]
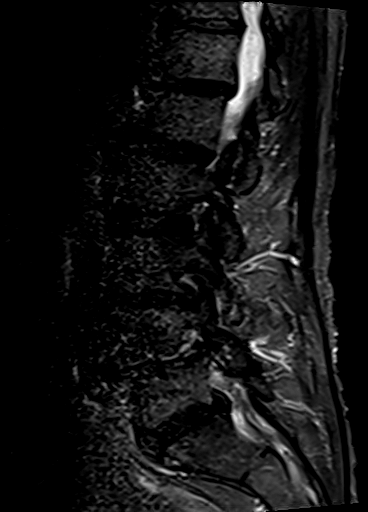
[im 9/17]
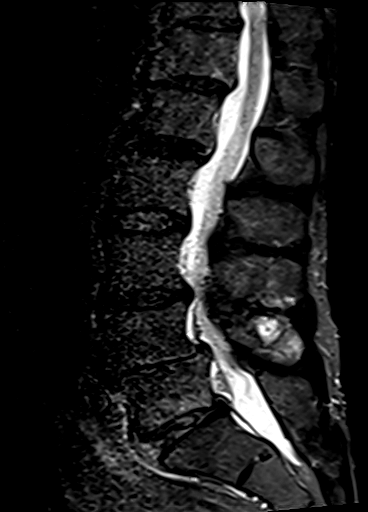
[im 11/17]
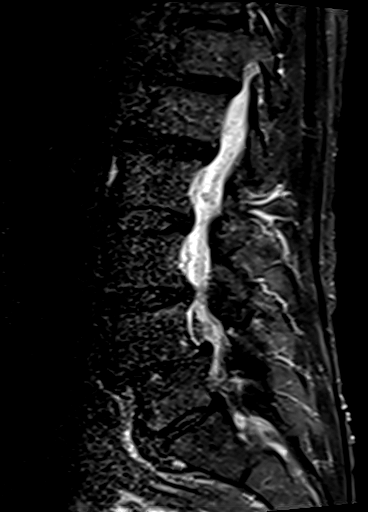
[im 13/17]
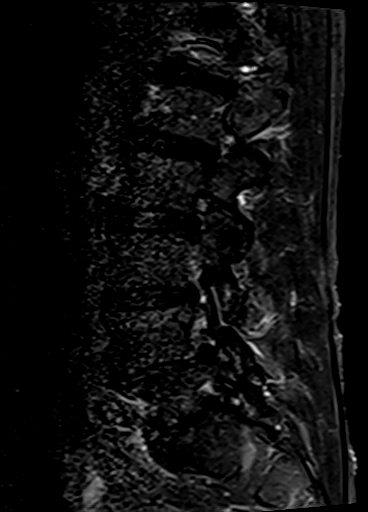
[im 15/17]
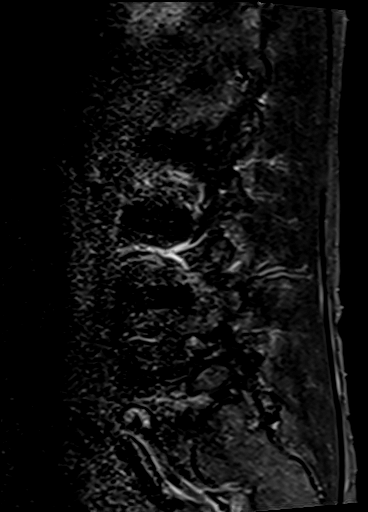
[im 17/17]
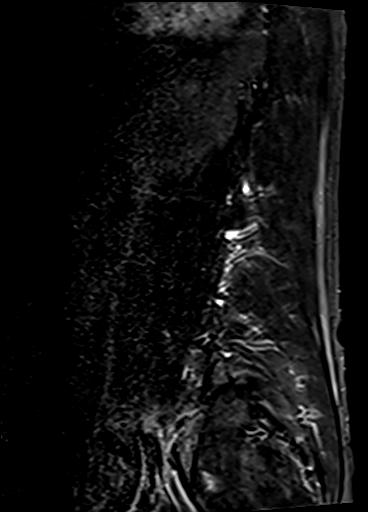

[Series 11: T2 · sagittal · 4.0mm · 0.75mm/px · 9 of 17 slices shown (1 of 2)]
[im 1/17]
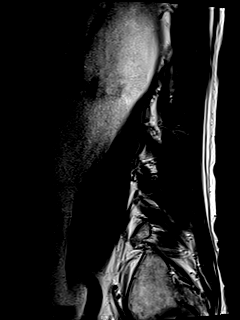
[im 3/17]
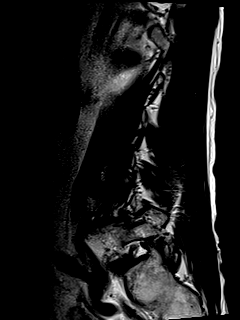
[im 5/17]
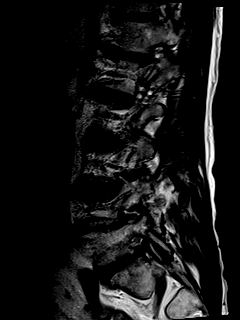
[im 7/17]
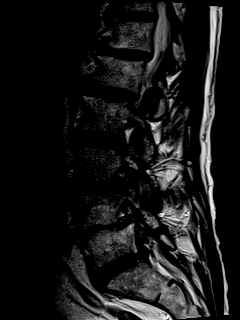
[im 9/17]
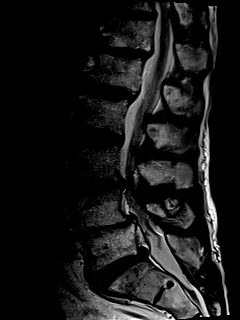
[im 11/17]
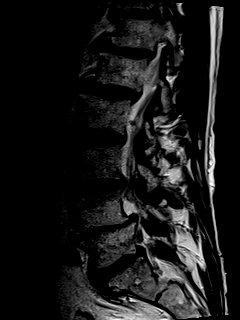
[im 13/17]
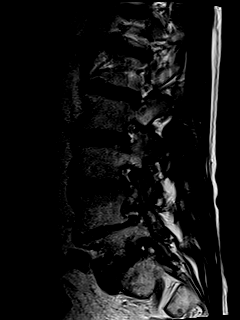
[im 15/17]
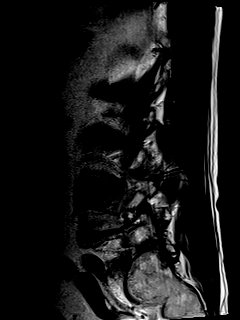
[im 17/17]
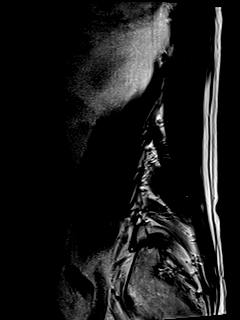

[Series 12: T2 · axial · 4.0mm · 0.62mm/px · z∈[-236,-38]mm · 12 of 38 slices shown (2 of 2)]
[im 1/38]
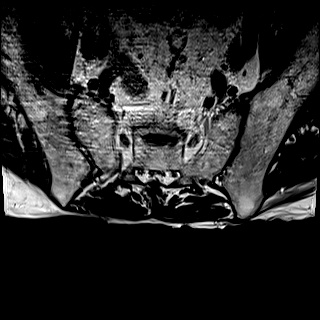
[im 2/38]
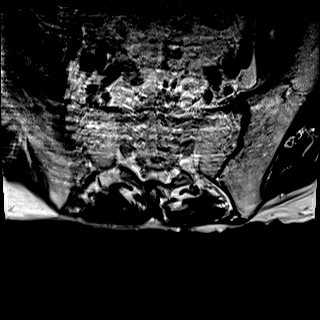
[im 4/38]
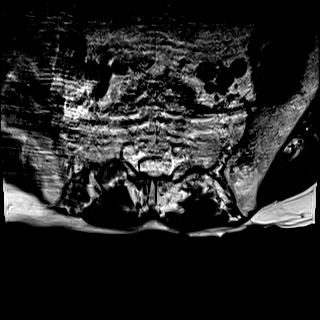
[im 6/38]
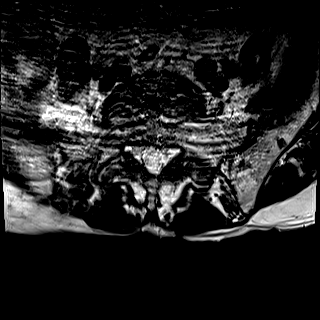
[im 8/38]
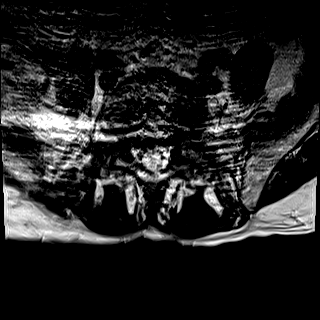
[im 12/38]
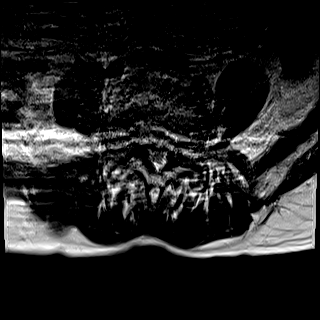
[im 16/38]
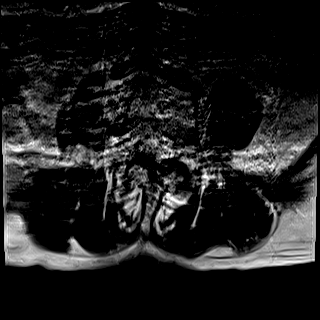
[im 20/38]
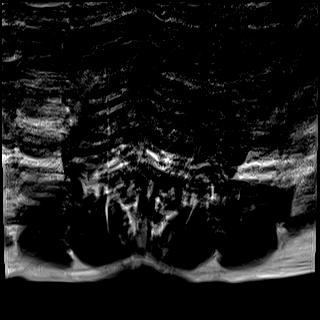
[im 22/38]
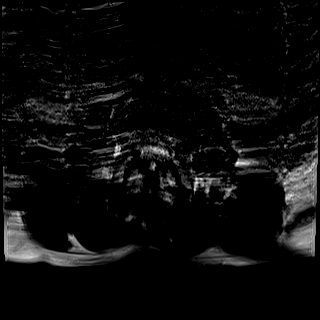
[im 26/38]
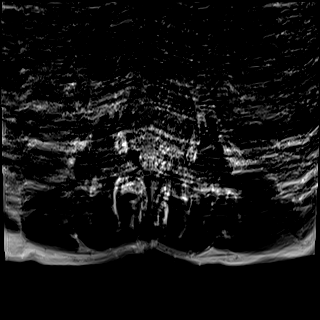
[im 32/38]
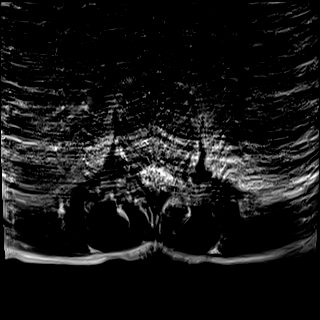
[im 36/38]
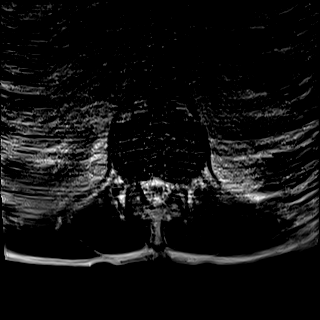

[38 of 48 positions shown; findings below may reference images not displayed]

FINDINGS: The patient terminated the examination prior to completion due to
pain. Axial T1 weighted imaging was not obtained. Sagittal sequences
are mildly motion degraded while the axial T2 sequence is severely
degraded.

Segmentation: Standard.

Alignment: Mild lumbar levoscoliosis. Grade 1 retrolisthesis of T12
on L1 and L1 on L2 and grade 1 anterolisthesis of L4 on L5.

Vertebrae: No fracture or suspicious osseous lesion. Mild
degenerative endplate edema at L5-S1. Chronic degenerative endplate
changes at L4-5.

Conus medullaris and cauda equina: Conus extends to the L1 level.
Conus and cauda equina appear normal.

Paraspinal and other soft tissues: Grossly unremarkable.

Disc levels:

Disc desiccation throughout the lumbar and included lower thoracic
spine. Severe disc space narrowing at L4-5 with milder narrowing at
L3-4 and L5-S1.

T12-L1: Retrolisthesis with minimal bulging of uncovered disc
without compressive stenosis.

L1-2: Retrolisthesis with minimal bulging of uncovered disc results
in mild left lateral recess stenosis and borderline to mild
bilateral neural foraminal stenosis without spinal stenosis.

L2-3: Mild disc bulging and mild facet and ligamentum flavum
hypertrophy result in borderline spinal stenosis, mild bilateral
lateral recess stenosis, and mild left neural foraminal stenosis.

L3-4: Disc bulging, prominent dorsal epidural fat, and moderate
facet and ligamentum flavum hypertrophy result in moderate spinal
stenosis and moderate to severe right and mild left neural foraminal
stenosis.

L4-5: Anterolisthesis with mild bulging of uncovered disc,
ligamentum flavum hypertrophy, and severe facet arthrosis result in
moderate to severe spinal stenosis, bilateral lateral recess
stenosis, and moderate bilateral neural foraminal stenosis.
Potential bilateral L5 nerve root impingement in the lateral
recesses.

L5-S1: A moderately large right paracentral and right subarticular
disc extrusion with superior migration to the L5 pedicle level
results in severe right lateral recess stenosis and presumed right
L5 nerve root impingement. Disc bulging, ligamentum flavum
hypertrophy, and moderate to severe facet hypertrophy result in mild
left lateral recess stenosis and moderate right and severe left
neural foraminal stenosis with left L5 nerve root compression in the
neural foramen. No significant generalized spinal stenosis.
IMPRESSION: 1. Motion degraded, incomplete examination.
2. Advanced disc and facet degeneration in the lower lumbar spine.
3. Severe left neural foraminal stenosis at L5-S1 with L5 nerve root
compression.
4. Moderately large right paracentral/subarticular disc extrusion at
L5-S1 with severe right lateral recess stenosis and right L5 nerve
root impingement.
5. Moderate to severe spinal stenosis and moderate bilateral neural
foraminal stenosis at L4-5.
6. Moderate spinal stenosis and moderate to severe right neural
foraminal stenosis at L3-4.

## 2020-03-30 MED ORDER — GADOBUTROL 1 MMOL/ML IV SOLN: 6.0000 mL | Freq: Once | INTRAVENOUS | Status: DC | PRN

## 2020-03-30 NOTE — Progress Notes (Signed)
Failed attempt at MRI. Patient terminated the exam due to pain and refused to continue. Limited MRI of the lumbar spine without gadolinium completed.

## 2020-04-02 ENCOUNTER — Other Ambulatory Visit: Payer: Self-pay | Admitting: Internal Medicine

## 2020-04-02 DIAGNOSIS — M5442 Lumbago with sciatica, left side: Secondary | ICD-10-CM

## 2020-04-02 DIAGNOSIS — M5430 Sciatica, unspecified side: Secondary | ICD-10-CM

## 2020-04-07 DIAGNOSIS — Z681 Body mass index (BMI) 19 or less, adult: Secondary | ICD-10-CM | POA: Diagnosis not present

## 2020-04-07 DIAGNOSIS — M48061 Spinal stenosis, lumbar region without neurogenic claudication: Secondary | ICD-10-CM | POA: Diagnosis not present

## 2020-04-07 DIAGNOSIS — I10 Essential (primary) hypertension: Secondary | ICD-10-CM | POA: Diagnosis not present

## 2020-04-10 ENCOUNTER — Other Ambulatory Visit: Payer: Self-pay | Admitting: Neurosurgery

## 2020-04-18 NOTE — H&P (Signed)
Patient ID:   [id number] Patient: Brent Turner  Date of Birth: 14-Mar-1941 Visit Type: Office Visit   Date: 04/07/2020 03:15 PM Provider: Danae Orleans. Venetia Maxon MD   This 79 year old male presents for back pain.  HISTORY OF PRESENT ILLNESS: 1.  back pain  Brent Turner is a 79 year old retired male who presents for neurosurgical evaluation for left-sided low back pain that radiates down his buttock into his posterior thigh and into his lateral calf.  He reports that the symptoms have been going on for about 1 month and has progressively worsened since onset.  He currently reports the pain to be 2 to 3/10 any 10-10 at its worst.  He stated that he has very little numbness and tingling but does have slight numbness and tingling in his left foot.  He denies any weakness.  Stated that the pain is most severe when he is lying down and walking.  The pain gets much better after sitting.  He stated he was unable to finish his MRI due to significant pain.  He is tried Nabumetone for his pain which has offered no relief.  Past medical history:  HTN, gout Medications:  Gabapentin, tamsulosin, allopurinol, amlodipine, prednisone       PAST MEDICAL/SURGICAL HISTORY:   (Detailed)      Family History:  (Detailed)   Social History:  (Detailed) Tobacco use reviewed. Preferred language is [place].   Tobacco use status: Heavy cigarette smoker (20-39 cigs/day). Smoking status: Heavy tobacco smoker.  SMOKING STATUS Type Smoking Status Usage Per Day Years Used Total Pack Years Cigarette Heavy tobacco smoker 1 Packs        MEDICATIONS: (added, continued or stopped this visit) Started Medication Directions Instruction Stopped  allopurinol 300 mg tablet take 1 tablet by oral route  every day    amlodipine 5 mg tablet take 1 tablet by oral route  every day    gabapentin take 1 capsule by oral route  every day    nabumetone take 1 tablet by oral route 2 times every  day    prednisone     tamsulosin 0.4 mg capsule take 1 capsule by oral route  every day 1/2 hour following the same meal each day   04/07/2020 tramadol 50 mg tablet take 1 tablet by oral route  every 6 hours as needed for pain    triamcinolone acetonide 0.5 % topical cream       ALLERGIES: Ingredient Reaction Medication Name Comment NO KNOWN ALLERGIES    No known allergies. Reviewed, updated.    PHYSICAL EXAM:  Vitals Date Temp F BP Pulse Ht In Wt Lb BMI BSA Pain Score 04/07/2020  142/80 99 72 138.6 18.8  0/10   PHYSICAL EXAM Details General Level of Distress: no acute distress Overall Appearance: normal    Cardiovascular Cardiac: regular rate and rhythm without murmur  Respiratory Lungs: clear to auscultation  Neurological Recent and Remote Memory: normal Attention Span and Concentration:   normal Language: normal Fund of Knowledge: normal  Right Left Sensation: normal normal Upper Extremity Coordination: normal normal  Lower Extremity Coordination: normal normal  Musculoskeletal Gait and Station: normal  Right Left Upper Extremity Muscle Strength: normal normal Lower Extremity Muscle Strength: normal normal Upper Extremity Muscle Tone:  normal normal Lower Extremity Muscle Tone: normal normal   Motor Strength Upper and lower extremity motor strength was tested in the clinically pertinent muscles. Any abnormal findings will be noted below.   Right Left EHL:  4-/5  Deep Tendon Reflexes  Right Left Biceps: normal normal Triceps: normal normal Brachioradialis: normal normal Patellar: normal normal Achilles: normal normal  Sensory Sensation was tested at L1 to S1. Any abnormal findings will be noted below.  Right Left L5:  decreased   Cranial Nerves II. Optic Nerve/Visual Fields: normal III. Oculomotor: normal IV. Trochlear: normal V. Trigeminal: normal VI. Abducens: normal VII.  Facial: normal VIII. Acoustic/Vestibular: normal IX. Glossopharyngeal: normal X. Vagus: normal XI. Spinal Accessory: normal XII. Hypoglossal: normal  Motor and other Tests Lhermittes: negative Rhomberg: negative    Right Left Hoffman's: normal normal Clonus: normal normal Babinski: normal normal SLR: negative negative Patrick's Pearlean Brownie): negative negative Toe Walk: normal normal Toe Lift: normal normal Heel Walk: normal normal SI Joint: nontender nontender   Additional Findings:  Left hip abductor 4-/5, partial footdrop   DIAGNOSTIC RESULTS:  Noncontrast lumbar MRI and four view lumbar radiographs reviewed with patient in the office today. The patient has significant degenerative changes and facet arthropathy throughout his lumbar spine. L4 on L5 spondylolisthesis with bilateral foraminal stenosis and moderate to severe spinal stenosis.  At L5-S1 he has a right central canal herniation and a left lateral disc herniation causing significant left-sided foraminal stenosis and compression of the exiting nerve root and moderate spinal stenosis.    IMPRESSION:  The patient has severe left-sided low back pain with the left L5 radiculopathy in significant weakness.  Left EHL 4-/5 and left hip abductor 4-/5, and partial footdrop.  His imaging is remarkable for a left lateral disc herniation at L5-S1 causing severe foraminal stenosis and compression of the exiting nerve root.  L4-5 has severe disc degeneration with severe spinal stenosis and bilateral foraminal stenosis.  PLAN: I have recommended proceeding with decompression and fusion of L4-5 and L5-S1 with a PLIF at L5-S1 and instrumentation. Risks and benefits were discussed in detail with the patient and he wishes to proceed with surgery. Tramadol 50 mg q.i.d. P.r.n.[employer] prescription for LSO brace given to the patient while in the office today. He will follow-up 3 weeks after discharge from the hospital with A/P lateral  radiographs.  Orders: Diagnostic Procedures: Assessment Procedure M48.061 Lumbar Spine- AP/Lat Instruction(s)/Education: Assessment Instruction I10 Lifestyle education Z68.1 Dietary management education, guidance, and counseling Miscellaneous: Assessment  M48.061 LSO Brace  Completed Orders (this encounter) Order Details Reason Side Interpretation Result Initial Treatment Date Region Lifestyle education Patient will follow up with Primary Care Physician.       Dietary management education, guidance, and counseling Encouraged patient to eat well balanced diet.        Assessment/Plan  # Detail Type Description  1. 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  Plan Orders LSO Brace.     2. 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     3. Assessment Spondylosis (M47.9).     4. 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     5. Assessment Body mass index (BMI) 19.9 or less, adult (Z68.1).  Plan Orders Today's instructions / counseling include(s) Dietary management education, guidance, and counseling. Clinical information/comments: Encouraged patient to eat well balanced diet.     6. Assessment Essential (primary) hypertension (I10).       Pain Management Plan Pain Scale: 0/10. Method: Numeric Pain Intensity Scale.     MEDICATIONS PRESCRIBED TODAY    Rx Quantity Refills TRAMADOL HCL 50 mg  60 0           Provider:  Danae Orleans. Venetia Maxon MD  04/09/2020 02:43 PM    Dictation edited by: Danae Orleans. [facility]    CC Providers: [facility]  [address] [place], [place] [address]-               Electronically signed by Danae Orleans Venetia Maxon MD on 04/09/2020 02:44 PM

## 2020-04-19 ENCOUNTER — Other Ambulatory Visit: Payer: Self-pay

## 2020-04-19 ENCOUNTER — Other Ambulatory Visit (HOSPITAL_COMMUNITY)
Admission: RE | Admit: 2020-04-19 | Discharge: 2020-04-19 | Disposition: A | Discharge status: Home / Self Care | Payer: Medicare Other | Source: Ambulatory Visit | Attending: Neurosurgery | Admitting: Neurosurgery

## 2020-04-19 ENCOUNTER — Encounter (HOSPITAL_COMMUNITY)
Admission: RE | Admit: 2020-04-19 | Discharge: 2020-04-19 | Disposition: A | Discharge status: Home / Self Care | Payer: Medicare Other | Source: Ambulatory Visit | Attending: Neurosurgery | Admitting: Neurosurgery

## 2020-04-19 ENCOUNTER — Encounter (HOSPITAL_COMMUNITY): Payer: Self-pay

## 2020-04-19 DIAGNOSIS — Z01812 Encounter for preprocedural laboratory examination: Secondary | ICD-10-CM | POA: Insufficient documentation

## 2020-04-19 DIAGNOSIS — Z20822 Contact with and (suspected) exposure to covid-19: Secondary | ICD-10-CM | POA: Insufficient documentation

## 2020-04-19 LAB — BASIC METABOLIC PANEL
Anion gap: 9 (ref 5–15)
BUN: 9 mg/dL (ref 8–23)
CO2: 26 mmol/L (ref 22–32)
Calcium: 9.6 mg/dL (ref 8.9–10.3)
Chloride: 104 mmol/L (ref 98–111)
Creatinine, Ser: 0.84 mg/dL (ref 0.61–1.24)
GFR calc Af Amer: >60 mL/min (ref >60)
GFR calc non Af Amer: >60 mL/min (ref >60)
Glucose, Bld: 137 mg/dL — ABNORMAL HIGH (ref 70–99)
Potassium: 3.7 mmol/L (ref 3.5–5.1)
Sodium: 139 mmol/L (ref 135–145)

## 2020-04-19 LAB — CBC
HCT: 43.6 % (ref 39.0–52.0)
Hemoglobin: 14.9 g/dL (ref 13.0–17.0)
MCH: 34.4 pg — ABNORMAL HIGH (ref 26.0–34.0)
MCHC: 34.2 g/dL (ref 30.0–36.0)
MCV: 100.7 fL — ABNORMAL HIGH (ref 80.0–100.0)
Platelets: 291 10*3/uL (ref 150–400)
RBC: 4.33 MIL/uL (ref 4.22–5.81)
RDW: 15.3 % (ref 11.5–15.5)
WBC: 11.9 10*3/uL — ABNORMAL HIGH (ref 4.0–10.5)
nRBC: 0 % (ref 0.0–0.2)

## 2020-04-19 LAB — TYPE AND SCREEN
ABO/RH(D): O POS
Antibody Screen: NEGATIVE

## 2020-04-19 LAB — SURGICAL PCR SCREEN
MRSA, PCR: NEGATIVE
Staphylococcus aureus: NEGATIVE

## 2020-04-19 LAB — SARS CORONAVIRUS 2 (TAT 6-24 HRS): SARS Coronavirus 2: NEGATIVE

## 2020-04-19 NOTE — Progress Notes (Signed)
PCP -  Dr Posey Rea Cardiologist - n/a  Chest x-ray - 02/17/20 (2V) EKG - 04/19/20 Stress Test - n/a ECHO - 03/10/07 Cardiac Cath - n/a  Coronavirus Screening Covid test scheduled on 04/19/20. Do you have any of the following symptoms:  Cough yes/no: No Fever (>100.70F)  yes/no: No Runny nose yes/no: No Sore throat yes/no: No Difficulty breathing/shortness of breath  yes/no: No  Have you traveled in the last 14 days and where? yes/no: No  Patient verbalized understanding of instructions that were given to them at the PAT appointment.

## 2020-04-19 NOTE — Progress Notes (Signed)
[id number] [place], [place] - [address] [place] [address] Phone: [phone] Fax: [phone]      Your procedure is scheduled on Friday, 04/21/20.  Report to [facility] Main Entrance "A" at 7 A.M., and check in at the Admitting office.  Call this number if you have problems the morning of surgery:  [phone]  Call [phone] if you have any questions prior to your surgery date Monday-Friday 8am-4pm    Remember:  Do not eat or drink after midnight Thursday.      Take these medicines the morning of surgery with A SIP OF WATER  allopurinol (ZYLOPRIM)  amLODipine (NORVASC) predniSONE (DELTASONE) if needed traMADol (ULTRAM) if needed  As of today, STOP taking any Aspirin (unless otherwise instructed by your surgeon) Aleve, Naproxen, Ibuprofen, Motrin, Advil, Goody's, BC's, all herbal medications, fish oil, and all vitamins.                      Do not wear jewelry.            Do not wear lotions, powders, colognes, or deodorant.            Men may shave face and neck.            Do not bring valuables to the hospital.            [facility] is not responsible for any belongings or valuables.  Do NOT Smoke (Tobacco/Vaping) or drink Alcohol 24 hours prior to your procedure If you use a CPAP at night, you may bring all equipment for your overnight stay.   Contacts, glasses, dentures or bridgework may not be worn into surgery.      For patients admitted to the hospital, discharge time will be determined by your treatment team.   Patients discharged the day of surgery will not be allowed to drive home, and someone needs to stay with them for 24 hours.    Special instructions:   [place]- Preparing For Surgery  Before surgery, you can play an important role. Because skin is not sterile, your skin needs to be as free of germs as possible. You can reduce the number of germs on your skin by washing with CHG (chlorahexidine  gluconate) Soap before surgery.  CHG is an antiseptic cleaner which kills germs and bonds with the skin to continue killing germs even after washing.    Oral Hygiene is also important to reduce your risk of infection.  Remember - BRUSH YOUR TEETH THE MORNING OF SURGERY WITH YOUR REGULAR TOOTHPASTE  Please do not use if you have an allergy to CHG or antibacterial soaps. If your skin becomes reddened/irritated stop using the CHG.  Do not shave (including legs and underarms) for at least 48 hours prior to first CHG shower. It is OK to shave your face.  Please follow these instructions carefully.   1. Shower the NIGHT BEFORE SURGERY Thurs and the MORNING OF SURGERY Fri with CHG Soap.   2. If you chose to wash your hair, wash your hair first as usual with your normal shampoo.  3. After you shampoo, rinse your hair and body thoroughly to remove the shampoo.  4. Use CHG as you would any other liquid soap. You can apply CHG directly to the skin and wash gently with a scrungie or a clean washcloth.   5. Apply the CHG Soap to your body ONLY FROM THE NECK DOWN.  Do not use on open wounds or open sores. Avoid contact with your eyes, ears, mouth and genitals (private parts). Wash Face and genitals (private parts)  with your normal soap.   6. Wash thoroughly, paying special attention to the area where your surgery will be performed.  7. Thoroughly rinse your body with warm water from the neck down.  8. DO NOT shower/wash with your normal soap after using and rinsing off the CHG Soap.  9. Pat yourself dry with a CLEAN TOWEL.  10. Wear CLEAN PAJAMAS to bed the night before surgery  11. Place CLEAN SHEETS on your bed the night of your first shower and DO NOT SLEEP WITH PETS.   Day of Surgery: Wear Clean/Comfortable clothing the morning of surgery Do not apply any deodorants/lotions.   Remember to brush your teeth WITH YOUR REGULAR TOOTHPASTE.   Please read over the following fact sheets that you  were given.

## 2020-04-20 NOTE — Anesthesia Preprocedure Evaluation (Addendum)
Anesthesia Evaluation  Patient identified by MRN, date of birth, ID band Patient awake    Reviewed: Allergy & Precautions, NPO status , Patient's Chart, lab work & pertinent test results  Airway Mallampati: II  TM Distance: >3 FB Neck ROM: Full    Dental  (+) Dental Advisory Given   Pulmonary Current Smoker,    breath sounds clear to auscultation       Cardiovascular hypertension, Pt. on medications  Rhythm:Regular Rate:Normal     Neuro/Psych  Neuromuscular disease    GI/Hepatic negative GI ROS, Neg liver ROS,   Endo/Other  negative endocrine ROS  Renal/GU negative Renal ROS     Musculoskeletal  (+) Arthritis ,   Abdominal   Peds  Hematology negative hematology ROS (+)   Anesthesia Other Findings   Reproductive/Obstetrics                             Lab Results  Component Value Date   WBC 11.9 (H) 04/19/2020   HGB 14.9 04/19/2020   HCT 43.6 04/19/2020   MCV 100.7 (H) 04/19/2020   PLT 291 04/19/2020   Lab Results  Component Value Date   CREATININE 0.84 04/19/2020   BUN 9 04/19/2020   NA 139 04/19/2020   K 3.7 04/19/2020   CL 104 04/19/2020   CO2 26 04/19/2020    Anesthesia Physical Anesthesia Plan  ASA: II  Anesthesia Plan: General   Post-op Pain Management:    Induction: Intravenous  PONV Risk Score and Plan: 1 and Dexamethasone, Ondansetron and Treatment may vary due to age or medical condition  Airway Management Planned: Oral ETT  Additional Equipment: None  Intra-op Plan:   Post-operative Plan: Extubation in OR  Informed Consent: I have reviewed the patients History and Physical, chart, labs and discussed the procedure including the risks, benefits and alternatives for the proposed anesthesia with the patient or authorized representative who has indicated his/her understanding and acceptance.     Dental advisory given  Plan Discussed with:  CRNA  Anesthesia Plan Comments:        Anesthesia Quick Evaluation

## 2020-04-21 ENCOUNTER — Observation Stay (HOSPITAL_COMMUNITY)
Admission: RE | Admit: 2020-04-21 | Discharge: 2020-04-22 | Disposition: A | Discharge status: Home / Self Care | Payer: Medicare Other | Attending: Neurosurgery | Admitting: Neurosurgery

## 2020-04-21 ENCOUNTER — Inpatient Hospital Stay (HOSPITAL_COMMUNITY): Payer: Medicare Other

## 2020-04-21 ENCOUNTER — Inpatient Hospital Stay (HOSPITAL_COMMUNITY): Payer: Medicare Other | Admitting: Certified Registered Nurse Anesthetist

## 2020-04-21 ENCOUNTER — Encounter (HOSPITAL_COMMUNITY)
Admission: RE | Disposition: A | Discharge status: Home / Self Care | Payer: Self-pay | Source: Home / Self Care | Attending: Neurosurgery

## 2020-04-21 ENCOUNTER — Other Ambulatory Visit: Payer: Self-pay

## 2020-04-21 ENCOUNTER — Encounter (HOSPITAL_COMMUNITY): Payer: Self-pay | Admitting: Neurosurgery

## 2020-04-21 DIAGNOSIS — M5117 Intervertebral disc disorders with radiculopathy, lumbosacral region: Secondary | ICD-10-CM | POA: Diagnosis not present

## 2020-04-21 DIAGNOSIS — Z79899 Other long term (current) drug therapy: Secondary | ICD-10-CM | POA: Insufficient documentation

## 2020-04-21 DIAGNOSIS — I10 Essential (primary) hypertension: Secondary | ICD-10-CM | POA: Insufficient documentation

## 2020-04-21 DIAGNOSIS — M545 Low back pain: Secondary | ICD-10-CM | POA: Diagnosis present

## 2020-04-21 DIAGNOSIS — M48061 Spinal stenosis, lumbar region without neurogenic claudication: Secondary | ICD-10-CM | POA: Diagnosis not present

## 2020-04-21 DIAGNOSIS — M4727 Other spondylosis with radiculopathy, lumbosacral region: Secondary | ICD-10-CM | POA: Diagnosis not present

## 2020-04-21 DIAGNOSIS — M4807 Spinal stenosis, lumbosacral region: Secondary | ICD-10-CM | POA: Diagnosis not present

## 2020-04-21 DIAGNOSIS — M4316 Spondylolisthesis, lumbar region: Secondary | ICD-10-CM | POA: Diagnosis not present

## 2020-04-21 DIAGNOSIS — F17210 Nicotine dependence, cigarettes, uncomplicated: Secondary | ICD-10-CM | POA: Diagnosis not present

## 2020-04-21 DIAGNOSIS — M21372 Foot drop, left foot: Secondary | ICD-10-CM | POA: Insufficient documentation

## 2020-04-21 DIAGNOSIS — R55 Syncope and collapse: Secondary | ICD-10-CM | POA: Diagnosis not present

## 2020-04-21 DIAGNOSIS — E559 Vitamin D deficiency, unspecified: Secondary | ICD-10-CM | POA: Diagnosis not present

## 2020-04-21 DIAGNOSIS — Z419 Encounter for procedure for purposes other than remedying health state, unspecified: Secondary | ICD-10-CM

## 2020-04-21 DIAGNOSIS — Z981 Arthrodesis status: Secondary | ICD-10-CM | POA: Diagnosis not present

## 2020-04-21 DIAGNOSIS — M5116 Intervertebral disc disorders with radiculopathy, lumbar region: Secondary | ICD-10-CM | POA: Insufficient documentation

## 2020-04-21 DIAGNOSIS — M48062 Spinal stenosis, lumbar region with neurogenic claudication: Secondary | ICD-10-CM | POA: Diagnosis not present

## 2020-04-21 LAB — ABO/RH: ABO/RH(D): O POS

## 2020-04-21 IMAGING — RF DG LUMBAR SPINE 2-3V
1 series · 3 of 3 positions shown · non-contrast
Comparison: Lumbar radiographs [DATE].  Lumbar MRI [DATE].

CLINICAL DATA: 78-year-old male undergoing lumbar surgery.

EXAM:
LUMBAR SPINE - 2-3 VIEW; DG C-ARM 1-60 MIN

[Series 1: run · 3 of 3 slices shown]
[im 1/3]
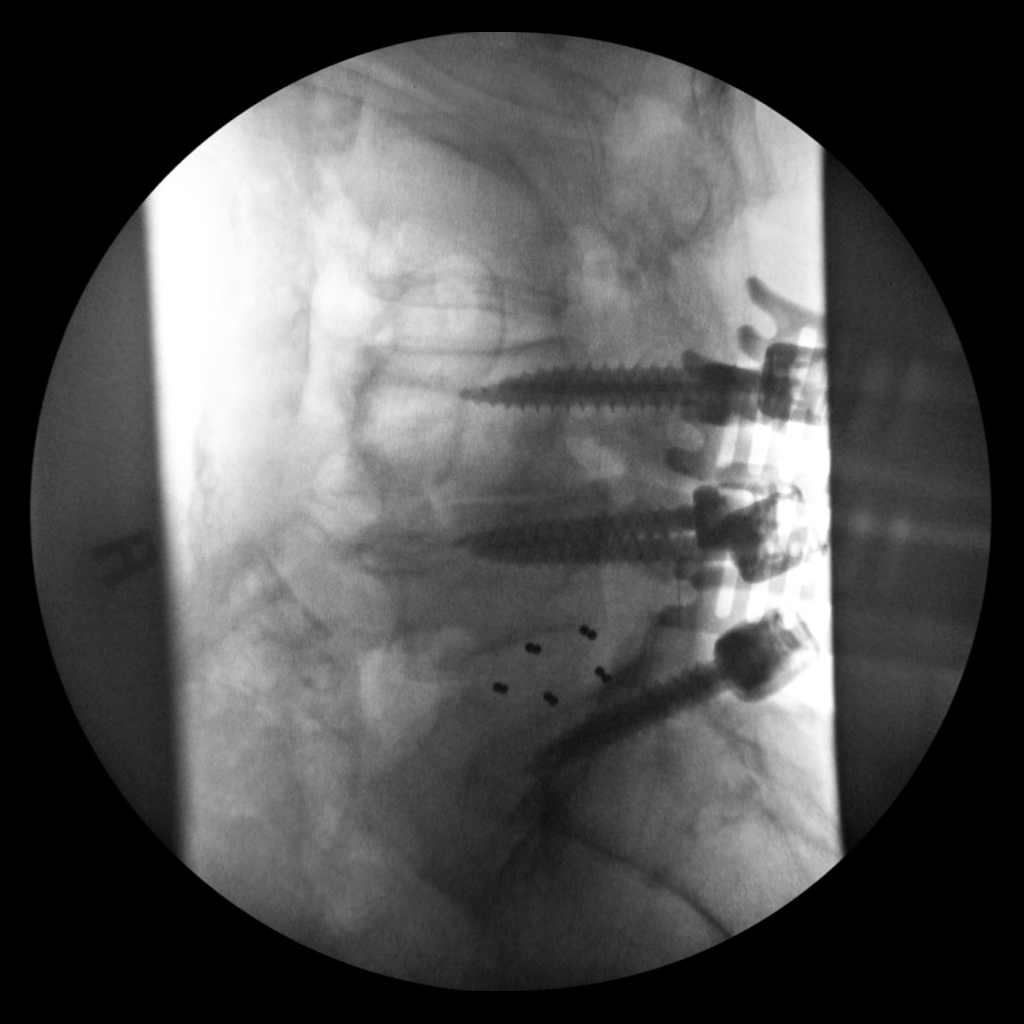
[im 2/3]
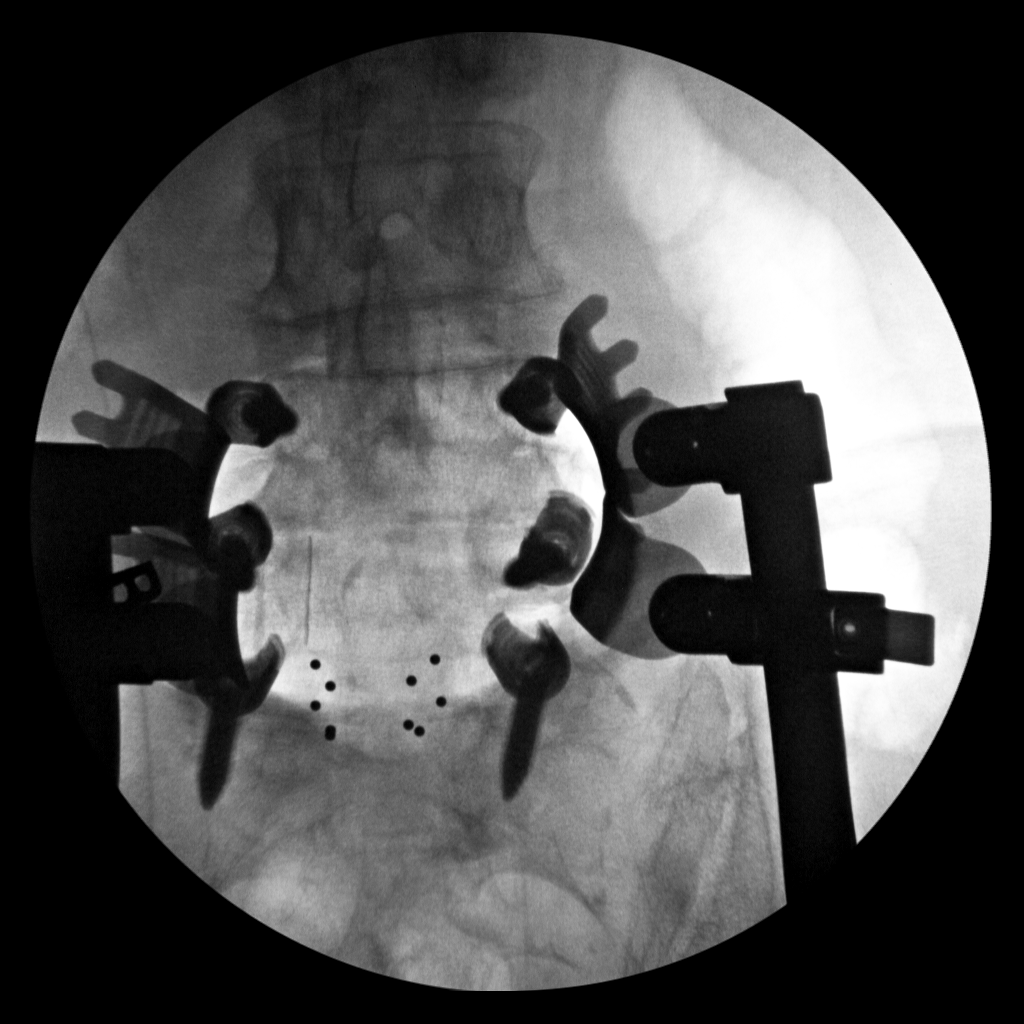
[im 3/3]
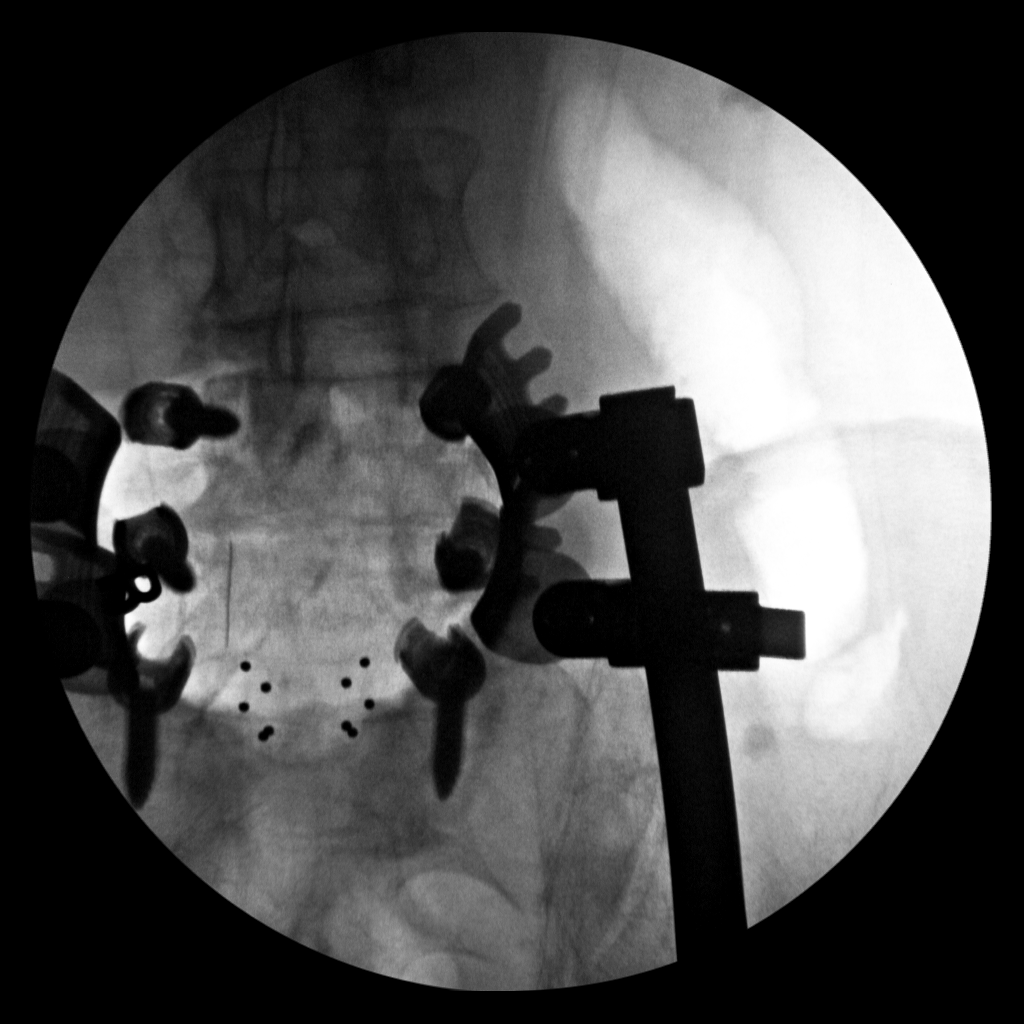

[3 of 3 positions shown; findings below may reference images not displayed]

FINDINGS: Three intraoperative fluoroscopic spot views of the lower lumbar
spine. Normal lumbar segmentation, same numbering system is used on
the prior MRI. These images demonstrate bilateral pedicle screw
placement L4 through S1, with interbody implant at L5-S1. Posterior
connecting rods are not yet in place.

FLUOROSCOPY TIME:  0 minutes 54 seconds
IMPRESSION: L4 through S1 posterior and/or interbody fusion depicted.

## 2020-04-21 IMAGING — RF DG C-ARM 1-60 MIN
1 series · 3 of 3 positions shown · non-contrast
Comparison: Lumbar radiographs [DATE].  Lumbar MRI [DATE].

CLINICAL DATA: 78-year-old male undergoing lumbar surgery.

EXAM:
LUMBAR SPINE - 2-3 VIEW; DG C-ARM 1-60 MIN

[Series 1: run · 3 of 3 slices shown]
[im 1/3]
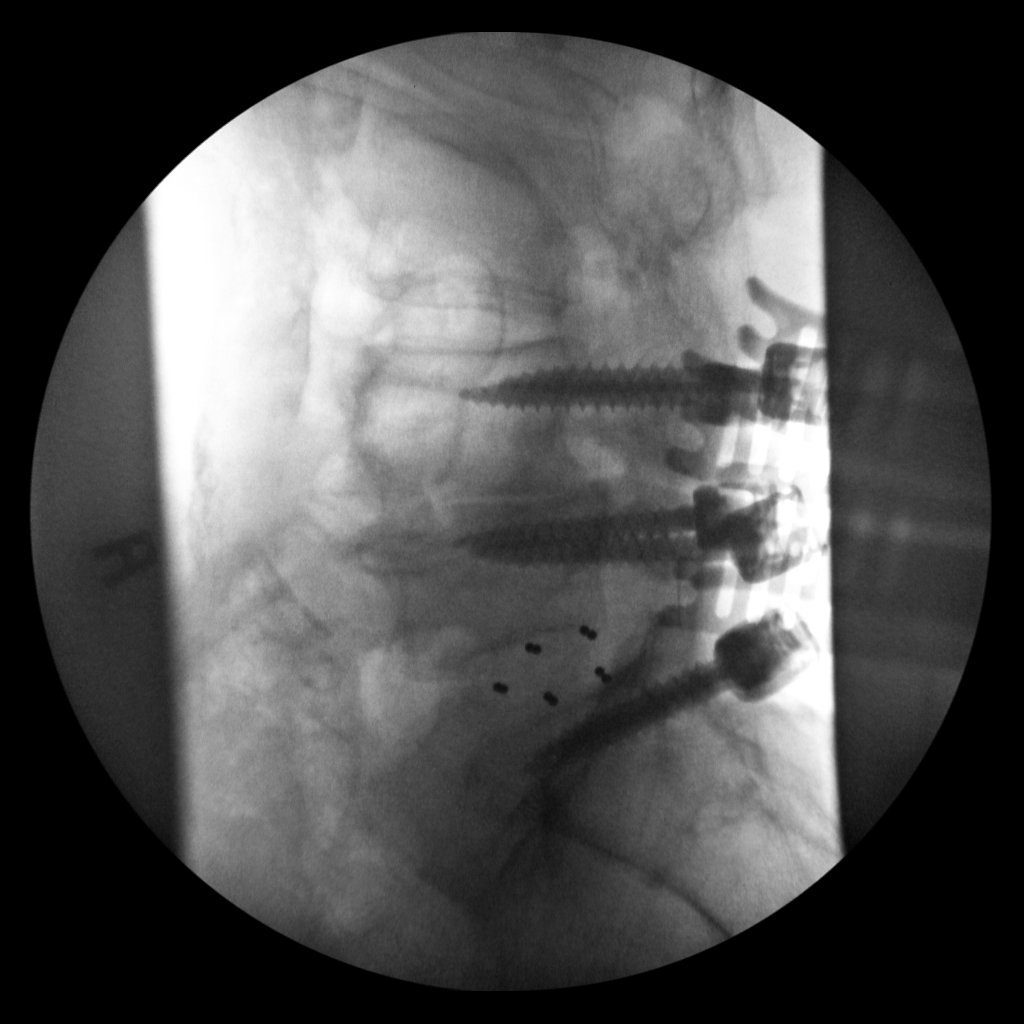
[im 2/3]
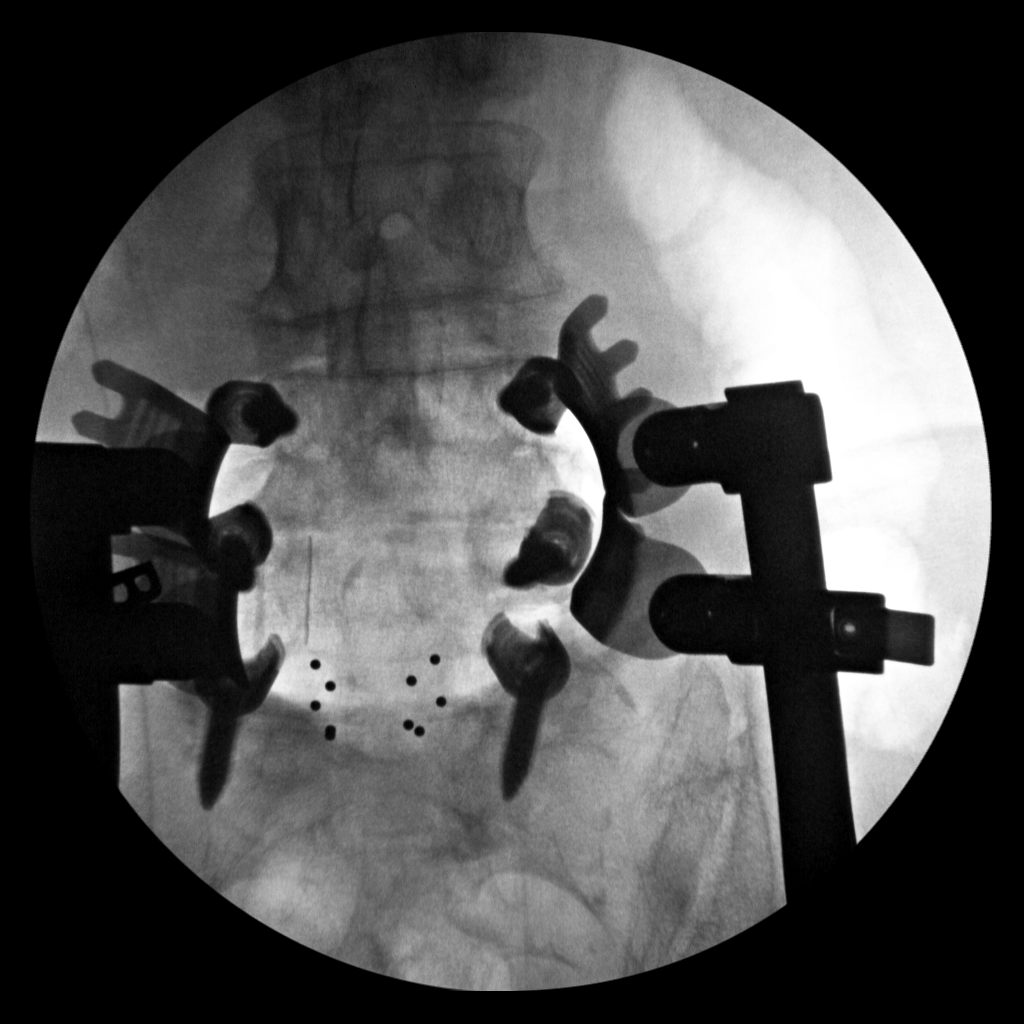
[im 3/3]
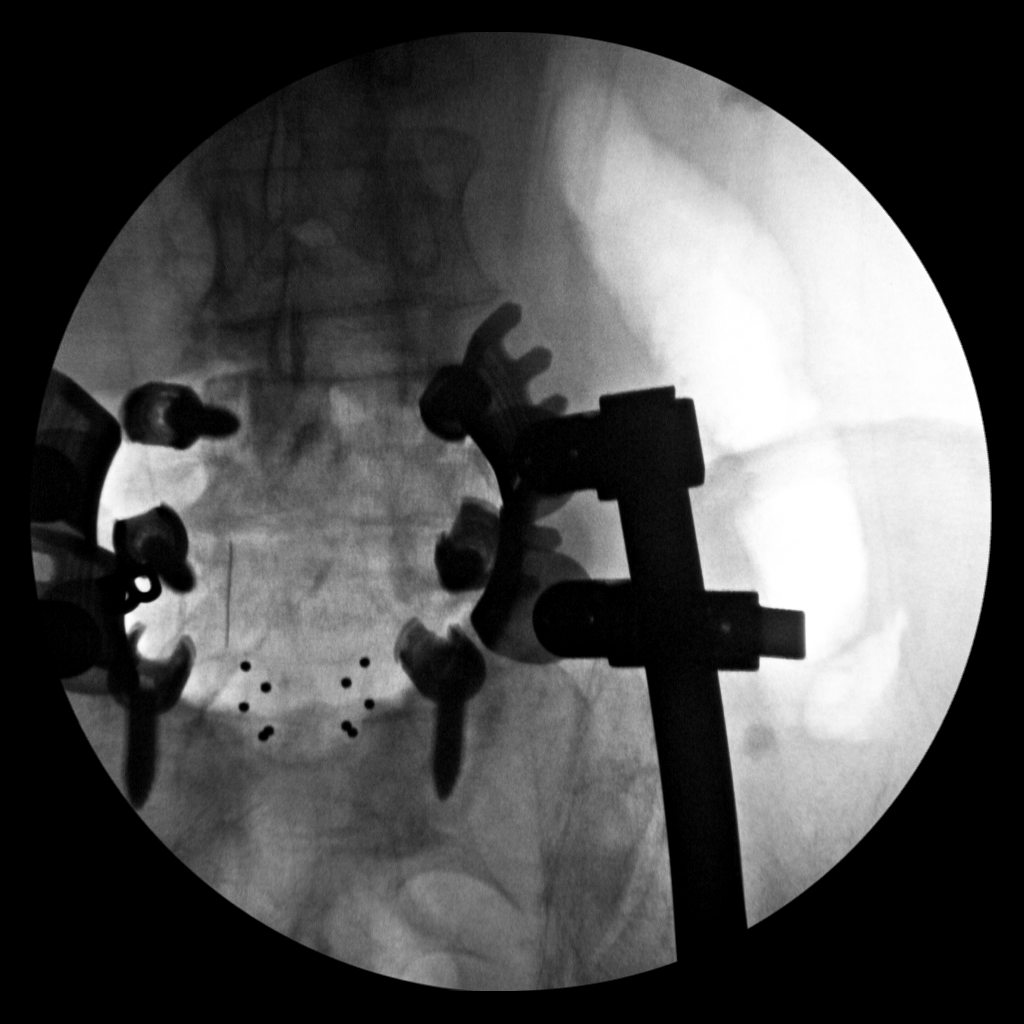

[3 of 3 positions shown; findings below may reference images not displayed]

FINDINGS: Three intraoperative fluoroscopic spot views of the lower lumbar
spine. Normal lumbar segmentation, same numbering system is used on
the prior MRI. These images demonstrate bilateral pedicle screw
placement L4 through S1, with interbody implant at L5-S1. Posterior
connecting rods are not yet in place.

FLUOROSCOPY TIME:  0 minutes 54 seconds
IMPRESSION: L4 through S1 posterior and/or interbody fusion depicted.

## 2020-04-21 SURGERY — POSTERIOR LUMBAR FUSION 2 LEVEL
Anesthesia: General | Site: Back | Laterality: Left

## 2020-04-21 MED ORDER — BUPIVACAINE LIPOSOME 1.3 % IJ SUSP
20.0000 mL | Freq: Once | INTRAMUSCULAR | Status: DC
  Filled 2020-04-21: qty 20

## 2020-04-21 MED ORDER — HYDROMORPHONE HCL 1 MG/ML IJ SOLN: 0.5000 mg | INTRAMUSCULAR | Status: DC | PRN

## 2020-04-21 MED ORDER — METHOCARBAMOL 1000 MG/10ML IJ SOLN
500.0000 mg | Freq: Four times a day (QID) | INTRAVENOUS | Status: DC | PRN
  Filled 2020-04-21: qty 5

## 2020-04-21 MED ORDER — THROMBIN 20000 UNITS EX SOLR
CUTANEOUS | Status: AC
  Filled 2020-04-21: qty 20000

## 2020-04-21 MED ORDER — SODIUM CHLORIDE 0.9% FLUSH: 3.0000 mL | INTRAVENOUS | Status: DC | PRN

## 2020-04-21 MED ORDER — LIDOCAINE 2% (20 MG/ML) 5 ML SYRINGE
INTRAMUSCULAR | Status: DC | PRN
  Administered 2020-04-21: 60 mg via INTRAVENOUS

## 2020-04-21 MED ORDER — LIDOCAINE-EPINEPHRINE 1 %-1:100000 IJ SOLN
INTRAMUSCULAR | Status: AC
  Filled 2020-04-21: qty 1

## 2020-04-21 MED ORDER — SODIUM CHLORIDE 0.9% FLUSH: 3.0000 mL | Freq: Two times a day (BID) | INTRAVENOUS | Status: DC

## 2020-04-21 MED ORDER — FENTANYL CITRATE (PF) 100 MCG/2ML IJ SOLN
INTRAMUSCULAR | Status: AC
  Filled 2020-04-21: qty 2

## 2020-04-21 MED ORDER — OXYCODONE HCL 5 MG PO TABS: 5.0000 mg | ORAL_TABLET | ORAL | Status: DC | PRN

## 2020-04-21 MED ORDER — ORAL CARE MOUTH RINSE: 15.0000 mL | Freq: Once | OROMUCOSAL | Status: AC

## 2020-04-21 MED ORDER — ROCURONIUM BROMIDE 10 MG/ML (PF) SYRINGE
PREFILLED_SYRINGE | INTRAVENOUS | Status: AC
  Filled 2020-04-21: qty 10

## 2020-04-21 MED ORDER — THROMBIN 5000 UNITS EX SOLR
CUTANEOUS | Status: AC
  Filled 2020-04-21: qty 5000

## 2020-04-21 MED ORDER — KCL IN DEXTROSE-NACL 20-5-0.45 MEQ/L-%-% IV SOLN: INTRAVENOUS | Status: DC

## 2020-04-21 MED ORDER — METHOCARBAMOL 500 MG PO TABS
500.0000 mg | ORAL_TABLET | Freq: Four times a day (QID) | ORAL | Status: DC | PRN
  Administered 2020-04-21 – 2020-04-22 (×3): 500 mg via ORAL
  Filled 2020-04-21 (×2): qty 1

## 2020-04-21 MED ORDER — PHENOL 1.4 % MT LIQD: 1.0000 | OROMUCOSAL | Status: DC | PRN

## 2020-04-21 MED ORDER — POLYETHYLENE GLYCOL 3350 17 G PO PACK: 17.0000 g | PACK | Freq: Every day | ORAL | Status: DC | PRN

## 2020-04-21 MED ORDER — THROMBIN 5000 UNITS EX SOLR
OROMUCOSAL | Status: DC | PRN
  Administered 2020-04-21: 5 mL via TOPICAL

## 2020-04-21 MED ORDER — HYDROCODONE-ACETAMINOPHEN 5-325 MG PO TABS
2.0000 | ORAL_TABLET | ORAL | Status: DC | PRN
  Administered 2020-04-21 – 2020-04-22 (×5): 2 via ORAL
  Filled 2020-04-21 (×5): qty 2

## 2020-04-21 MED ORDER — CHLORHEXIDINE GLUCONATE CLOTH 2 % EX PADS: 6.0000 | MEDICATED_PAD | Freq: Once | CUTANEOUS | Status: DC

## 2020-04-21 MED ORDER — LIDOCAINE-EPINEPHRINE 1 %-1:100000 IJ SOLN
INTRAMUSCULAR | Status: DC | PRN
  Administered 2020-04-21: 5 mL

## 2020-04-21 MED ORDER — PROPOFOL 10 MG/ML IV BOLUS
INTRAVENOUS | Status: AC
  Filled 2020-04-21: qty 40

## 2020-04-21 MED ORDER — DOCUSATE SODIUM 100 MG PO CAPS
100.0000 mg | ORAL_CAPSULE | Freq: Two times a day (BID) | ORAL | Status: DC
  Administered 2020-04-21 – 2020-04-22 (×3): 100 mg via ORAL
  Filled 2020-04-21 (×3): qty 1

## 2020-04-21 MED ORDER — CHLORHEXIDINE GLUCONATE 0.12 % MT SOLN
15.0000 mL | Freq: Once | OROMUCOSAL | Status: AC
  Administered 2020-04-21: 15 mL via OROMUCOSAL
  Filled 2020-04-21: qty 15

## 2020-04-21 MED ORDER — TAMSULOSIN HCL 0.4 MG PO CAPS
0.4000 mg | ORAL_CAPSULE | Freq: Every day | ORAL | Status: DC
  Administered 2020-04-22: 0.4 mg via ORAL
  Filled 2020-04-21: qty 1

## 2020-04-21 MED ORDER — SUGAMMADEX SODIUM 200 MG/2ML IV SOLN
INTRAVENOUS | Status: DC | PRN
  Administered 2020-04-21 (×2): 200 mg via INTRAVENOUS

## 2020-04-21 MED ORDER — AMISULPRIDE (ANTIEMETIC) 5 MG/2ML IV SOLN: 10.0000 mg | Freq: Once | INTRAVENOUS | Status: DC | PRN

## 2020-04-21 MED ORDER — HYDROCODONE-ACETAMINOPHEN 5-325 MG PO TABS: 1.0000 | ORAL_TABLET | Freq: Two times a day (BID) | ORAL | Status: DC | PRN

## 2020-04-21 MED ORDER — DEXAMETHASONE SODIUM PHOSPHATE 10 MG/ML IJ SOLN
INTRAMUSCULAR | Status: AC
  Filled 2020-04-21: qty 1

## 2020-04-21 MED ORDER — TRAMADOL HCL 50 MG PO TABS: 50.0000 mg | ORAL_TABLET | Freq: Four times a day (QID) | ORAL | Status: DC | PRN

## 2020-04-21 MED ORDER — FENTANYL CITRATE (PF) 250 MCG/5ML IJ SOLN
INTRAMUSCULAR | Status: AC
  Filled 2020-04-21: qty 5

## 2020-04-21 MED ORDER — MENTHOL 3 MG MT LOZG: 1.0000 | LOZENGE | OROMUCOSAL | Status: DC | PRN

## 2020-04-21 MED ORDER — FENTANYL CITRATE (PF) 100 MCG/2ML IJ SOLN
25.0000 ug | INTRAMUSCULAR | Status: DC | PRN
  Administered 2020-04-21: 25 ug via INTRAVENOUS
  Administered 2020-04-21 (×2): 50 ug via INTRAVENOUS
  Administered 2020-04-21: 25 ug via INTRAVENOUS

## 2020-04-21 MED ORDER — ROCURONIUM BROMIDE 10 MG/ML (PF) SYRINGE
PREFILLED_SYRINGE | INTRAVENOUS | Status: DC | PRN
  Administered 2020-04-21 (×3): 50 mg via INTRAVENOUS

## 2020-04-21 MED ORDER — PROPOFOL 10 MG/ML IV BOLUS
INTRAVENOUS | Status: DC | PRN
  Administered 2020-04-21: 30 mg via INTRAVENOUS
  Administered 2020-04-21: 100 mg via INTRAVENOUS

## 2020-04-21 MED ORDER — ONDANSETRON HCL 4 MG/2ML IJ SOLN
INTRAMUSCULAR | Status: DC | PRN
  Administered 2020-04-21: 4 mg via INTRAVENOUS

## 2020-04-21 MED ORDER — BUPIVACAINE HCL (PF) 0.5 % IJ SOLN
INTRAMUSCULAR | Status: AC
  Filled 2020-04-21: qty 30

## 2020-04-21 MED ORDER — FENTANYL CITRATE (PF) 100 MCG/2ML IJ SOLN
INTRAMUSCULAR | Status: AC
Start: 2020-04-21 — End: 2020-04-22
  Filled 2020-04-21: qty 2

## 2020-04-21 MED ORDER — METHOCARBAMOL 500 MG PO TABS
ORAL_TABLET | ORAL | Status: AC
  Filled 2020-04-21: qty 1

## 2020-04-21 MED ORDER — VITAMIN D 25 MCG (1000 UNIT) PO TABS
1000.0000 [IU] | ORAL_TABLET | Freq: Every day | ORAL | Status: DC
  Administered 2020-04-22: 1000 [IU] via ORAL
  Filled 2020-04-21: qty 1

## 2020-04-21 MED ORDER — PANTOPRAZOLE SODIUM 40 MG PO TBEC
40.0000 mg | DELAYED_RELEASE_TABLET | Freq: Every day | ORAL | Status: DC
  Administered 2020-04-21: 40 mg via ORAL
  Filled 2020-04-21: qty 1

## 2020-04-21 MED ORDER — ACETAMINOPHEN 500 MG PO TABS
1000.0000 mg | ORAL_TABLET | Freq: Once | ORAL | Status: AC
  Administered 2020-04-21: 1000 mg via ORAL
  Filled 2020-04-21: qty 2

## 2020-04-21 MED ORDER — AMLODIPINE BESYLATE 5 MG PO TABS
5.0000 mg | ORAL_TABLET | Freq: Every day | ORAL | Status: DC
  Administered 2020-04-21 – 2020-04-22 (×2): 5 mg via ORAL
  Filled 2020-04-21 (×2): qty 1

## 2020-04-21 MED ORDER — ZOLPIDEM TARTRATE 5 MG PO TABS
5.0000 mg | ORAL_TABLET | Freq: Every evening | ORAL | Status: DC | PRN
  Administered 2020-04-21: 5 mg via ORAL
  Filled 2020-04-21: qty 1

## 2020-04-21 MED ORDER — ACETAMINOPHEN 325 MG PO TABS: 650.0000 mg | ORAL_TABLET | ORAL | Status: DC | PRN

## 2020-04-21 MED ORDER — DEXAMETHASONE SODIUM PHOSPHATE 10 MG/ML IJ SOLN
INTRAMUSCULAR | Status: DC | PRN
  Administered 2020-04-21: 10 mg via INTRAVENOUS

## 2020-04-21 MED ORDER — FENTANYL CITRATE (PF) 250 MCG/5ML IJ SOLN
INTRAMUSCULAR | Status: DC | PRN
Start: 2020-04-21 — End: 2020-04-21
  Administered 2020-04-21 (×4): 50 ug via INTRAVENOUS

## 2020-04-21 MED ORDER — ONDANSETRON HCL 4 MG/2ML IJ SOLN
INTRAMUSCULAR | Status: AC
  Filled 2020-04-21: qty 2

## 2020-04-21 MED ORDER — TRIAMCINOLONE ACETONIDE 0.5 % EX CREA
1.0000 "application " | TOPICAL_CREAM | Freq: Every day | CUTANEOUS | Status: DC | PRN
  Filled 2020-04-21: qty 15

## 2020-04-21 MED ORDER — LACTATED RINGERS IV SOLN: INTRAVENOUS | Status: DC

## 2020-04-21 MED ORDER — CEFAZOLIN SODIUM-DEXTROSE 2-4 GM/100ML-% IV SOLN
2.0000 g | Freq: Three times a day (TID) | INTRAVENOUS | Status: AC
  Administered 2020-04-21 – 2020-04-22 (×2): 2 g via INTRAVENOUS
  Filled 2020-04-21 (×2): qty 100

## 2020-04-21 MED ORDER — LIDOCAINE 2% (20 MG/ML) 5 ML SYRINGE
INTRAMUSCULAR | Status: AC
  Filled 2020-04-21: qty 5

## 2020-04-21 MED ORDER — BUPIVACAINE HCL (PF) 0.5 % IJ SOLN
INTRAMUSCULAR | Status: DC | PRN
  Administered 2020-04-21: 5 mL

## 2020-04-21 MED ORDER — FLEET ENEMA 7-19 GM/118ML RE ENEM: 1.0000 | ENEMA | Freq: Once | RECTAL | Status: DC | PRN

## 2020-04-21 MED ORDER — BUPIVACAINE LIPOSOME 1.3 % IJ SUSP
INTRAMUSCULAR | Status: DC | PRN
  Administered 2020-04-21: 20 mL

## 2020-04-21 MED ORDER — PANTOPRAZOLE SODIUM 40 MG IV SOLR: 40.0000 mg | Freq: Every day | INTRAVENOUS | Status: DC

## 2020-04-21 MED ORDER — SODIUM CHLORIDE 0.9 % IV SOLN: 250.0000 mL | INTRAVENOUS | Status: DC

## 2020-04-21 MED ORDER — ACETAMINOPHEN 650 MG RE SUPP: 650.0000 mg | RECTAL | Status: DC | PRN

## 2020-04-21 MED ORDER — ONDANSETRON HCL 4 MG/2ML IJ SOLN: 4.0000 mg | Freq: Four times a day (QID) | INTRAMUSCULAR | Status: DC | PRN

## 2020-04-21 MED ORDER — PHENYLEPHRINE HCL-NACL 10-0.9 MG/250ML-% IV SOLN
INTRAVENOUS | Status: DC | PRN
  Administered 2020-04-21: 15 ug/min via INTRAVENOUS

## 2020-04-21 MED ORDER — BISACODYL 10 MG RE SUPP: 10.0000 mg | Freq: Every day | RECTAL | Status: DC | PRN

## 2020-04-21 MED ORDER — ONDANSETRON HCL 4 MG PO TABS: 4.0000 mg | ORAL_TABLET | Freq: Four times a day (QID) | ORAL | Status: DC | PRN

## 2020-04-21 MED ORDER — CEFAZOLIN SODIUM-DEXTROSE 2-4 GM/100ML-% IV SOLN
2.0000 g | INTRAVENOUS | Status: AC
  Administered 2020-04-21: 2 g via INTRAVENOUS
  Filled 2020-04-21: qty 100

## 2020-04-21 MED ORDER — ALUM & MAG HYDROXIDE-SIMETH 200-200-20 MG/5ML PO SUSP: 30.0000 mL | Freq: Four times a day (QID) | ORAL | Status: DC | PRN

## 2020-04-21 MED ORDER — 0.9 % SODIUM CHLORIDE (POUR BTL) OPTIME
TOPICAL | Status: DC | PRN
  Administered 2020-04-21: 1000 mL

## 2020-04-21 SURGICAL SUPPLY — 73 items
ADH SKN CLS APL DERMABOND .7 (GAUZE/BANDAGES/DRESSINGS) ×1
BASKET BONE COLLECTION (BASKET) ×3 IMPLANT
BLADE CLIPPER SURG (BLADE) IMPLANT
BUR MATCHSTICK NEURO 3.0 LAGG (BURR) ×3 IMPLANT
BUR PRECISION FLUTE 5.0 (BURR) ×3 IMPLANT
CAGE COROENT LG 10X9X23-12 (Cage) ×6 IMPLANT
CANISTER SUCT 3000ML PPV (MISCELLANEOUS) ×3 IMPLANT
CARTRIDGE OIL MAESTRO DRILL (MISCELLANEOUS) ×1 IMPLANT
CNTNR URN SCR LID CUP LEK RST (MISCELLANEOUS) ×1 IMPLANT
CONT SPEC 4OZ STRL OR WHT (MISCELLANEOUS) ×3
COVER BACK TABLE 60X90IN (DRAPES) ×3 IMPLANT
COVER WAND RF STERILE (DRAPES) ×3 IMPLANT
DECANTER SPIKE VIAL GLASS SM (MISCELLANEOUS) ×3 IMPLANT
DERMABOND ADVANCED (GAUZE/BANDAGES/DRESSINGS) ×2
DERMABOND ADVANCED .7 DNX12 (GAUZE/BANDAGES/DRESSINGS) ×1 IMPLANT
DIFFUSER DRILL AIR PNEUMATIC (MISCELLANEOUS) ×3 IMPLANT
DRAPE C-ARM 42X72 X-RAY (DRAPES) ×3 IMPLANT
DRAPE C-ARMOR (DRAPES) ×3 IMPLANT
DRAPE LAPAROTOMY 100X72X124 (DRAPES) ×3 IMPLANT
DRAPE SURG 17X23 STRL (DRAPES) ×3 IMPLANT
DRSG OPSITE POSTOP 4X6 (GAUZE/BANDAGES/DRESSINGS) ×3 IMPLANT
DURAPREP 26ML APPLICATOR (WOUND CARE) ×3 IMPLANT
ELECT REM PT RETURN 9FT ADLT (ELECTROSURGICAL) ×3
ELECTRODE REM PT RTRN 9FT ADLT (ELECTROSURGICAL) ×1 IMPLANT
EVACUATOR 1/8 PVC DRAIN (DRAIN) ×3 IMPLANT
GAUZE 4X4 16PLY RFD (DISPOSABLE) IMPLANT
GAUZE SPONGE 4X4 12PLY STRL (GAUZE/BANDAGES/DRESSINGS) ×3 IMPLANT
GLOVE BIO SURGEON STRL SZ8 (GLOVE) ×9 IMPLANT
GLOVE BIOGEL PI IND STRL 8 (GLOVE) ×2 IMPLANT
GLOVE BIOGEL PI IND STRL 8.5 (GLOVE) ×3 IMPLANT
GLOVE BIOGEL PI INDICATOR 8 (GLOVE) ×4
GLOVE BIOGEL PI INDICATOR 8.5 (GLOVE) ×6
GLOVE ECLIPSE 7.5 STRL STRAW (GLOVE) ×15 IMPLANT
GLOVE ECLIPSE 8.0 STRL XLNG CF (GLOVE) ×6 IMPLANT
GLOVE EXAM NITRILE XL STR (GLOVE) ×9 IMPLANT
GLOVE SURG SS PI 8.0 STRL IVOR (GLOVE) ×9 IMPLANT
GOWN STRL REUS W/ TWL LRG LVL3 (GOWN DISPOSABLE) IMPLANT
GOWN STRL REUS W/ TWL XL LVL3 (GOWN DISPOSABLE) ×3 IMPLANT
GOWN STRL REUS W/TWL 2XL LVL3 (GOWN DISPOSABLE) ×9 IMPLANT
GOWN STRL REUS W/TWL LRG LVL3 (GOWN DISPOSABLE)
GOWN STRL REUS W/TWL XL LVL3 (GOWN DISPOSABLE) ×9
HEMOSTAT POWDER KIT SURGIFOAM (HEMOSTASIS) ×3 IMPLANT
KIT BASIN OR (CUSTOM PROCEDURE TRAY) ×3 IMPLANT
KIT INFUSE XX SMALL 0.7CC (Orthopedic Implant) ×3 IMPLANT
KIT POSITION SURG JACKSON T1 (MISCELLANEOUS) ×3 IMPLANT
KIT TURNOVER KIT B (KITS) ×3 IMPLANT
MILL MEDIUM DISP (BLADE) ×3 IMPLANT
NEEDLE HYPO 25X1 1.5 SAFETY (NEEDLE) ×3 IMPLANT
NEEDLE SPNL 18GX3.5 QUINCKE PK (NEEDLE) ×3 IMPLANT
NS IRRIG 1000ML POUR BTL (IV SOLUTION) ×3 IMPLANT
OIL CARTRIDGE MAESTRO DRILL (MISCELLANEOUS) ×3
PACK LAMINECTOMY NEURO (CUSTOM PROCEDURE TRAY) ×3 IMPLANT
PAD ARMBOARD 7.5X6 YLW CONV (MISCELLANEOUS) ×9 IMPLANT
PATTIES SURGICAL .5 X.5 (GAUZE/BANDAGES/DRESSINGS) IMPLANT
PATTIES SURGICAL .5 X1 (DISPOSABLE) IMPLANT
PATTIES SURGICAL 1X1 (DISPOSABLE) IMPLANT
ROD RELINE TI LORD 5.5X70 (Rod) ×3 IMPLANT
ROD RELINE-O LORDOTIC 5.5X60MM (Rod) ×3 IMPLANT
SCREW LOCK RELINE 5.5 TULIP (Screw) ×18 IMPLANT
SCREW RELINE-O POLY 6.5X40 (Screw) ×6 IMPLANT
SCREW RELINE-O POLY 6.5X45 (Screw) ×12 IMPLANT
SPONGE LAP 4X18 RFD (DISPOSABLE) IMPLANT
SPONGE SURGIFOAM ABS GEL 100 (HEMOSTASIS) IMPLANT
STAPLER SKIN PROX WIDE 3.9 (STAPLE) ×3 IMPLANT
SUT VIC AB 1 CT1 18XBRD ANBCTR (SUTURE) ×2 IMPLANT
SUT VIC AB 1 CT1 8-18 (SUTURE) ×6
SUT VIC AB 2-0 CT1 18 (SUTURE) ×6 IMPLANT
SUT VIC AB 3-0 SH 8-18 (SUTURE) ×6 IMPLANT
SYR 5ML LL (SYRINGE) IMPLANT
TOWEL GREEN STERILE (TOWEL DISPOSABLE) ×3 IMPLANT
TOWEL GREEN STERILE FF (TOWEL DISPOSABLE) ×3 IMPLANT
TRAY FOLEY MTR SLVR 16FR STAT (SET/KITS/TRAYS/PACK) ×3 IMPLANT
WATER STERILE IRR 1000ML POUR (IV SOLUTION) ×3 IMPLANT

## 2020-04-21 NOTE — Interval H&P Note (Signed)
History and Physical Interval Note:  04/21/2020 9:17 AM  Brent Turner  has presented today for surgery, with the diagnosis of Foraminal stenosis of lumbar region.  The various methods of treatment have been discussed with the patient and family. After consideration of risks, benefits and other options for treatment, the patient has consented to  Procedure(s) with comments: Left Lumbar 4-5, Lumbar 5 Sacral 1 Posterior lumbar interbody fusion with instrumentation only at Lumbar 5 Sacral 1 (Left) - rm 18 as a surgical intervention.  The patient's history has been reviewed, patient examined, no change in status, stable for surgery.  I have reviewed the patient's chart and labs.  Questions were answered to the patient's satisfaction.     Dorian Heckle

## 2020-04-21 NOTE — Brief Op Note (Signed)
04/21/2020  1:46 PM  PATIENT:  Brent Turner  79 y.o. male  PRE-OPERATIVE DIAGNOSIS:  Foraminal stenosis of lumbar region, lumbar spondylolisthesis, herniated lumbar disc, lumbar radiculopathy, left foot drop, lumbago  POST-OPERATIVE DIAGNOSIS:  Foraminal stenosis of lumbar region, lumbar spondylolisthesis, herniated lumbar disc, lumbar radiculopathy, left foot drop, lumbago  PROCEDURE:  Procedure(s): Lumbar four-five decompression, Lumbar five Sacral one Posterior lumbar interbody fusion with posterior instrumentation L 4 - S 1 levels with posterolateral arthrodesis with local autograft  SURGEON:  Surgeon(s) and Role:    * Glennice Marcos, MD - Primary  PHYSICIAN ASSISTANT: McDaniel, NP  ASSISTANTS: Poteat, RN   ANESTHESIA:   general  EBL:  100 mL   BLOOD ADMINISTERED:none  DRAINS: (Medium) Hemovact drain(s) in the epidural space with  Suction Open   LOCAL MEDICATIONS USED:  MARCAINE    and LIDOCAINE   SPECIMEN:  No Specimen  DISPOSITION OF SPECIMEN:  N/A  COUNTS:  YES  TOURNIQUET:  * No tourniquets in log *  DICTATION: Patient is 79-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 He has a severe left leg pain and weakness with a new onset foot drop. It was elected to take him to surgery for decompression and fusion at L4/5 and L5/S1 levels.  He has spondylolisthesis of L 4 on L 5.  Procedure: Patient was placed in a prone position on the Jackson table after smooth and uncomplicated induction of general endotracheal anesthesia. His low back was prepped and draped in usual sterile fashion with betadine scrub and DuraPrep after marking spinal anatomy with C arm. Area of incision was infiltrated with local lidocaine. Incision was made to the lumbodorsal fascia was incised and exposure was performed of the L4/5, L5/S1 spinous processes laminae facet joint and transverse processes. Intraoperative x-ray was obtained which confirmed correct  orientation. A total laminectomy of L4 and L5 was performed with disarticulation of the facet joints at this level and thorough decompression was performed of both L4, L5 and S1 nerve roots along with the common dural tube. There was densely adherent spondylytic material compressing the thecal sac and both L4 and L5 nerve roots.  Decompression was greater than would be typically performed for simple interbody fusion. A thorough discectomy and preparation of the endplates was performed at the L5/S1 level.  The interspaces were packed with PEEK cages (10 x 9 x 23 mm x 12 degree at L5/S1) . 10 cc Bone autograft was packed within the interspace bilaterally along with extra extra small BMP kit. After a thorough decompression with bilateral discectomy at L4/5, adherent ligament and overgrown bone was removed to decompress the patient's nerve roots widely.   The posterolateral region was extensively decorticated and pedicle probes were placed at L4, L5 and S1 bilaterally. Intraoperative fluoroscopy confirmed correct orientationin the AP and lateral plane. 40 x 6.5 mm pedicle screws were placed at S1 bilaterally and 45 x 6.5 mm screws placed at L5 bilaterally and 45x 6.5mm screws were placed at L4 bilaterally. Final x-rays demonstrated well-positioned interbody grafts and pedicle screw fixation.  600 mm lordotic rod was  placed on the right and 70 mm rod was placed on the left and locked down in situ and the posterolateral region was packed with 20 cc bone graft  on the right and a similar amount of autograft on the left. A medium Hemovac drain was placed and anchored with a stitch. Long-acting Marcaine was injected in the deep musculature. Fascia was closed with   1 Vicryl sutures skin edges were reapproximated 2 and 3-0 Vicryl sutures. The wound is dressed with Dermabond and an occlusive dressing. The patient was extubated in the operating room and taken to recovery in stable satisfactory condition having tolerated the  operation well. Counts were correct at the end of the case.    PLAN OF CARE: Admit to inpatient   PATIENT DISPOSITION:  PACU - hemodynamically stable.   Delay start of Pharmacological VTE agent (>24hrs) due to surgical blood loss or risk of bleeding: yes  

## 2020-04-21 NOTE — Anesthesia Procedure Notes (Signed)
Procedure Name: Intubation Performed by: Averey Koning B, CRNA Pre-anesthesia Checklist: Patient identified, Emergency Drugs available, Suction available and Patient being monitored Patient Re-evaluated:Patient Re-evaluated prior to induction Oxygen Delivery Method: Circle System Utilized Preoxygenation: Pre-oxygenation with 100% oxygen Induction Type: IV induction Ventilation: Mask ventilation without difficulty Laryngoscope Size: Mac and 4 Grade View: Grade I Tube type: Oral Tube size: 7.5 mm Number of attempts: 1 Airway Equipment and Method: Stylet and Oral airway Placement Confirmation: ETT inserted through vocal cords under direct vision,  positive ETCO2 and breath sounds checked- equal and bilateral Secured at: 23 cm Tube secured with: Tape Dental Injury: Teeth and Oropharynx as per pre-operative assessment        

## 2020-04-21 NOTE — Evaluation (Signed)
Physical Therapy Evaluation Patient Details Name: Brent Turner MRN: [id number] DOB: July 15, 1941 Today's Date: 04/21/2020   History of Present Illness  Pt is 79 yo male with PMH including HTN and gout.  He presented with L low sided back pain that radiated dow buttock to thigh and lateral calf. Pt admitted for L4-5, L5-S1 PLIF on 04/21/20.  Clinical Impression  Pt admitted with above diagnosis. Pt very pleasant and motivated gentleman.  He did very well on DOS.  Pt was able to ambulate 350' with RW and min guard for safety.  Pt with no complaints of pain.  He demonstrated transfers safely with back precautions but needed min cues with ambulation for no twisting.  Pt will have 24 hr support for a couple of weeks at home and is getting a hospital bed so that he can stay on there first floor.  Pt currently with functional limitations due to the deficits listed below (see PT Problem List). Pt will benefit from skilled PT to increase their independence and safety with mobility to allow discharge to the venue listed below.       Follow Up Recommendations No PT follow up    Equipment Recommendations  Rolling walker with 5" wheels;3in1 (PT);Other (comment) (pt already getting hospital bed for downstairs)    Recommendations for Other Services       Precautions / Restrictions Precautions Precautions: Back Precaution Booklet Issued: Yes (comment) Required Braces or Orthoses: Spinal Brace Spinal Brace: Lumbar corset;Applied in sitting position Restrictions Weight Bearing Restrictions: No      Mobility  Bed Mobility Overal bed mobility: Needs Assistance Bed Mobility: Rolling;Sidelying to Sit;Sit to Sidelying Rolling: Min guard Sidelying to sit: Min guard     Sit to sidelying: Min guard General bed mobility comments: Demonstrated log roll well with min cues; used bed rail; HOB only elevated to 10 degrees  Transfers Overall transfer level: Needs assistance Equipment used: Rolling walker (2  wheeled) Transfers: Sit to/from Stand Sit to Stand: Min guard         General transfer comment: min guard for safety; cues for handplacement  Ambulation/Gait Ambulation/Gait assistance: Min guard Gait Distance (Feet): 350 Feet Assistive device: Rolling walker (2 wheeled) Gait Pattern/deviations: Step-through pattern Gait velocity: decreased   General Gait Details: Steady gait, decreased speed, cues to not twist to look around while walking (pt trying to look out windows, in break room, etc)  Stairs            Wheelchair Mobility    Modified Rankin (Stroke Patients Only)       Balance Overall balance assessment: Needs assistance Sitting-balance support: No upper extremity supported Sitting balance-Leahy Scale: Good     Standing balance support: No upper extremity supported Standing balance-Leahy Scale: Fair Standing balance comment: Pt demonstrated static standing without use of RW; ambulated with RW                             Pertinent Vitals/Pain Pain Assessment: No/denies pain    Home Living Family/patient expects to be discharged to:: Private residence Living Arrangements: Spouse/significant other;Other relatives (Sister staying for a week, then children are staying another week, wife not able to assist) Available Help at Discharge: Family;Available 24 hours/day Type of Home: House Home Access: Level entry     Home Layout: Two level;1/2 bath on main level;Bed/bath upstairs;Other (Comment) Home Equipment: Crutches;Cane - single point      Prior Function Level of Independence:  Independent         Comments: Independent with ADLs, IADLs, and community ambulation     Hand Dominance        Extremity/Trunk Assessment   Upper Extremity Assessment Upper Extremity Assessment: Overall WFL for tasks assessed    Lower Extremity Assessment Lower Extremity Assessment: Overall WFL for tasks assessed    Cervical / Trunk  Assessment Cervical / Trunk Assessment: Normal  Communication   Communication: No difficulties  Cognition Arousal/Alertness: Awake/alert Behavior During Therapy: WFL for tasks assessed/performed Overall Cognitive Status: Within Functional Limits for tasks assessed                                 General Comments: Very pleasant and motivated      General Comments General comments (skin integrity, edema, etc.): Educated on back precautions and don/doffing brace    Exercises     Assessment/Plan    PT Assessment Patient needs continued PT services  PT Problem List Decreased strength;Decreased mobility;Decreased safety awareness;Decreased knowledge of precautions;Decreased activity tolerance;Decreased balance;Decreased knowledge of use of DME       PT Treatment Interventions DME instruction;Therapeutic activities;Gait training;Therapeutic exercise;Patient/family education;Stair training;Balance training;Functional mobility training    PT Goals (Current goals can be found in the Care Plan section)  Acute Rehab PT Goals Patient Stated Goal: return home PT Goal Formulation: With patient/family Time For Goal Achievement: 05/05/20 Potential to Achieve Goals: Good    Frequency Min 5X/week   Barriers to discharge        Co-evaluation               AM-PAC PT "6 Clicks" Mobility  Outcome Measure Help needed turning from your back to your side while in a flat bed without using bedrails?: None Help needed moving from lying on your back to sitting on the side of a flat bed without using bedrails?: None Help needed moving to and from a bed to a chair (including a wheelchair)?: None Help needed standing up from a chair using your arms (e.g., wheelchair or bedside chair)?: None Help needed to walk in hospital room?: None Help needed climbing 3-5 steps with a railing? : A Little 6 Click Score: 23    End of Session Equipment Utilized During Treatment: Back  brace Activity Tolerance: Patient tolerated treatment well Patient left: in bed;with call bell/phone within reach;with bed alarm set Nurse Communication: Mobility status;Other (comment) (pt request meds to help sleep tonight) PT Visit Diagnosis: Muscle weakness (generalized) (M62.81);Other abnormalities of gait and mobility (R26.89)    Time: 5176-1607 PT Time Calculation (min) (ACUTE ONLY): 26 min   Charges:   PT Evaluation $PT Eval Low Complexity: 1 Low PT Treatments $Therapeutic Activity: 8-22 mins        Anise Salvo, PT Acute Rehab Services Pager [phone] [facility] [phone]    Rayetta Humphrey 04/21/2020, 5:33 PM

## 2020-04-21 NOTE — Op Note (Signed)
04/21/2020  1:46 PM  PATIENT:  Brent Turner  79 y.o. male  PRE-OPERATIVE DIAGNOSIS:  Foraminal stenosis of lumbar region, lumbar spondylolisthesis, herniated lumbar disc, lumbar radiculopathy, left foot drop, lumbago  POST-OPERATIVE DIAGNOSIS:  Foraminal stenosis of lumbar region, lumbar spondylolisthesis, herniated lumbar disc, lumbar radiculopathy, left foot drop, lumbago  PROCEDURE:  Procedure(s): Lumbar four-five decompression, Lumbar five Sacral one Posterior lumbar interbody fusion with posterior instrumentation L 4 - S 1 levels with posterolateral arthrodesis with local autograft  SURGEON:  Surgeon(s) and Role:    Erline Levine, MD - Primary  PHYSICIAN ASSISTANT: Glenford Peers, NP  ASSISTANTS: Poteat, RN   ANESTHESIA:   general  EBL:  100 mL   BLOOD ADMINISTERED:none  DRAINS: (Medium) Hemovact drain(s) in the epidural space with  Suction Open   LOCAL MEDICATIONS USED:  MARCAINE    and LIDOCAINE   SPECIMEN:  No Specimen  DISPOSITION OF SPECIMEN:  N/A  COUNTS:  YES  TOURNIQUET:  * No tourniquets in log *  DICTATION: Patient is 79 year old man with  HNP, spondylosis, foraminal stenosis, DDD, radiculopathy L4/5, L5/S1. He has a severe left leg pain and weakness with a new onset foot drop. It was elected to take him to surgery for decompression and fusion at L4/5 and L5/S1 levels.  He has spondylolisthesis of L 4 on L 5.  Procedure: Patient was placed in a prone position on the Mott table after smooth and uncomplicated induction of general endotracheal anesthesia. His low back was prepped and draped in usual sterile fashion with betadine scrub and DuraPrep after marking spinal anatomy with C arm. Area of incision was infiltrated with local lidocaine. Incision was made to the lumbodorsal fascia was incised and exposure was performed of the L4/5, L5/S1 spinous processes laminae facet joint and transverse processes. Intraoperative x-ray was obtained which confirmed correct  orientation. A total laminectomy of L4 and L5 was performed with disarticulation of the facet joints at this level and thorough decompression was performed of both L4, L5 and S1 nerve roots along with the common dural tube. There was densely adherent spondylytic material compressing the thecal sac and both L4 and L5 nerve roots.  Decompression was greater than would be typically performed for simple interbody fusion. A thorough discectomy and preparation of the endplates was performed at the L5/S1 level.  The interspaces were packed with PEEK cages (10 x 9 x 23 mm x 12 degree at L5/S1) . 10 cc Bone autograft was packed within the interspace bilaterally along with extra extra small BMP kit. After a thorough decompression with bilateral discectomy at L4/5, adherent ligament and overgrown bone was removed to decompress the patient's nerve roots widely.   The posterolateral region was extensively decorticated and pedicle probes were placed at L4, L5 and S1 bilaterally. Intraoperative fluoroscopy confirmed correct orientationin the AP and lateral plane. 40 x 6.5 mm pedicle screws were placed at S1 bilaterally and 45 x 6.5 mm screws placed at L5 bilaterally and 45x 6.69m screws were placed at L4 bilaterally. Final x-rays demonstrated well-positioned interbody grafts and pedicle screw fixation.  600 mm lordotic rod was  placed on the right and 70 mm rod was placed on the left and locked down in situ and the posterolateral region was packed with 20 cc bone graft  on the right and a similar amount of autograft on the left. A medium Hemovac drain was placed and anchored with a stitch. Long-acting Marcaine was injected in the deep musculature. Fascia was closed with  1 Vicryl sutures skin edges were reapproximated 2 and 3-0 Vicryl sutures. The wound is dressed with Dermabond and an occlusive dressing. The patient was extubated in the operating room and taken to recovery in stable satisfactory condition having tolerated the  operation well. Counts were correct at the end of the case.    PLAN OF CARE: Admit to inpatient   PATIENT DISPOSITION:  PACU - hemodynamically stable.   Delay start of Pharmacological VTE agent (>24hrs) due to surgical blood loss or risk of bleeding: yes

## 2020-04-21 NOTE — Progress Notes (Signed)
    Durable Medical Equipment  (From admission, onward)         Start     Ordered   04/21/20 1647  For home use only DME Hospital bed  Once       Question Answer Comment  Length of Need 12 Months   The above medical condition requires: Patient requires the ability to reposition frequently   Bed type Semi-electric   Support Surface: Gel Overlay      04/21/20 1648

## 2020-04-21 NOTE — Transfer of Care (Signed)
Immediate Anesthesia Transfer of Care Note  Patient: Brent Turner  Procedure(s) Performed: Left Lumbar four-five decompression, Lumbar five Sacral one Posterior lumbar interbody fusion with instrumentation only at Lumbar five Sacral one (Left Back)  Patient Location: PACU  Anesthesia Type:General  Level of Consciousness: awake, alert  and oriented  Airway & Oxygen Therapy: Patient Spontanous Breathing and Patient connected to nasal cannula oxygen  Post-op Assessment: Report given to RN and Post -op Vital signs reviewed and stable  Post vital signs: Reviewed and stable  Last Vitals:  Vitals Value Taken Time  BP 123/111 04/21/20 1320  Temp 36.4 C 04/21/20 1320  Pulse 64 04/21/20 1326  Resp 16 04/21/20 1326  SpO2 100 % 04/21/20 1326  Vitals shown include unvalidated device data.  Last Pain:  Vitals:   04/21/20 1320  TempSrc:   PainSc: 0-No pain      Patients Stated Pain Goal: 5 (04/21/20 0715)  Complications: No complications documented.

## 2020-04-22 ENCOUNTER — Encounter (HOSPITAL_COMMUNITY): Payer: Self-pay | Admitting: Emergency Medicine

## 2020-04-22 ENCOUNTER — Emergency Department (HOSPITAL_COMMUNITY)
Admission: EM | Admit: 2020-04-22 | Discharge: 2020-04-22 | Disposition: A | Discharge status: Home / Self Care | Payer: Medicare Other | Source: Home / Self Care | Attending: Emergency Medicine | Admitting: Emergency Medicine

## 2020-04-22 ENCOUNTER — Other Ambulatory Visit: Payer: Self-pay

## 2020-04-22 DIAGNOSIS — I10 Essential (primary) hypertension: Secondary | ICD-10-CM | POA: Insufficient documentation

## 2020-04-22 DIAGNOSIS — R0902 Hypoxemia: Secondary | ICD-10-CM | POA: Diagnosis not present

## 2020-04-22 DIAGNOSIS — M48062 Spinal stenosis, lumbar region with neurogenic claudication: Secondary | ICD-10-CM | POA: Diagnosis not present

## 2020-04-22 DIAGNOSIS — F17200 Nicotine dependence, unspecified, uncomplicated: Secondary | ICD-10-CM | POA: Insufficient documentation

## 2020-04-22 DIAGNOSIS — R55 Syncope and collapse: Secondary | ICD-10-CM

## 2020-04-22 DIAGNOSIS — I959 Hypotension, unspecified: Secondary | ICD-10-CM | POA: Diagnosis not present

## 2020-04-22 DIAGNOSIS — Z79899 Other long term (current) drug therapy: Secondary | ICD-10-CM | POA: Insufficient documentation

## 2020-04-22 DIAGNOSIS — R61 Generalized hyperhidrosis: Secondary | ICD-10-CM | POA: Diagnosis not present

## 2020-04-22 DIAGNOSIS — M549 Dorsalgia, unspecified: Secondary | ICD-10-CM | POA: Insufficient documentation

## 2020-04-22 LAB — CBC WITH DIFFERENTIAL/PLATELET
Abs Immature Granulocytes: 0.12 10*3/uL — ABNORMAL HIGH (ref 0.00–0.07)
Basophils Absolute: 0 10*3/uL (ref 0.0–0.1)
Basophils Relative: 0 %
Eosinophils Absolute: 0 10*3/uL (ref 0.0–0.5)
Eosinophils Relative: 0 %
HCT: 35 % — ABNORMAL LOW (ref 39.0–52.0)
Hemoglobin: 11.7 g/dL — ABNORMAL LOW (ref 13.0–17.0)
Immature Granulocytes: 1 %
Lymphocytes Relative: 13 %
Lymphs Abs: 2.7 10*3/uL (ref 0.7–4.0)
MCH: 34.6 pg — ABNORMAL HIGH (ref 26.0–34.0)
MCHC: 33.4 g/dL (ref 30.0–36.0)
MCV: 103.6 fL — ABNORMAL HIGH (ref 80.0–100.0)
Monocytes Absolute: 1.2 10*3/uL — ABNORMAL HIGH (ref 0.1–1.0)
Monocytes Relative: 6 %
Neutro Abs: 16.4 10*3/uL — ABNORMAL HIGH (ref 1.7–7.7)
Neutrophils Relative %: 80 %
Platelets: 219 10*3/uL (ref 150–400)
RBC: 3.38 MIL/uL — ABNORMAL LOW (ref 4.22–5.81)
RDW: 15.8 % — ABNORMAL HIGH (ref 11.5–15.5)
WBC: 20.5 10*3/uL — ABNORMAL HIGH (ref 4.0–10.5)
nRBC: 0 % (ref 0.0–0.2)

## 2020-04-22 LAB — BASIC METABOLIC PANEL
Anion gap: 9 (ref 5–15)
BUN: 13 mg/dL (ref 8–23)
CO2: 25 mmol/L (ref 22–32)
Calcium: 8.1 mg/dL — ABNORMAL LOW (ref 8.9–10.3)
Chloride: 105 mmol/L (ref 98–111)
Creatinine, Ser: 0.99 mg/dL (ref 0.61–1.24)
GFR calc Af Amer: >60 mL/min (ref >60)
GFR calc non Af Amer: >60 mL/min (ref >60)
Glucose, Bld: 177 mg/dL — ABNORMAL HIGH (ref 70–99)
Potassium: 3.6 mmol/L (ref 3.5–5.1)
Sodium: 139 mmol/L (ref 135–145)

## 2020-04-22 LAB — CBG MONITORING, ED: Glucose-Capillary: 160 mg/dL — ABNORMAL HIGH (ref 70–99)

## 2020-04-22 MED ORDER — METHOCARBAMOL 500 MG PO TABS: 500.0000 mg | ORAL_TABLET | Freq: Four times a day (QID) | ORAL | 1 refills | Status: DC | PRN

## 2020-04-22 MED ORDER — HYDROCODONE-ACETAMINOPHEN 5-325 MG PO TABS
2.0000 | ORAL_TABLET | ORAL | 0 refills | Status: DC | PRN
Start: 2020-04-22 — End: 2020-12-28

## 2020-04-22 MED ORDER — SODIUM CHLORIDE 0.9 % IV BOLUS
1000.0000 mL | Freq: Once | INTRAVENOUS | Status: AC
  Administered 2020-04-22: 1000 mL via INTRAVENOUS

## 2020-04-22 MED ORDER — DOCUSATE SODIUM 100 MG PO CAPS
100.0000 mg | ORAL_CAPSULE | Freq: Two times a day (BID) | ORAL | 0 refills | Status: DC
Start: 2020-04-22 — End: 2020-12-28

## 2020-04-22 NOTE — ED Notes (Signed)
Patient ambulated well into the hallway. Ambulation witnessed by MD.

## 2020-04-22 NOTE — Evaluation (Signed)
Occupational Therapy Evaluation Patient Details Name: Brent Turner MRN: [id number] DOB: 08/29/1941 Today's Date: 04/22/2020    History of Present Illness Pt is 79 yo male with PMH including HTN and gout.  He presented with L low sided back pain that radiated dow buttock to thigh and lateral calf. Pt admitted for L4-5, L5-S1 PLIF on 04/21/20.   Clinical Impression   Patient is s/p see above surgery resulting in the deficits listed below (see OT Problem List). Pt will have family support with the return to home. Per pt they they are going to have a hospital bed delivered today to live on the main level of the home for awhile until they feel ready. Pt was able to complete dressing with modified independence. Pt educated bout use of shower seat and long handle sponge for showers. Pt occasionally needed cues in session to follow back precautions and recommended to have a long handle reacher.      Follow Up Recommendations  Supervision/Assistance - 24 hour;No OT follow up    Equipment Recommendations  3 in 1 bedside commode    Recommendations for Other Services       Precautions / Restrictions Precautions Precautions: Back Precaution Booklet Issued: Yes (comment) Required Braces or Orthoses: Spinal Brace Spinal Brace: Lumbar corset;Applied in sitting position Restrictions Weight Bearing Restrictions: No      Mobility Bed Mobility Overal bed mobility: Needs Assistance Bed Mobility: Rolling;Sidelying to Sit;Sit to Sidelying;Supine to Sit Rolling: Min guard Sidelying to sit: Min guard Supine to sit: Min guard   Sit to sidelying: Min guard General bed mobility comments: pt OOB in recliner at start and end of session  Transfers Overall transfer level: Needs assistance Equipment used: Rolling walker (2 wheeled) Transfers: Sit to/from Stand Sit to Stand: Supervision         General transfer comment: supervision for safety    Balance Overall balance assessment: Needs  assistance Sitting-balance support: No upper extremity supported Sitting balance-Leahy Scale: Good     Standing balance support: Bilateral upper extremity supported;During functional activity Standing balance-Leahy Scale: Fair Standing balance comment: Pt benefits from BUE for ambulation, single UE support for stairs                           ADL either performed or assessed with clinical judgement   ADL Overall ADL's : Needs assistance/impaired Eating/Feeding: Independent   Grooming: Wash/dry hands;Wash/dry face;Modified independent;Standing   Upper Body Bathing: Modified independent;Sitting   Lower Body Bathing: Modified independent;Sit to/from stand   Upper Body Dressing : Modified independent;Sitting;Standing   Lower Body Dressing: Modified independent;Sit to/from stand   Toilet Transfer: Modified Tour manager and Hygiene: Modified independent;Sit to/from stand   Tub/ Shower Transfer: Supervision/safety;Cueing for safety;Cueing for sequencing;Shower seat   Functional mobility during ADLs: Supervision/safety;Rolling walker       Vision         Perception Perception Perception Tested?: No   Praxis      Pertinent Vitals/Pain Pain Assessment: 0-10 Pain Score: 1  Faces Pain Scale: Hurts a little bit Pain Location: low back Pain Descriptors / Indicators: Grimacing;Sore Pain Intervention(s): Limited activity within patient's tolerance     Hand Dominance     Extremity/Trunk Assessment Upper Extremity Assessment Upper Extremity Assessment: Overall WFL for tasks assessed   Lower Extremity Assessment Lower Extremity Assessment: Defer to PT evaluation   Cervical / Trunk Assessment Cervical / Trunk Assessment: Normal  Communication Communication Communication: No difficulties   Cognition Arousal/Alertness: Awake/alert Behavior During Therapy: WFL for tasks assessed/performed Overall Cognitive  Status: Within Functional Limits for tasks assessed                                 General Comments: Very pleasant and motivated   General Comments  educated on back precautions, no questions    Exercises     Shoulder Instructions      Home Living Family/patient expects to be discharged to:: Private residence Living Arrangements: Spouse/significant other;Other relatives Available Help at Discharge: Family;Available 24 hours/day Type of Home: House Home Access: Level entry     Home Layout: Two level;1/2 bath on main level;Bed/bath upstairs;Other (Comment) Alternate Level Stairs-Number of Steps: RN and pt's sister calling DME company - report that office set up hospital bed prior to admission so that pt can stay on first floor - reports will be delivered today   Bathroom Shower/Tub: Chief Strategy Officer: Standard Bathroom Accessibility: Yes How Accessible: Accessible via walker Home Equipment: Crutches;Cane - single point          Prior Functioning/Environment Level of Independence: Independent        Comments: Independent with ADLs, IADLs, and community ambulation        OT Problem List: Decreased strength;Impaired balance (sitting and/or standing);Decreased knowledge of use of DME or AE;Decreased knowledge of precautions;Pain      OT Treatment/Interventions:      OT Goals(Current goals can be found in the care plan section) Acute Rehab OT Goals Patient Stated Goal: return home OT Goal Formulation: With patient Time For Goal Achievement: 04/29/20 Potential to Achieve Goals: Good  OT Frequency:     Barriers to D/C:            Co-evaluation              AM-PAC OT "6 Clicks" Daily Activity     Outcome Measure Help from another person eating meals?: None Help from another person taking care of personal grooming?: None Help from another person toileting, which includes using toliet, bedpan, or urinal?: None Help from  another person bathing (including washing, rinsing, drying)?: A Little Help from another person to put on and taking off regular upper body clothing?: None Help from another person to put on and taking off regular lower body clothing?: None 6 Click Score: 23   End of Session Equipment Utilized During Treatment: Gait belt;Rolling walker;Back brace  Activity Tolerance: Patient tolerated treatment well Patient left: with call bell/phone within reach;in chair  OT Visit Diagnosis: Unsteadiness on feet (R26.81);Other abnormalities of gait and mobility (R26.89);Pain Pain - part of body:  (back)                Time: 1751-0258 OT Time Calculation (min): 20 min Charges:  OT General Charges $OT Visit: 1 Visit  Alphia Moh OTR/L  Acute Rehab Services  [phone] office number [phone] pager number   Alphia Moh 04/22/2020, 11:06 AM

## 2020-04-22 NOTE — ED Provider Notes (Signed)
[facility]-EMERGENCY DEPT Provider Note   CSN: [id number] Arrival date & time: 04/22/20  1453     History Chief Complaint  Patient presents with  . Loss of Consciousness    Brent Turner is a 79 y.o. male.  79 yo M with a chief complaint of a syncopal event.  The patient just had back surgery yesterday went home this morning he had breakfast as normal this morning and then he said his dog was jumping on him and so he wanted to get some pressure on the front porch.  Was able to ambulate to the front porch and after he had sat there for a while started feeling a bit lightheaded.  He is able to get up and open the front door and let his family member note he felt out and then he woke up on the ground.  Initial blood pressure per EMS was in the eighties.  Patient now feels normal.  He denies chest pain or shortness of breath denies headache or neck pain.  States that his back is sore post surgery but nothing terrible.  He denies fevers denies cough or congestion denies decreased oral intake.  The history is provided by the patient.  Loss of Consciousness Episode history:  Multiple Most recent episode:  Today Duration:  2 minutes Timing:  Rare Progression:  Resolved Chronicity:  New Witnessed: no   Relieved by:  Lying down Worsened by:  Nothing Ineffective treatments:  None tried Associated symptoms: no chest pain, no confusion, no fever, no headaches, no palpitations, no shortness of breath and no vomiting        Past Medical History:  Diagnosis Date  . ED (erectile dysfunction)   . Gout   . HTN (hypertension)   . Vitamin D deficiency     Patient Active Problem List   Diagnosis Date Noted  . Spondylolisthesis of lumbar region 04/21/2020  . Sciatica of left side associated with disorder of lumbar spine 03/08/2020  . Chronic left-sided low back pain with left-sided sciatica 03/08/2020  . Sciatic leg pain 02/17/2020  . Elevated PSA 02/17/2020  .  Dyslipidemia 10/29/2018  . Weight loss 04/27/2018  . Blurred vision 05/29/2016  . Left carpal tunnel syndrome 04/07/2016  . Arthritis 04/07/2016  . Right hip pain 01/12/2014  . Bladder neck obstruction 07/13/2012  . Well adult exam 05/14/2011  . COUGH 02/08/2010  . Essential hypertension 08/15/2008  . Vitamin D deficiency 01/29/2008  . TOBACCO USE DISORDER/SMOKER-SMOKING CESSATION DISCUSSED 01/29/2008  . ERECTILE DYSFUNCTION 01/29/2008  . RASH AND OTHER NONSPECIFIC SKIN ERUPTION 01/29/2008  . Gout 05/08/2007    Past Surgical History:  Procedure Laterality Date  . COLONOSCOPY    . EYE SURGERY Bilateral    cataracts removed       Family History  Problem Relation Age of Onset  . Stroke Mother 74  . Cancer Father 24       lung    Social History   Tobacco Use  . Smoking status: Current Every Day Smoker    Packs/day: 1.00    Years: 50.00    Pack years: 50.00  . Smokeless tobacco: Never Used  Vaping Use  . Vaping Use: Never used  Substance Use Topics  . Alcohol use: Yes    Alcohol/week: 7.0 standard drinks    Types: 7 Shots of liquor per week    Comment: occasional wine  . Drug use: No    Home Medications Prior to Admission medications  Medication Sig Start Date End Date Taking? Authorizing Provider  allopurinol (ZYLOPRIM) 300 MG tablet Take 1 tablet (300 mg total) by mouth daily. 02/17/20   [name], Georgina Quint, MD  amLODipine (NORVASC) 5 MG tablet Take 1 tablet (5 mg total) by mouth daily. 02/17/20   [name], Georgina Quint, MD  Cholecalciferol 1000 UNITS tablet Take 1,000 Units by mouth daily.      [provider]  docusate sodium (COLACE) 100 MG capsule Take 1 capsule (100 mg total) by mouth 2 (two) times daily. 04/22/20   Dawley, Troy C, DO  HYDROcodone-acetaminophen (NORCO/VICODIN) 5-325 MG tablet Take 2 tablets by mouth every 4 (four) hours as needed for severe pain ((score 7 to 10)). 04/22/20   Dawley, Troy C, DO  methocarbamol (ROBAXIN) 500 MG tablet  Take 1 tablet (500 mg total) by mouth every 6 (six) hours as needed for muscle spasms. 04/22/20   Dawley, Troy C, DO  predniSONE (DELTASONE) 10 MG tablet TAKE 2 TABLETS BY MOUTH DAILY FOR 2-3 DAYS AS NEEDED FOR GOUT Patient taking differently: Take 10 mg by mouth daily as needed (Gout).  03/13/20   [name], Georgina Quint, MD  tamsulosin (FLOMAX) 0.4 MG CAPS capsule Take 1 capsule (0.4 mg total) by mouth daily. 02/17/20   [name], Georgina Quint, MD  triamcinolone cream (KENALOG) 0.5 % Apply 1 application topically 2 (two) times daily. Patient taking differently: Apply 1 application topically daily as needed (Rash).  02/17/20   [name], Georgina Quint, MD    Allergies    Tetanus toxoids  Review of Systems   Review of Systems  Constitutional: Negative for chills and fever.  HENT: Negative for congestion and facial swelling.   Eyes: Negative for discharge and visual disturbance.  Respiratory: Negative for shortness of breath.   Cardiovascular: Positive for syncope. Negative for chest pain and palpitations.  Gastrointestinal: Negative for abdominal pain, diarrhea and vomiting.  Musculoskeletal: Positive for back pain. Negative for arthralgias and myalgias.  Skin: Negative for color change and rash.  Neurological: Positive for syncope. Negative for tremors and headaches.  Psychiatric/Behavioral: Negative for confusion and dysphoric mood.    Physical Exam Updated Vital Signs BP (!) 141/85   Pulse (!) 102   Temp 98.4 F (36.9 C) (Oral)   Resp 16   SpO2 100%   Physical Exam Vitals and nursing note reviewed.  Constitutional:      Appearance: He is well-developed.  HENT:     Head: Normocephalic and atraumatic.  Eyes:     Pupils: Pupils are equal, round, and reactive to light.  Neck:     Vascular: No JVD.  Cardiovascular:     Rate and Rhythm: Normal rate and regular rhythm.     Heart sounds: No murmur heard.  No friction rub. No gallop.   Pulmonary:     Effort: No respiratory distress.      Breath sounds: No wheezing.  Abdominal:     General: There is no distension.     Tenderness: There is no abdominal tenderness. There is no guarding or rebound.  Musculoskeletal:        General: Normal range of motion.     Cervical back: Normal range of motion and neck supple.  Skin:    Coloration: Skin is not pale.     Findings: No rash.     Comments: Incision intact with trace oozing of blood to the superior aspect.  No significant erythema or warmth.   Neurological:     Mental Status: He  is alert and oriented to person, place, and time.  Psychiatric:        Behavior: Behavior normal.     ED Results / Procedures / Treatments   Labs (all labs ordered are listed, but only abnormal results are displayed) Labs Reviewed  CBC WITH DIFFERENTIAL/PLATELET - Abnormal; Notable for the following components:      Result Value   WBC 20.5 (*)    RBC 3.38 (*)    Hemoglobin 11.7 (*)    HCT 35.0 (*)    MCV 103.6 (*)    MCH 34.6 (*)    RDW 15.8 (*)    Neutro Abs 16.4 (*)    Monocytes Absolute 1.2 (*)    Abs Immature Granulocytes 0.12 (*)    All other components within normal limits  BASIC METABOLIC PANEL - Abnormal; Notable for the following components:   Glucose, Bld 177 (*)    Calcium 8.1 (*)    All other components within normal limits  CBG MONITORING, ED - Abnormal; Notable for the following components:   Glucose-Capillary 160 (*)    All other components within normal limits    EKG EKG Interpretation  Date/Time:  Saturday April 22 2020 15:35:05 EDT Ventricular Rate:  81 PR Interval:    QRS Duration: 97 QT Interval:  403 QTC Calculation: 468 R Axis:   89 Text Interpretation: Sinus rhythm Borderline right axis deviation 12 Lead; Mason-Likar no wpw, prolonged qt or brugaday, No significant change since last tracing Confirmed by Melene PlanFloyd, Analysa Nutting [id number]) on 04/22/2020 3:47:16 PM   Radiology DG Lumbar Spine 2-3 Views  Result Date: 04/21/2020 CLINICAL DATA:  79 year old male  undergoing lumbar surgery. EXAM: LUMBAR SPINE - 2-3 VIEW; DG C-ARM 1-60 MIN COMPARISON:  Lumbar radiographs 03/08/2020.  Lumbar MRI 03/30/2020. FINDINGS: Three intraoperative fluoroscopic spot views of the lower lumbar spine. Normal lumbar segmentation, same numbering system is used on the prior MRI. These images demonstrate bilateral pedicle screw placement L4 through S1, with interbody implant at L5-S1. Posterior connecting rods are not yet in place. FLUOROSCOPY TIME:  0 minutes 54 seconds IMPRESSION: L4 through S1 posterior and/or interbody fusion depicted. Electronically Signed   By: Odessa FlemingH  Hall M.D.   On: 04/21/2020 12:52   DG C-Arm 1-60 Min  Result Date: 04/21/2020 CLINICAL DATA:  79 year old male undergoing lumbar surgery. EXAM: LUMBAR SPINE - 2-3 VIEW; DG C-ARM 1-60 MIN COMPARISON:  Lumbar radiographs 03/08/2020.  Lumbar MRI 03/30/2020. FINDINGS: Three intraoperative fluoroscopic spot views of the lower lumbar spine. Normal lumbar segmentation, same numbering system is used on the prior MRI. These images demonstrate bilateral pedicle screw placement L4 through S1, with interbody implant at L5-S1. Posterior connecting rods are not yet in place. FLUOROSCOPY TIME:  0 minutes 54 seconds IMPRESSION: L4 through S1 posterior and/or interbody fusion depicted. Electronically Signed   By: Odessa FlemingH  Hall M.D.   On: 04/21/2020 12:52    Procedures Procedures (including critical care time)  Medications Ordered in ED Medications  sodium chloride 0.9 % bolus 1,000 mL (0 mLs Intravenous Stopped 04/22/20 1800)    ED Course  I have reviewed the triage vital signs and the nursing notes.  Pertinent labs & imaging results that were available during my care of the patient were reviewed by me and considered in my medical decision making (see chart for details).    MDM Rules/Calculators/A&P  79 yo M with a chief complaint of a syncopal event.  Sounds vasovagal by history.  He is well-appearing and  nontoxic.  No history of heart failure not exertional no chest pain or shortness of breath no headache or neck pain.  Will obtain a laboratory evaluation and EKG bolus of fluids observe in the ED.  Leukocytosis.  2g drop in hgb from a couple days ago.  Seems unlikely to have been lost from lumbar surgery.  Discussed with patient, not endorsing infectious symptoms, denies dark stool, blood in stool.  Will ambulate.  Patient is able to ambulate independently without issue.  Requesting discharge home.  We will have him call his neurosurgeon and family doctor on Monday.  6:05 PM:  I have discussed the diagnosis/risks/treatment options with the patient and family and believe the pt to be eligible for discharge home to follow-up with PCP, neurosurgery. We also discussed returning to the ED immediately if new or worsening sx occur. We discussed the sx which are most concerning (e.g., sudden worsening pain, fever, inability to tolerate by mouth, syncope or near syncope, dark stool or blood in his stool) that necessitate immediate return. Medications administered to the patient during their visit and any new prescriptions provided to the patient are listed below.  Medications given during this visit Medications  sodium chloride 0.9 % bolus 1,000 mL (0 mLs Intravenous Stopped 04/22/20 1800)     The patient appears reasonably screen and/or stabilized for discharge and I doubt any other medical condition or other [facility] requiring further screening, evaluation, or treatment in the ED at this time prior to discharge.    Final Clinical Impression(s) / ED Diagnoses Final diagnoses:  Syncope and collapse    Rx / DC Orders ED Discharge Orders    None       Melene Plan, DO 04/22/20 1805

## 2020-04-22 NOTE — Care Management CC44 (Signed)
Condition Code 44 Documentation Completed  Patient Details  Name: Brent Turner MRN: [id number] Date of Birth: [birth year]   Condition Code 44 given:  Yes Patient signature on Condition Code 44 notice:  Yes Documentation of 2 MD's agreement:  Yes Code 44 added to claim:  Yes    Deveron Furlong, RN 04/22/2020, 10:39 AM

## 2020-04-22 NOTE — Progress Notes (Signed)
Physical Therapy Treatment Patient Details Name: Brent Turner MRN: [id number] DOB: December 11, 1940 Today's Date: 04/22/2020    History of Present Illness Pt is 79 yo male with PMH including HTN and gout.  He presented with L low sided back pain that radiated dow buttock to thigh and lateral calf. Pt admitted for L4-5, L5-S1 PLIF on 04/21/20.    PT Comments    The pt demos good continued progress with mobility today following surgery. He was able to complete hallway ambulation without LOB or instability and completed navigation of 10 steps with use of one rail and no physical assist. The pt was educated on safety with mobility in the home and he expressed good understanding of the education. The pt will continue to benefit from skilled PT to further progress functional activity tolerance and strength.     Follow Up Recommendations  No PT follow up     Equipment Recommendations  Rolling walker with 5" wheels;3in1 (PT);Other (comment) (pt getting hospital bed for downstairs)    Recommendations for Other Services       Precautions / Restrictions Precautions Precautions: Back Precaution Booklet Issued: Yes (comment) Required Braces or Orthoses: Spinal Brace Spinal Brace: Lumbar corset;Applied in sitting position Restrictions Weight Bearing Restrictions: No    Mobility  Bed Mobility               General bed mobility comments: pt OOB in recliner at start and end of session  Transfers Overall transfer level: Needs assistance Equipment used: Rolling walker (2 wheeled) Transfers: Sit to/from Stand Sit to Stand: Supervision         General transfer comment: supervision for safety  Ambulation/Gait Ambulation/Gait assistance: Supervision Gait Distance (Feet): 350 Feet Assistive device: Rolling walker (2 wheeled) Gait Pattern/deviations: Step-through pattern   Gait velocity interpretation: <1.8 ft/sec, indicate of risk for recurrent falls General Gait Details: Steady gait,  decreased speed, cues to not twist to look around while walking   Stairs Stairs: Yes Stairs assistance: Supervision Stair Management: One rail Left;Step to pattern;Forwards Number of Stairs: 10 General stair comments: Pt completed 2 steps x2 then 10 steps with single UE support   Wheelchair Mobility    Modified Rankin (Stroke Patients Only)       Balance Overall balance assessment: Needs assistance Sitting-balance support: No upper extremity supported Sitting balance-Leahy Scale: Good     Standing balance support: Bilateral upper extremity supported;During functional activity Standing balance-Leahy Scale: Fair Standing balance comment: Pt benefits from BUE for ambulation, single UE support for stairs                            Cognition Arousal/Alertness: Awake/alert Behavior During Therapy: WFL for tasks assessed/performed Overall Cognitive Status: Within Functional Limits for tasks assessed                                 General Comments: Very pleasant and motivated      Exercises      General Comments General comments (skin integrity, edema, etc.): educated on back precautions, no questions      Pertinent Vitals/Pain Pain Assessment: Faces Faces Pain Scale: Hurts a little bit Pain Location: low back Pain Descriptors / Indicators: Grimacing;Sore Pain Intervention(s): Limited activity within patient's tolerance;Monitored during session;Repositioned    Home Living  Prior Function            PT Goals (current goals can now be found in the care plan section) Acute Rehab PT Goals Patient Stated Goal: return home PT Goal Formulation: With patient/family Time For Goal Achievement: 05/05/20 Potential to Achieve Goals: Good Progress towards PT goals: Progressing toward goals    Frequency    Min 5X/week      PT Plan Current plan remains appropriate    Co-evaluation              AM-PAC  PT "6 Clicks" Mobility   Outcome Measure  Help needed turning from your back to your side while in a flat bed without using bedrails?: None Help needed moving from lying on your back to sitting on the side of a flat bed without using bedrails?: None Help needed moving to and from a bed to a chair (including a wheelchair)?: None Help needed standing up from a chair using your arms (e.g., wheelchair or bedside chair)?: None Help needed to walk in hospital room?: None Help needed climbing 3-5 steps with a railing? : A Little 6 Click Score: 23    End of Session Equipment Utilized During Treatment: Back brace Activity Tolerance: Patient tolerated treatment well Patient left: with call bell/phone within reach;in chair Nurse Communication: Mobility status PT Visit Diagnosis: Muscle weakness (generalized) (M62.81);Other abnormalities of gait and mobility (R26.89)     Time: 5009-3818 PT Time Calculation (min) (ACUTE ONLY): 12 min  Charges:  $Gait Training: 8-22 mins                     Rolm Baptise, PT, DPT   Acute Rehabilitation Department Pager #: [phone]    Gaetana Michaelis 04/22/2020, 10:32 AM

## 2020-04-22 NOTE — Care Management (Signed)
Patient is to receive hospital bed from [facility] today.  This was arranged PTA by MD office.

## 2020-04-22 NOTE — Discharge Summary (Signed)
Physician Discharge Summary  Patient ID: Brent Turner MRN: [id number] DOB/AGE: 1941/07/13 79 y.o.  Admit date: 04/21/2020 Discharge date: 04/22/2020  Admission Diagnoses:  Foraminal stenosis of lumbar region, lumbar spondylolisthesis, herniated lumbar disc, lumbar radiculopathy, left foot drop, lumbago  Discharge Diagnoses:  Same Active Problems:   Spondylolisthesis of lumbar region   Discharged Condition: Stable  Hospital Course:  Brent Turner is a 79 y.o. male who underwent the below surgery and tolerated it well. He was at his neurologic baseline at DC. He was having normal bowel and bladder function and his pain was controlled on oral medication. PT Eval agreed with dc home. His wound was c/d/i  Treatments: Surgery - Lumbar four-five decompression, Lumbar five Sacral one Posterior lumbar interbody fusion with posterior instrumentation L 4 - S 1 levels with posterolateral arthrodesis with local autograft  Discharge Exam: Blood pressure 123/67, pulse 77, temperature 97.8 F (36.6 C), resp. rate 18, height 6' (1.829 m), weight 61.2 kg, SpO2 98 %. Awake, alert, oriented Speech fluent, appropriate CNs grossly intact Full strength BUE/BLE Wound c/d/i  Disposition: Discharge disposition: 01-Home or Self Care        Allergies as of 04/22/2020      Reactions   Tetanus Toxoids    States he had "bad reaction and declines another one"       Medication List    STOP taking these medications   Gralise 300 MG Tabs Generic drug: Gabapentin (Once-Daily)   Gralise 600 MG Tabs Generic drug: Gabapentin (Once-Daily)   Mitigare 0.6 MG Caps Generic drug: Colchicine   nabumetone 500 MG tablet Commonly known as: RELAFEN   tadalafil 5 MG tablet Commonly known as: Cialis   traMADol 50 MG tablet Commonly known as: ULTRAM     TAKE these medications   allopurinol 300 MG tablet Commonly known as: ZYLOPRIM Take 1 tablet (300 mg total) by mouth daily.   amLODipine 5 MG  tablet Commonly known as: NORVASC Take 1 tablet (5 mg total) by mouth daily.   Cholecalciferol 25 MCG (1000 UT) tablet Take 1,000 Units by mouth daily.   docusate sodium 100 MG capsule Commonly known as: COLACE Take 1 capsule (100 mg total) by mouth 2 (two) times daily.   HYDROcodone-acetaminophen 5-325 MG tablet Commonly known as: NORCO/VICODIN Take 2 tablets by mouth every 4 (four) hours as needed for severe pain ((score 7 to 10)). What changed:   how much to take  when to take this  reasons to take this   methocarbamol 500 MG tablet Commonly known as: ROBAXIN Take 1 tablet (500 mg total) by mouth every 6 (six) hours as needed for muscle spasms.   predniSONE 10 MG tablet Commonly known as: DELTASONE TAKE 2 TABLETS BY MOUTH DAILY FOR 2-3 DAYS AS NEEDED FOR GOUT What changed: See the new instructions.   tamsulosin 0.4 MG Caps capsule Commonly known as: FLOMAX Take 1 capsule (0.4 mg total) by mouth daily.   triamcinolone cream 0.5 % Commonly known as: KENALOG Apply 1 application topically 2 (two) times daily. What changed:   when to take this  reasons to take this            Durable Medical Equipment  (From admission, onward)         Start     Ordered   04/21/20 1647  For home use only DME Hospital bed  Once       Question Answer Comment  Length of Need 12 Months  The above medical condition requires: Patient requires the ability to reposition frequently   Bed type Semi-electric   Support Surface: Gel Overlay      04/21/20 1648          Follow-up Information    Maeola Harman, MD Follow up in 2 week(s).   Specialty: Neurosurgery Why: call for followup appointment Contact information: [address] [place] [address] [phone]               Signed: Fairley Copher Yasuo Phimmasone 04/22/2020, 9:52 AM

## 2020-04-22 NOTE — Discharge Instructions (Signed)
Wound Care REMOVE OUTSIDE DRESSING IN 2 DAYS Leave incision open to air. You may shower. Do not scrub directly on incision.  Do not put any creams, lotions, or ointments on incision. Activity Walk each and every day, increasing distance each day. No lifting greater than 5 lbs.  Avoid bending, Lifting, and twisting. No driving for 2 weeks; may ride as a passenger locally. If provided with back brace, wear when out of bed.  It is not necessary to wear in bed. Diet Resume your normal diet.  Return to Work Will be discussed at you follow up appointment. Call Your Doctor If Any of These Occur Redness, drainage, or swelling at the wound.  Temperature greater than 101 degrees. Severe pain not relieved by pain medication. Incision starts to come apart. Follow Up Appt Call today for appointment in 2-3 weeks ([phone]) or for problems.  If you have any hardware placed in your spine, you will need an x-ray before your appointment.

## 2020-04-22 NOTE — Plan of Care (Signed)
Patient alert and oriented, mae's well, voiding adequate amount of urine, swallowing without difficulty, no c/o pain at time of discharge. Patient discharged home with family. Script and discharged instructions given to patient. Patient and family stated understanding of instructions given. Patient has an appointment with Dr. Venetia Maxon in 2 weeks

## 2020-04-22 NOTE — Care Management Obs Status (Signed)
MEDICARE OBSERVATION STATUS NOTIFICATION   Patient Details  Name: Brent Turner MRN: [id number] Date of Birth: 1941-08-02   Medicare Observation Status Notification Given:  Yes    Deveron Furlong, RN 04/22/2020, 10:39 AM

## 2020-04-22 NOTE — ED Triage Notes (Signed)
The patient presents from home post discharge from [facility] at noon. Patient had back surgery. Once he got home he had a syncopal episode that lasted 1 min. Later when fire arrived he had another syncopal that lasted 30 seconds. At that time BP 80/50 and patient had a thready pulse. Patient is A&O x 4. Patient had 2 Vicodin at 1200 and 1 Robaxin at 1300. Patient has a back brace. NS administered by EMS.     EMS vitals: 153/80 BP 84 HR 20 Resp Rate 100% SPO2 room air  184 CBG

## 2020-04-22 NOTE — Discharge Instructions (Signed)
Eat and drink as well as you can for the next couple of days.  Return for repeat event, feeling lightheaded or if you notice dark stool or blood in your stool.  Also return for worsening back pain, numbness or weakness to your legs or if you develop a fever.  Call your neurosurgeon and family doc on Monday and let them know how you are doing.

## 2020-04-24 NOTE — Anesthesia Postprocedure Evaluation (Signed)
Anesthesia Post Note  Patient: Brent Turner  Procedure(s) Performed: Left Lumbar four-five decompression, Lumbar five Sacral one Posterior lumbar interbody fusion with instrumentation only at Lumbar five Sacral one (Left Back)     Patient location during evaluation: PACU Anesthesia Type: General Level of consciousness: awake and alert Pain management: pain level controlled Vital Signs Assessment: post-procedure vital signs reviewed and stable Respiratory status: spontaneous breathing, nonlabored ventilation, respiratory function stable and patient connected to nasal cannula oxygen Cardiovascular status: blood pressure returned to baseline and stable Postop Assessment: no apparent nausea or vomiting Anesthetic complications: no   No complications documented.  Last Vitals:  Vitals:   04/22/20 0343 04/22/20 0745  BP: 135/78 123/67  Pulse: 68 77  Resp: 18 18  Temp: 36.7 C 36.6 C  SpO2: 100% 98%    Last Pain:  Vitals:   04/22/20 1144  TempSrc:   PainSc: 3                  Kennieth Rad

## 2020-04-26 MED FILL — Sodium Chloride IV Soln 0.9%: INTRAVENOUS | Qty: 1000 | Status: AC

## 2020-04-26 MED FILL — Heparin Sodium (Porcine) Inj 1000 Unit/ML: INTRAMUSCULAR | Qty: 30 | Status: AC

## 2020-05-18 ENCOUNTER — Ambulatory Visit: Payer: Medicare Other | Admitting: Internal Medicine

## 2020-05-18 DIAGNOSIS — Z0289 Encounter for other administrative examinations: Secondary | ICD-10-CM

## 2020-05-31 DIAGNOSIS — M4316 Spondylolisthesis, lumbar region: Secondary | ICD-10-CM | POA: Diagnosis not present

## 2020-05-31 DIAGNOSIS — M48061 Spinal stenosis, lumbar region without neurogenic claudication: Secondary | ICD-10-CM | POA: Diagnosis not present

## 2020-05-31 DIAGNOSIS — M479 Spondylosis, unspecified: Secondary | ICD-10-CM | POA: Diagnosis not present

## 2020-05-31 DIAGNOSIS — M48062 Spinal stenosis, lumbar region with neurogenic claudication: Secondary | ICD-10-CM | POA: Diagnosis not present

## 2020-07-12 DIAGNOSIS — Z681 Body mass index (BMI) 19 or less, adult: Secondary | ICD-10-CM | POA: Diagnosis not present

## 2020-07-12 DIAGNOSIS — M4316 Spondylolisthesis, lumbar region: Secondary | ICD-10-CM | POA: Diagnosis not present

## 2020-07-12 DIAGNOSIS — I10 Essential (primary) hypertension: Secondary | ICD-10-CM | POA: Diagnosis not present

## 2020-08-29 DIAGNOSIS — Z23 Encounter for immunization: Secondary | ICD-10-CM | POA: Diagnosis not present

## 2020-12-28 ENCOUNTER — Ambulatory Visit (INDEPENDENT_AMBULATORY_CARE_PROVIDER_SITE_OTHER): Payer: Medicare Other | Admitting: Internal Medicine

## 2020-12-28 ENCOUNTER — Encounter: Payer: Self-pay | Admitting: Internal Medicine

## 2020-12-28 ENCOUNTER — Other Ambulatory Visit: Payer: Self-pay

## 2020-12-28 VITALS — BP 138/78 | HR 97 | Temp 97.8°F | Ht 72.0 in | Wt 137.4 lb

## 2020-12-28 DIAGNOSIS — M5386 Other specified dorsopathies, lumbar region: Secondary | ICD-10-CM

## 2020-12-28 DIAGNOSIS — E559 Vitamin D deficiency, unspecified: Secondary | ICD-10-CM

## 2020-12-28 DIAGNOSIS — Z23 Encounter for immunization: Secondary | ICD-10-CM

## 2020-12-28 DIAGNOSIS — R059 Cough, unspecified: Secondary | ICD-10-CM

## 2020-12-28 DIAGNOSIS — G4700 Insomnia, unspecified: Secondary | ICD-10-CM | POA: Diagnosis not present

## 2020-12-28 DIAGNOSIS — R739 Hyperglycemia, unspecified: Secondary | ICD-10-CM

## 2020-12-28 DIAGNOSIS — I10 Essential (primary) hypertension: Secondary | ICD-10-CM

## 2020-12-28 MED ORDER — ALLOPURINOL 300 MG PO TABS
300.0000 mg | ORAL_TABLET | Freq: Every day | ORAL | 11 refills | Status: DC
Start: 2020-12-28 — End: 2022-01-07

## 2020-12-28 MED ORDER — METHYLPREDNISOLONE 4 MG PO TBPK: ORAL_TABLET | ORAL | 0 refills | Status: DC

## 2020-12-28 MED ORDER — DOXYCYCLINE HYCLATE 100 MG PO TABS: 100.0000 mg | ORAL_TABLET | Freq: Two times a day (BID) | ORAL | 0 refills | Status: DC

## 2020-12-28 MED ORDER — TAMSULOSIN HCL 0.4 MG PO CAPS
0.4000 mg | ORAL_CAPSULE | Freq: Every day | ORAL | 3 refills | Status: DC
Start: 2020-12-28 — End: 2022-01-01

## 2020-12-28 MED ORDER — SILDENAFIL CITRATE 100 MG PO TABS: 100.0000 mg | ORAL_TABLET | Freq: Every day | ORAL | 3 refills | Status: DC | PRN

## 2020-12-28 NOTE — Assessment & Plan Note (Addendum)
Better after surgery - Dr Venetia Maxon. NS f/u is pending. Start Medrol pack

## 2020-12-28 NOTE — Progress Notes (Signed)
Subjective:  Patient ID: [name], male    DOB: 1940-12-09  Age: 80 y.o. MRN: [id number]  CC: Follow-up   HPI Brent Turner presents for insomnia, anxiety, wt loss. Wife died. 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 Stress. C/o LBP, cough  Outpatient Medications Prior to Visit  Medication Sig Dispense Refill  . allopurinol (ZYLOPRIM) 300 MG tablet Take 1 tablet (300 mg total) by mouth daily. 30 tablet 11  . amLODipine (NORVASC) 5 MG tablet Take 1 tablet (5 mg total) by mouth daily. 90 tablet 3  . Cholecalciferol 1000 UNITS tablet Take 1,000 Units by mouth daily.    Marland Kitchen HYDROcodone-acetaminophen (NORCO/VICODIN) 5-325 MG tablet Take by mouth. TAKE 1 BY MOUTH EVERY 6 HOURS AS NEED FOR PAIN    . predniSONE (DELTASONE) 10 MG tablet TAKE 2 TABLETS BY MOUTH DAILY FOR 2-3 DAYS AS NEEDED FOR GOUT (Patient taking differently: Take 10 mg by mouth daily as needed (Gout).) 60 tablet 0  . tamsulosin (FLOMAX) 0.4 MG CAPS capsule Take 1 capsule (0.4 mg total) by mouth daily. 90 capsule 3  . docusate sodium (COLACE) 100 MG capsule Take 1 capsule (100 mg total) by mouth 2 (two) times daily. (Patient not taking: Reported on 12/28/2020) 10 capsule 0  . HYDROcodone-acetaminophen (NORCO/VICODIN) 5-325 MG tablet Take 2 tablets by mouth every 4 (four) hours as needed for severe pain ((score 7 to 10)). (Patient not taking: Reported on 12/28/2020) 30 tablet 0  . methocarbamol (ROBAXIN) 500 MG tablet Take 1 tablet (500 mg total) by mouth every 6 (six) hours as needed for muscle spasms. (Patient not taking: Reported on 12/28/2020) 90 tablet 1  . triamcinolone cream (KENALOG) 0.5 % Apply 1 application topically 2 (two) times daily. (Patient not taking: Reported on 12/28/2020) 30 g 3   No facility-administered medications prior to visit.    ROS: Review of Systems  Constitutional: Positive for fatigue. Negative for appetite change and unexpected weight change.  HENT: Negative for congestion, nosebleeds, sneezing, sore throat and  trouble swallowing.   Eyes: Negative for itching and visual disturbance.  Respiratory: Positive for cough.   Cardiovascular: Negative for chest pain, palpitations and leg swelling.  Gastrointestinal: Negative for abdominal distention, blood in stool, diarrhea and nausea.  Genitourinary: Negative for frequency and hematuria.  Musculoskeletal: Positive for arthralgias and gait problem. Negative for back pain, joint swelling and neck pain.  Skin: Negative for rash.  Neurological: Negative for dizziness, tremors, speech difficulty and weakness.  Psychiatric/Behavioral: Positive for sleep disturbance. Negative for agitation, dysphoric mood and suicidal ideas. The patient is nervous/anxious.     Objective:  BP 138/78 (BP Location: Left Arm)   Pulse 97   Temp 97.8 F (36.6 C) (Oral)   Ht 6' (1.829 m)   Wt 137 lb 6.4 oz (62.3 kg)   SpO2 98%   BMI 18.63 kg/m   BP Readings from Last 3 Encounters:  12/28/20 138/78  04/22/20 (!) 141/85  04/22/20 123/67    Wt Readings from Last 3 Encounters:  12/28/20 137 lb 6.4 oz (62.3 kg)  04/21/20 135 lb (61.2 kg)  04/19/20 135 lb 9.6 oz (61.5 kg)    Physical Exam Constitutional:      General: He is not in acute distress.    Appearance: He is well-developed.     Comments: NAD  Eyes:     Conjunctiva/sclera: Conjunctivae normal.     Pupils: Pupils are equal, round, and reactive to light.  Neck:  Thyroid: No thyromegaly.     Vascular: No JVD.  Cardiovascular:     Rate and Rhythm: Normal rate and regular rhythm.     Heart sounds: Normal heart sounds. No murmur heard. No friction rub. No gallop.   Pulmonary:     Effort: Pulmonary effort is normal. No respiratory distress.     Breath sounds: Normal breath sounds. No wheezing or rales.  Chest:     Chest wall: No tenderness.  Abdominal:     General: Bowel sounds are normal. There is no distension.     Palpations: Abdomen is soft. There is no mass.     Tenderness: There is no abdominal  tenderness. There is no guarding or rebound.  Musculoskeletal:        General: No tenderness. Normal range of motion.     Cervical back: Normal range of motion.  Lymphadenopathy:     Cervical: No cervical adenopathy.  Skin:    General: Skin is warm and dry.     Findings: No rash.  Neurological:     Mental Status: He is alert and oriented to person, place, and time.     Cranial Nerves: No cranial nerve deficit.     Motor: No abnormal muscle tone.     Coordination: Coordination normal.     Gait: Gait normal.     Deep Tendon Reflexes: Reflexes are normal and symmetric.  Psychiatric:        Behavior: Behavior normal.        Thought Content: Thought content normal.        Judgment: Judgment normal.     Lab Results  Component Value Date   WBC 20.5 (H) 04/22/2020   HGB 11.7 (L) 04/22/2020   HCT 35.0 (L) 04/22/2020   PLT 219 04/22/2020   GLUCOSE 177 (H) 04/22/2020   CHOL 211 (H) 02/14/2020   TRIG 446.0 (H) 02/14/2020   HDL 54.10 02/14/2020   LDLDIRECT 106.0 02/14/2020   LDLCALC 102 (H) 04/27/2018   ALT 22 02/14/2020   AST 19 02/14/2020   NA 139 04/22/2020   K 3.6 04/22/2020   CL 105 04/22/2020   CREATININE 0.99 04/22/2020   BUN 13 04/22/2020   CO2 25 04/22/2020   TSH 3.75 02/14/2020   PSA 4.93 (H) 02/14/2020   HGBA1C 7.0 (H) 07/09/2017    No results found.  Assessment & Plan:    Brent Primes, MD

## 2020-12-28 NOTE — Addendum Note (Signed)
Addended by: Deatra James on: 12/28/2020 10:32 AM   Modules accepted: Orders

## 2020-12-28 NOTE — Assessment & Plan Note (Addendum)
COPD exacerbation Medrol pack, Doxy po

## 2020-12-28 NOTE — Patient Instructions (Addendum)
Valerian root Melatonin   One of the best-kept secrets of [place] culinary culture is the apritif. An apritif (a word derived from the Latin term aperire,  "to open")  is an alcoholic beverage, usually low-proof, sipped before the start of a meal that simultaneously stimulates the appetite, relaxes the diner, and preps the digestive tract for the coming meal. Originally concocted by doctors in the Middle Ages by muddling different herbs and botanicals with alcohol, these beverages were consumed to alleviate stomach pains and digestive issues.  Apritifs can be made created out of an endless list of ingredients, but they traditionally fall into one of three categories: fortified wines such as sherry, vermouth, or such brand-name concoctions as Lillet; bitters such as Campari, Cynar, or Aperol; or anise-based drinks such as arak, ouzo, anisette, or Pernod. The low(er) alcohol content of these drinks, which usually ranges from 10 to 24 percent ABV, is designed to relieve diners of stress, open up their palates, and engage their senses, rather than get them drunk.

## 2020-12-28 NOTE — Assessment & Plan Note (Signed)
Cont w/Vit D 

## 2020-12-28 NOTE — Assessment & Plan Note (Signed)
Try Valerian root or Melatonin

## 2020-12-28 NOTE — Assessment & Plan Note (Signed)
Cont w/Amlodipine 

## 2021-02-05 DIAGNOSIS — M4316 Spondylolisthesis, lumbar region: Secondary | ICD-10-CM | POA: Diagnosis not present

## 2021-02-05 DIAGNOSIS — M479 Spondylosis, unspecified: Secondary | ICD-10-CM | POA: Diagnosis not present

## 2021-02-05 DIAGNOSIS — M545 Low back pain, unspecified: Secondary | ICD-10-CM | POA: Diagnosis not present

## 2021-02-05 DIAGNOSIS — M48062 Spinal stenosis, lumbar region with neurogenic claudication: Secondary | ICD-10-CM | POA: Diagnosis not present

## 2021-02-05 DIAGNOSIS — Z681 Body mass index (BMI) 19 or less, adult: Secondary | ICD-10-CM | POA: Diagnosis not present

## 2021-04-30 ENCOUNTER — Ambulatory Visit: Payer: Medicare Other | Admitting: Internal Medicine

## 2021-08-06 DIAGNOSIS — M5442 Lumbago with sciatica, left side: Secondary | ICD-10-CM | POA: Diagnosis not present

## 2021-08-06 DIAGNOSIS — M5441 Lumbago with sciatica, right side: Secondary | ICD-10-CM | POA: Diagnosis not present

## 2021-08-06 DIAGNOSIS — G8929 Other chronic pain: Secondary | ICD-10-CM | POA: Diagnosis not present

## 2021-08-06 DIAGNOSIS — M48062 Spinal stenosis, lumbar region with neurogenic claudication: Secondary | ICD-10-CM | POA: Diagnosis not present

## 2021-08-06 DIAGNOSIS — M48061 Spinal stenosis, lumbar region without neurogenic claudication: Secondary | ICD-10-CM | POA: Diagnosis not present

## 2021-08-06 DIAGNOSIS — I10 Essential (primary) hypertension: Secondary | ICD-10-CM | POA: Diagnosis not present

## 2021-08-06 DIAGNOSIS — M479 Spondylosis, unspecified: Secondary | ICD-10-CM | POA: Diagnosis not present

## 2021-08-15 ENCOUNTER — Ambulatory Visit (INDEPENDENT_AMBULATORY_CARE_PROVIDER_SITE_OTHER): Payer: Medicare Other | Admitting: Internal Medicine

## 2021-08-15 ENCOUNTER — Other Ambulatory Visit: Payer: Self-pay

## 2021-08-15 ENCOUNTER — Encounter: Payer: Self-pay | Admitting: Internal Medicine

## 2021-08-15 DIAGNOSIS — M5430 Sciatica, unspecified side: Secondary | ICD-10-CM

## 2021-08-15 DIAGNOSIS — N529 Male erectile dysfunction, unspecified: Secondary | ICD-10-CM | POA: Diagnosis not present

## 2021-08-15 DIAGNOSIS — I10 Essential (primary) hypertension: Secondary | ICD-10-CM

## 2021-08-15 MED ORDER — AMLODIPINE BESYLATE 5 MG PO TABS
5.0000 mg | ORAL_TABLET | Freq: Every day | ORAL | 3 refills | Status: DC
Start: 2021-08-15 — End: 2022-03-19

## 2021-08-15 MED ORDER — SILDENAFIL CITRATE 100 MG PO TABS
100.0000 mg | ORAL_TABLET | Freq: Every day | ORAL | 3 refills | Status: DC | PRN
Start: 2021-08-15 — End: 2021-10-18

## 2021-08-15 MED ORDER — TIZANIDINE HCL 4 MG PO TABS: 4.0000 mg | ORAL_TABLET | Freq: Four times a day (QID) | ORAL | 0 refills | Status: DC | PRN

## 2021-08-15 MED ORDER — HYDROCODONE-ACETAMINOPHEN 5-325 MG PO TABS
1.0000 | ORAL_TABLET | Freq: Four times a day (QID) | ORAL | 0 refills | Status: DC | PRN
Start: 2021-08-15 — End: 2021-09-10

## 2021-08-15 MED ORDER — PREDNISONE 10 MG PO TABS: ORAL_TABLET | ORAL | 1 refills | Status: DC

## 2021-08-15 NOTE — Progress Notes (Signed)
Subjective:  Patient ID: Brent Turner, male    DOB: 01-26-1941  Age: 80 y.o. MRN: [id number]  CC: Follow-up (4 month f/u)   HPI Zenith Lamphier Hata presents for R buttock/RLE pain x1 week. He took Medrol pack and 50 mg Diclofenac C/o ED Follow-up on hypertension    Outpatient Medications Prior to Visit  Medication Sig Dispense Refill   allopurinol (ZYLOPRIM) 300 MG tablet Take 1 tablet (300 mg total) by mouth daily. 30 tablet 11   Cholecalciferol 1000 UNITS tablet Take 1,000 Units by mouth daily.     tamsulosin (FLOMAX) 0.4 MG CAPS capsule Take 1 capsule (0.4 mg total) by mouth daily. 90 capsule 3   amLODipine (NORVASC) 5 MG tablet Take 1 tablet (5 mg total) by mouth daily. 90 tablet 3   diclofenac (VOLTAREN) 50 MG EC tablet Take 50 mg by mouth 2 (two) times daily. Take 50 mg by mouth 2 (two) times daily.     doxycycline (VIBRA-TABS) 100 MG tablet Take 1 tablet (100 mg total) by mouth 2 (two) times daily. 14 tablet 0   methylPREDNISolone (MEDROL DOSEPAK) 4 MG TBPK tablet As directed 21 tablet 0   sildenafil (VIAGRA) 100 MG tablet Take 1 tablet (100 mg total) by mouth daily as needed for erectile dysfunction. 12 tablet 3   No facility-administered medications prior to visit.    ROS: Review of Systems  Constitutional:  Positive for fatigue. Negative for appetite change and unexpected weight change.  HENT:  Negative for congestion, nosebleeds, sneezing, sore throat and trouble swallowing.   Eyes:  Negative for itching and visual disturbance.  Respiratory:  Negative for cough.   Cardiovascular:  Negative for chest pain, palpitations and leg swelling.  Gastrointestinal:  Negative for abdominal distention, blood in stool, diarrhea and nausea.  Genitourinary:  Negative for frequency and hematuria.  Musculoskeletal:  Positive for back pain and gait problem. Negative for joint swelling and neck pain.  Skin:  Negative for rash.  Neurological:  Negative for dizziness, tremors, speech  difficulty and weakness.  Psychiatric/Behavioral:  Negative for agitation, dysphoric mood and sleep disturbance. The patient is not nervous/anxious.    Objective:  BP 130/78 (BP Location: Left Arm)    Pulse 92    Temp 98.2 F (36.8 C) (Oral)    Ht 6' (1.829 m)    Wt 139 lb 9.6 oz (63.3 kg)    SpO2 95%    BMI 18.93 kg/m   BP Readings from Last 3 Encounters:  08/15/21 130/78  12/28/20 138/78  04/22/20 (!) 141/85    Wt Readings from Last 3 Encounters:  08/15/21 139 lb 9.6 oz (63.3 kg)  12/28/20 137 lb 6.4 oz (62.3 kg)  04/21/20 135 lb (61.2 kg)    Physical Exam Constitutional:      General: He is not in acute distress.    Appearance: He is well-developed. He is not toxic-appearing.     Comments: NAD  Eyes:     Conjunctiva/sclera: Conjunctivae normal.     Pupils: Pupils are equal, round, and reactive to light.  Neck:     Thyroid: No thyromegaly.     Vascular: No JVD.  Cardiovascular:     Rate and Rhythm: Normal rate and regular rhythm.     Heart sounds: Normal heart sounds. No murmur heard.   No friction rub. No gallop.  Pulmonary:     Effort: Pulmonary effort is normal. No respiratory distress.     Breath sounds: Normal breath sounds. No wheezing  or rales.  Chest:     Chest wall: No tenderness.  Abdominal:     General: Bowel sounds are normal. There is no distension.     Palpations: Abdomen is soft. There is no mass.     Tenderness: There is no abdominal tenderness. There is no guarding or rebound.  Musculoskeletal:        General: Tenderness present. Normal range of motion.     Cervical back: Normal range of motion.  Lymphadenopathy:     Cervical: No cervical adenopathy.  Skin:    General: Skin is warm and dry.     Findings: No rash.  Neurological:     Mental Status: He is alert and oriented to person, place, and time.     Cranial Nerves: No cranial nerve deficit.     Motor: No abnormal muscle tone.     Coordination: Coordination normal.     Gait: Gait abnormal.      Deep Tendon Reflexes: Reflexes are normal and symmetric.  Psychiatric:        Behavior: Behavior normal.        Thought Content: Thought content normal.        Judgment: Judgment normal.  Painful lumbar spine, antalgic gait  Lab Results  Component Value Date   WBC 20.5 (H) 04/22/2020   HGB 11.7 (L) 04/22/2020   HCT 35.0 (L) 04/22/2020   PLT 219 04/22/2020   GLUCOSE 177 (H) 04/22/2020   CHOL 211 (H) 02/14/2020   TRIG 446.0 (H) 02/14/2020   HDL 54.10 02/14/2020   LDLDIRECT 106.0 02/14/2020   LDLCALC 102 (H) 04/27/2018   ALT 22 02/14/2020   AST 19 02/14/2020   NA 139 04/22/2020   K 3.6 04/22/2020   CL 105 04/22/2020   CREATININE 0.99 04/22/2020   BUN 13 04/22/2020   CO2 25 04/22/2020   TSH 3.75 02/14/2020   PSA 4.93 (H) 02/14/2020   HGBA1C 7.0 (H) 07/09/2017    No results found.  Assessment & Plan:   Problem List Items Addressed This Visit     ERECTILE DYSFUNCTION    Chronic.  Prescribed Viagra as needed      Essential hypertension    Continue with amlodipine      Relevant Medications   amLODipine (NORVASC) 5 MG tablet   sildenafil (VIAGRA) 100 MG tablet   Sciatic leg pain    New, severe right-sided pain Move wallet to another pocket Medrol pack prescribed Norco as needed  Potential benefits of a short term opioids use as well as potential risks (i.e. addiction risk, apnea etc) and complications (i.e. Somnolence, constipation and others) were explained to the patient and were aknowledged. PT offered      Relevant Medications   tiZANidine (ZANAFLEX) 4 MG tablet      Meds ordered this encounter  Medications   HYDROcodone-acetaminophen (NORCO) 5-325 MG tablet    Sig: Take 1 tablet by mouth every 6 (six) hours as needed for moderate pain.    Dispense:  20 tablet    Refill:  0   tiZANidine (ZANAFLEX) 4 MG tablet    Sig: Take 1 tablet (4 mg total) by mouth every 6 (six) hours as needed for muscle spasms.    Dispense:  30 tablet    Refill:  0    predniSONE (DELTASONE) 10 MG tablet    Sig: Prednisone 10 mg: take 4 tabs a day x 3 days; then 3 tabs a day x 4 days; then 2 tabs a day  x 4 days, then 1 tab a day x 6 days, then stop. Take pc.    Dispense:  38 tablet    Refill:  1   amLODipine (NORVASC) 5 MG tablet    Sig: Take 1 tablet (5 mg total) by mouth daily.    Dispense:  90 tablet    Refill:  3   sildenafil (VIAGRA) 100 MG tablet    Sig: Take 1 tablet (100 mg total) by mouth daily as needed for erectile dysfunction.    Dispense:  12 tablet    Refill:  3      Follow-up: Return in about 2 weeks (around 08/29/2021) for a follow-up visit.  Walker Kehr, MD

## 2021-09-03 NOTE — Assessment & Plan Note (Signed)
New, severe right-sided pain Move wallet to another pocket Medrol pack prescribed Norco as needed  Potential benefits of a short term opioids use as well as potential risks (i.e. addiction risk, apnea etc) and complications (i.e. Somnolence, constipation and others) were explained to the patient and were aknowledged. PT offered

## 2021-09-03 NOTE — Assessment & Plan Note (Signed)
Chronic.  Prescribed Viagra as needed

## 2021-09-03 NOTE — Assessment & Plan Note (Signed)
Continue with amlodipine 

## 2021-09-10 ENCOUNTER — Encounter: Payer: Self-pay | Admitting: Internal Medicine

## 2021-09-10 ENCOUNTER — Ambulatory Visit: Payer: Medicare Other | Admitting: Internal Medicine

## 2021-09-10 ENCOUNTER — Ambulatory Visit (INDEPENDENT_AMBULATORY_CARE_PROVIDER_SITE_OTHER): Payer: Medicare Other | Admitting: Internal Medicine

## 2021-09-10 ENCOUNTER — Other Ambulatory Visit: Payer: Self-pay

## 2021-09-10 DIAGNOSIS — M5430 Sciatica, unspecified side: Secondary | ICD-10-CM | POA: Diagnosis not present

## 2021-09-10 DIAGNOSIS — E559 Vitamin D deficiency, unspecified: Secondary | ICD-10-CM

## 2021-09-10 DIAGNOSIS — I10 Essential (primary) hypertension: Secondary | ICD-10-CM | POA: Diagnosis not present

## 2021-09-10 MED ORDER — HYDROCODONE-ACETAMINOPHEN 5-325 MG PO TABS: 1.0000 | ORAL_TABLET | Freq: Four times a day (QID) | ORAL | 0 refills | Status: DC | PRN

## 2021-09-10 MED ORDER — PREDNISONE 10 MG PO TABS: ORAL_TABLET | ORAL | 1 refills | Status: DC

## 2021-09-10 NOTE — Progress Notes (Addendum)
Subjective:  Patient ID: Brent Turner, male    DOB: 01/06/1941  Age: 81 y.o. MRN: [id number]  CC: Follow-up (2 week f/u)   HPI Brent Turner presents for LBP and RLE pain - 5-10% better now after he ran out of [place] He is s/p spinal fusion in 2021. Pain is worse w/short walks. Pain is similar to 2021, but it is on the R now... C/o ED  Outpatient Medications Prior to Visit  Medication Sig Dispense Refill   allopurinol (ZYLOPRIM) 300 MG tablet Take 1 tablet (300 mg total) by mouth daily. 30 tablet 11   amLODipine (NORVASC) 5 MG tablet Take 1 tablet (5 mg total) by mouth daily. 90 tablet 3   Cholecalciferol 1000 UNITS tablet Take 1,000 Units by mouth daily.     diclofenac (VOLTAREN) 50 MG EC tablet Take 50 mg by mouth 2 (two) times daily. Take 50 mg by mouth 2 (two) times daily     sildenafil (VIAGRA) 100 MG tablet Take 1 tablet (100 mg total) by mouth daily as needed for erectile dysfunction. 12 tablet 3   tamsulosin (FLOMAX) 0.4 MG CAPS capsule Take 1 capsule (0.4 mg total) by mouth daily. 90 capsule 3   tiZANidine (ZANAFLEX) 4 MG tablet Take 1 tablet (4 mg total) by mouth every 6 (six) hours as needed for muscle spasms. 30 tablet 0   HYDROcodone-acetaminophen (NORCO) 5-325 MG tablet Take 1 tablet by mouth every 6 (six) hours as needed for moderate pain. 20 tablet 0   predniSONE (DELTASONE) 10 MG tablet Prednisone 10 mg: take 4 tabs a day x 3 days; then 3 tabs a day x 4 days; then 2 tabs a day x 4 days, then 1 tab a day x 6 days, then stop. Take pc. 38 tablet 1   No facility-administered medications prior to visit.    ROS: Review of Systems  Constitutional:  Negative for appetite change, fatigue and unexpected weight change.  HENT:  Negative for congestion, nosebleeds, sneezing, sore throat and trouble swallowing.   Eyes:  Negative for itching and visual disturbance.  Respiratory:  Negative for cough.   Cardiovascular:  Negative for chest pain, palpitations and leg swelling.   Gastrointestinal:  Negative for abdominal distention, blood in stool, diarrhea and nausea.  Genitourinary:  Negative for frequency and hematuria.  Musculoskeletal:  Positive for back pain and gait problem. Negative for joint swelling and neck pain.  Skin:  Negative for rash.  Neurological:  Negative for dizziness, tremors, speech difficulty and weakness.  Psychiatric/Behavioral:  Negative for agitation, dysphoric mood and sleep disturbance. The patient is not nervous/anxious.    Objective:  BP (!) 152/82 (BP Location: Left Arm)    Pulse 81    Temp 98 F (36.7 C) (Oral)    Ht 6' (1.829 m)    Wt 142 lb (64.4 kg)    SpO2 96%    BMI 19.26 kg/m   BP Readings from Last 3 Encounters:  09/10/21 (!) 152/82  08/15/21 130/78  12/28/20 138/78    Wt Readings from Last 3 Encounters:  09/10/21 142 lb (64.4 kg)  08/15/21 139 lb 9.6 oz (63.3 kg)  12/28/20 137 lb 6.4 oz (62.3 kg)    Physical Exam Constitutional:      General: He is not in acute distress.    Appearance: He is well-developed.     Comments: NAD  Eyes:     Conjunctiva/sclera: Conjunctivae normal.     Pupils: Pupils are equal, round, and  reactive to light.  Neck:     Thyroid: No thyromegaly.     Vascular: No JVD.  Cardiovascular:     Rate and Rhythm: Normal rate and regular rhythm.     Heart sounds: Normal heart sounds. No murmur heard.   No friction rub. No gallop.  Pulmonary:     Effort: Pulmonary effort is normal. No respiratory distress.     Breath sounds: Normal breath sounds. No wheezing or rales.  Chest:     Chest wall: No tenderness.  Abdominal:     General: Bowel sounds are normal. There is no distension.     Palpations: Abdomen is soft. There is no mass.     Tenderness: There is no abdominal tenderness. There is no guarding or rebound.  Musculoskeletal:        General: Tenderness present. Normal range of motion.     Cervical back: Normal range of motion.  Lymphadenopathy:     Cervical: No cervical adenopathy.   Skin:    General: Skin is warm and dry.     Findings: No rash.  Neurological:     Mental Status: He is alert and oriented to person, place, and time.     Cranial Nerves: No cranial nerve deficit.     Motor: No abnormal muscle tone.     Coordination: Coordination normal.     Gait: Gait abnormal.     Deep Tendon Reflexes: Reflexes are normal and symmetric.  Psychiatric:        Behavior: Behavior normal.        Thought Content: Thought content normal.        Judgment: Judgment normal.   Antalgic gait LS w/pain Limping  Lab Results  Component Value Date   WBC 20.5 (H) 04/22/2020   HGB 11.7 (L) 04/22/2020   HCT 35.0 (L) 04/22/2020   PLT 219 04/22/2020   GLUCOSE 177 (H) 04/22/2020   CHOL 211 (H) 02/14/2020   TRIG 446.0 (H) 02/14/2020   HDL 54.10 02/14/2020   LDLDIRECT 106.0 02/14/2020   LDLCALC 102 (H) 04/27/2018   ALT 22 02/14/2020   AST 19 02/14/2020   NA 139 04/22/2020   K 3.6 04/22/2020   CL 105 04/22/2020   CREATININE 0.99 04/22/2020   BUN 13 04/22/2020   CO2 25 04/22/2020   TSH 3.75 02/14/2020   PSA 4.93 (H) 02/14/2020   HGBA1C 7.0 (H) 07/09/2017    No results found.  Assessment & Plan:   Problem List Items Addressed This Visit     Essential hypertension    Treat pain BP was ok before      Sciatic leg pain    LS X ray Re-start Norco prn Finish prednisone NS is scheduling LS MRI      Vitamin D deficiency    On Vit D         Meds ordered this encounter  Medications   HYDROcodone-acetaminophen (NORCO) 5-325 MG tablet    Sig: Take 1 tablet by mouth every 6 (six) hours as needed for moderate pain.    Dispense:  90 tablet    Refill:  0   predniSONE (DELTASONE) 10 MG tablet    Sig: Prednisone 10 mg: take 4 tabs a day x 3 days; then 3 tabs a day x 4 days; then 2 tabs a day x 4 days, then 1 tab a day x 6 days, then stop. Take pc.    Dispense:  38 tablet    Refill:  1  Follow-up: Return in about 4 weeks (around 10/08/2021) for a follow-up  visit.  Walker Kehr, MD

## 2021-09-10 NOTE — Assessment & Plan Note (Addendum)
LS X ray Re-start Norco prn Finish prednisone NS is scheduling LS MRI

## 2021-09-10 NOTE — Assessment & Plan Note (Signed)
On Vit D 

## 2021-09-10 NOTE — Patient Instructions (Addendum)
Trekking poles    There are natural ways to boost your testosterone:    2. Strength Training    Strength training is also known to boost testosterone levels, provided you are doing so intensely enough. When strength training to boost testosterone, you'll want to increase the weight and lower your number of reps, and then focus on exercises that work a large number of muscles.  3. Optimize Your Vitamin D Levels Vitamin D, a steroid hormone, is essential for the healthy development of the nucleus of the sperm cell, and helps maintain semen quality and sperm count. Vitamin D also increases levels of testosterone, which may boost libido. In one study, overweight men who were given vitamin D supplements had a significant increase in testosterone levels after one year.  4. Reduce Stress When you're under a lot of stress, your body releases high levels of the stress hormone cortisol. This hormone actually blocks the effects of testosterone, presumably because, from a biological standpoint, testosterone-associated behaviors (mating, competing, aggression) may have lowered your chances of survival in an emergency (hence, the "fight or flight" response is dominant, courtesy of cortisol).  6. Eat Healthy Fats By healthy, this means not only mon- and polyunsaturated fats, like that found in avocadoes and nuts, but also saturated, as these are essential for building testosterone. Research shows that a diet with less than 40 percent of energy as fat (and that mainly from animal sources, i.e. saturated) lead to a decrease in testosterone levels.  It's important to understand that your body requires saturated fats from animal and vegetable sources (such as meat, dairy, certain oils, and tropical plants like coconut) for optimal functioning, and if you neglect this important food group in favor of sugar, grains and other starchy carbs, your health and weight are almost guaranteed to suffer. Examples of healthy  fats you can eat more of to give your testosterone levels a boost include:  Olives and Olive oil  Coconuts and coconut oil Butter made from organic milk  Raw nuts, such as, almonds or pecans Eggs Avocados   Meats Palm oil Unheated organic nut oils   7. "Testosterone boosters" containing Vitamin D-3, Niacin, Vitamin B-6, Vitamin B-12, Magnesium, Zinc, Selenium, D-Aspartic Acid, Fenugreed Seed Extract, [place], [place] Extract, [place] Ginseng may be helpful as well.

## 2021-09-10 NOTE — Assessment & Plan Note (Signed)
Treat pain BP was ok before

## 2021-10-08 ENCOUNTER — Ambulatory Visit: Payer: Medicare Other | Admitting: Internal Medicine

## 2021-10-18 ENCOUNTER — Other Ambulatory Visit: Payer: Self-pay

## 2021-10-18 ENCOUNTER — Encounter: Payer: Self-pay | Admitting: Internal Medicine

## 2021-10-18 ENCOUNTER — Telehealth: Payer: Self-pay | Admitting: Internal Medicine

## 2021-10-18 ENCOUNTER — Ambulatory Visit (INDEPENDENT_AMBULATORY_CARE_PROVIDER_SITE_OTHER): Payer: Medicare Other | Admitting: Internal Medicine

## 2021-10-18 VITALS — BP 134/74 | HR 92 | Temp 98.2°F | Ht 72.0 in | Wt 142.0 lb

## 2021-10-18 DIAGNOSIS — M5430 Sciatica, unspecified side: Secondary | ICD-10-CM

## 2021-10-18 DIAGNOSIS — I10 Essential (primary) hypertension: Secondary | ICD-10-CM

## 2021-10-18 DIAGNOSIS — E559 Vitamin D deficiency, unspecified: Secondary | ICD-10-CM

## 2021-10-18 MED ORDER — TADALAFIL 10 MG PO TABS: 10.0000 mg | ORAL_TABLET | ORAL | 3 refills | Status: DC | PRN

## 2021-10-18 MED ORDER — HYDROCODONE-ACETAMINOPHEN 5-325 MG PO TABS: 1.0000 | ORAL_TABLET | Freq: Three times a day (TID) | ORAL | 0 refills | Status: DC | PRN

## 2021-10-18 MED ORDER — TIZANIDINE HCL 4 MG PO TABS: 4.0000 mg | ORAL_TABLET | Freq: Four times a day (QID) | ORAL | 1 refills | Status: DC | PRN

## 2021-10-18 NOTE — Assessment & Plan Note (Signed)
°  Pain is on the both sides Disk surgery - 2020 Move wallet to another pocket Medrol pack prescribed Norco as needed  Potential benefits of a short term opioids use as well as potential risks (i.e. addiction risk, apnea etc) and complications (i.e. Somnolence, constipation and others) were explained to the patient and were aknowledged. PT offered NS consult offered

## 2021-10-18 NOTE — Assessment & Plan Note (Signed)
Cont w/Vit D 

## 2021-10-18 NOTE — Assessment & Plan Note (Signed)
Cont on Amlodipine 

## 2021-10-18 NOTE — Progress Notes (Signed)
Subjective:  Patient ID: PRIDE NOVO, male    DOB: Mar 08, 1941  Age: 81 y.o. MRN: [id number]  CC: No chief complaint on file.   HPI Brent Turner presents for left leg sciatica - overall better. Brent Turner has to take one Norco qam for severe pain in the leg or legs  C/o ED - Viagra did not help F/u HTN  Outpatient Medications Prior to Visit  Medication Sig Dispense Refill   allopurinol (ZYLOPRIM) 300 MG tablet Take 1 tablet (300 mg total) by mouth daily. 30 tablet 11   amLODipine (NORVASC) 5 MG tablet Take 1 tablet (5 mg total) by mouth daily. 90 tablet 3   Cholecalciferol 1000 UNITS tablet Take 1,000 Units by mouth daily.     diclofenac (VOLTAREN) 50 MG EC tablet Take 50 mg by mouth 2 (two) times daily. Take 50 mg by mouth 2 (two) times daily     tamsulosin (FLOMAX) 0.4 MG CAPS capsule Take 1 capsule (0.4 mg total) by mouth daily. 90 capsule 3   HYDROcodone-acetaminophen (NORCO) 5-325 MG tablet Take 1 tablet by mouth every 6 (six) hours as needed for moderate pain. 90 tablet 0   predniSONE (DELTASONE) 10 MG tablet Prednisone 10 mg: take 4 tabs a day x 3 days; then 3 tabs a day x 4 days; then 2 tabs a day x 4 days, then 1 tab a day x 6 days, then stop. Take pc. 38 tablet 1   sildenafil (VIAGRA) 100 MG tablet Take 1 tablet (100 mg total) by mouth daily as needed for erectile dysfunction. 12 tablet 3   tiZANidine (ZANAFLEX) 4 MG tablet Take 1 tablet (4 mg total) by mouth every 6 (six) hours as needed for muscle spasms. 30 tablet 0   No facility-administered medications prior to visit.    ROS: Review of Systems  Constitutional:  Positive for fatigue. Negative for appetite change and unexpected weight change.  HENT:  Negative for congestion, nosebleeds, sneezing, sore throat and trouble swallowing.   Eyes:  Negative for itching and visual disturbance.  Respiratory:  Negative for cough.   Cardiovascular:  Negative for chest pain, palpitations and leg swelling.  Gastrointestinal:   Negative for abdominal distention, blood in stool, diarrhea and nausea.  Genitourinary:  Negative for frequency and hematuria.  Musculoskeletal:  Positive for back pain. Negative for gait problem, joint swelling and neck pain.  Skin:  Negative for rash.  Neurological:  Negative for dizziness, tremors, speech difficulty and weakness.  Psychiatric/Behavioral:  Negative for agitation, dysphoric mood, sleep disturbance and suicidal ideas. The patient is nervous/anxious.    Objective:  BP 134/74 (BP Location: Right Arm, Patient Position: Sitting, Cuff Size: Large)    Pulse 92    Temp 98.2 F (36.8 C) (Oral)    Ht 6' (1.829 m)    Wt 142 lb (64.4 kg)    SpO2 97%    BMI 19.26 kg/m   BP Readings from Last 3 Encounters:  10/18/21 134/74  09/10/21 (!) 152/82  08/15/21 130/78    Wt Readings from Last 3 Encounters:  10/18/21 142 lb (64.4 kg)  09/10/21 142 lb (64.4 kg)  08/15/21 139 lb 9.6 oz (63.3 kg)    Physical Exam Constitutional:      General: He is not in acute distress.    Appearance: He is well-developed.     Comments: NAD  Eyes:     Conjunctiva/sclera: Conjunctivae normal.     Pupils: Pupils are equal, round, and reactive to light.  Neck:     Thyroid: No thyromegaly.     Vascular: No JVD.  Cardiovascular:     Rate and Rhythm: Normal rate and regular rhythm.     Heart sounds: Normal heart sounds. No murmur heard.   No friction rub. No gallop.  Pulmonary:     Effort: Pulmonary effort is normal. No respiratory distress.     Breath sounds: Normal breath sounds. No wheezing or rales.  Chest:     Chest wall: No tenderness.  Abdominal:     General: Bowel sounds are normal. There is no distension.     Palpations: Abdomen is soft. There is no mass.     Tenderness: There is no abdominal tenderness. There is no guarding or rebound.  Musculoskeletal:        General: Tenderness present. Normal range of motion.     Cervical back: Normal range of motion.  Lymphadenopathy:      Cervical: No cervical adenopathy.  Skin:    General: Skin is warm and dry.     Findings: No rash.  Neurological:     Mental Status: He is alert and oriented to person, place, and time.     Cranial Nerves: No cranial nerve deficit.     Motor: No abnormal muscle tone.     Coordination: Coordination normal.     Gait: Gait normal.     Deep Tendon Reflexes: Reflexes are normal and symmetric.  Psychiatric:        Behavior: Behavior normal.        Thought Content: Thought content normal.        Judgment: Judgment normal.  LS is a little tender now  Lab Results  Component Value Date   WBC 20.5 (H) 04/22/2020   HGB 11.7 (L) 04/22/2020   HCT 35.0 (L) 04/22/2020   PLT 219 04/22/2020   GLUCOSE 177 (H) 04/22/2020   CHOL 211 (H) 02/14/2020   TRIG 446.0 (H) 02/14/2020   HDL 54.10 02/14/2020   LDLDIRECT 106.0 02/14/2020   LDLCALC 102 (H) 04/27/2018   ALT 22 02/14/2020   AST 19 02/14/2020   NA 139 04/22/2020   K 3.6 04/22/2020   CL 105 04/22/2020   CREATININE 0.99 04/22/2020   BUN 13 04/22/2020   CO2 25 04/22/2020   TSH 3.75 02/14/2020   PSA 4.93 (H) 02/14/2020   HGBA1C 7.0 (H) 07/09/2017    No results found.  Assessment & Plan:   Problem List Items Addressed This Visit     Essential hypertension    Cont on Amlodipine      Relevant Medications   tadalafil (CIALIS) 10 MG tablet   Sciatic leg pain - Primary     Pain is on the both sides Disk surgery - 2020 Move wallet to another pocket Medrol pack prescribed Norco as needed  Potential benefits of a short term opioids use as well as potential risks (i.e. addiction risk, apnea etc) and complications (i.e. Somnolence, constipation and others) were explained to the patient and were aknowledged. PT offered NS consult offered       Relevant Medications   tiZANidine (ZANAFLEX) 4 MG tablet   Other Relevant Orders   Ambulatory referral to Neurosurgery   Vitamin D deficiency    Cont w/Vit D         Meds ordered this  encounter  Medications   HYDROcodone-acetaminophen (NORCO) 5-325 MG tablet    Sig: Take 1 tablet by mouth every 8 (eight) hours as needed for severe pain.  Dispense:  90 tablet    Refill:  0   tiZANidine (ZANAFLEX) 4 MG tablet    Sig: Take 1 tablet (4 mg total) by mouth every 6 (six) hours as needed for muscle spasms.    Dispense:  30 tablet    Refill:  1   tadalafil (CIALIS) 10 MG tablet    Sig: Take 1 tablet (10 mg total) by mouth every other day as needed for erectile dysfunction.    Dispense:  10 tablet    Refill:  3      Follow-up: Return in about 3 months (around 01/15/2022) for a follow-up visit.  Walker Kehr, MD

## 2021-10-18 NOTE — Telephone Encounter (Signed)
[facility] calling in  Says all [facility]ies within a 20 mile radius are currently OOS of the HYDROcodone-acetaminophen ([place]) 5-325 MG tablet  Says they do have the 10-325mg  tablets in if provider wants to send in new rx for patient

## 2021-10-19 MED ORDER — HYDROCODONE-ACETAMINOPHEN 10-325 MG PO TABS: 0.5000 | ORAL_TABLET | Freq: Three times a day (TID) | ORAL | 0 refills | Status: AC | PRN

## 2021-10-19 NOTE — Telephone Encounter (Signed)
Okay.  Thanks.

## 2021-11-21 DIAGNOSIS — I10 Essential (primary) hypertension: Secondary | ICD-10-CM | POA: Diagnosis not present

## 2021-11-21 DIAGNOSIS — M48062 Spinal stenosis, lumbar region with neurogenic claudication: Secondary | ICD-10-CM | POA: Diagnosis not present

## 2021-11-21 DIAGNOSIS — Z681 Body mass index (BMI) 19 or less, adult: Secondary | ICD-10-CM | POA: Diagnosis not present

## 2021-11-22 ENCOUNTER — Other Ambulatory Visit (HOSPITAL_COMMUNITY): Payer: Self-pay | Admitting: Neurological Surgery

## 2021-11-22 DIAGNOSIS — M48062 Spinal stenosis, lumbar region with neurogenic claudication: Secondary | ICD-10-CM

## 2021-11-28 ENCOUNTER — Ambulatory Visit (HOSPITAL_COMMUNITY)
Admission: RE | Admit: 2021-11-28 | Discharge: 2021-11-28 | Disposition: A | Discharge status: Home / Self Care | Payer: Medicare Other | Source: Ambulatory Visit | Attending: Neurological Surgery | Admitting: Neurological Surgery

## 2021-11-28 DIAGNOSIS — M48062 Spinal stenosis, lumbar region with neurogenic claudication: Secondary | ICD-10-CM | POA: Insufficient documentation

## 2021-11-28 DIAGNOSIS — M48061 Spinal stenosis, lumbar region without neurogenic claudication: Secondary | ICD-10-CM | POA: Diagnosis not present

## 2021-11-28 DIAGNOSIS — M4319 Spondylolisthesis, multiple sites in spine: Secondary | ICD-10-CM | POA: Diagnosis not present

## 2021-11-28 DIAGNOSIS — G834 Cauda equina syndrome: Secondary | ICD-10-CM | POA: Diagnosis not present

## 2021-11-28 DIAGNOSIS — M47816 Spondylosis without myelopathy or radiculopathy, lumbar region: Secondary | ICD-10-CM | POA: Diagnosis not present

## 2021-11-28 IMAGING — MR MR LUMBAR SPINE W/O CM
4 of 5 series · 26 of 48 positions shown · non-contrast
Comparison: MRI lumbar spine [DATE].

CLINICAL DATA: Disc bulging and bilateral facet arthropathy with

EXAM:
MRI LUMBAR SPINE WITHOUT CONTRAST
TECHNIQUE: Multiplanar, multisequence MR imaging of the lumbar spine was
performed. No intravenous contrast was administered.

[Series 5: T2 · sagittal · 4.0mm · 0.73mm/px · 6 of 18 slices shown (1 of 2)]
[im 1/18]
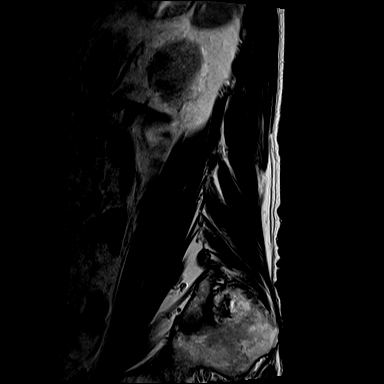
[im 4/18]
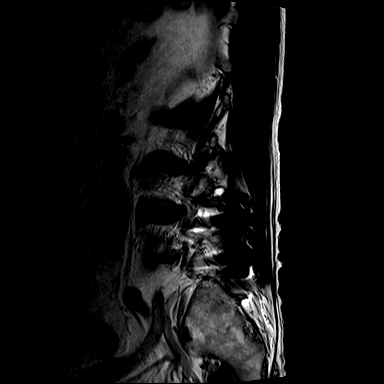
[im 7/18]
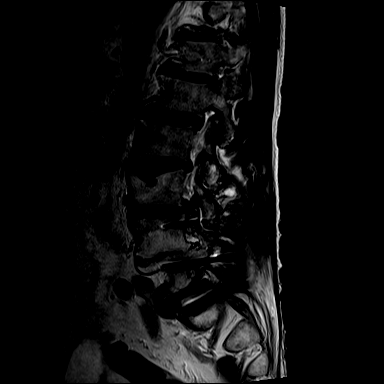
[im 11/18]
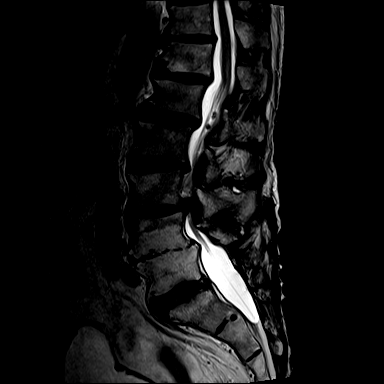
[im 14/18]
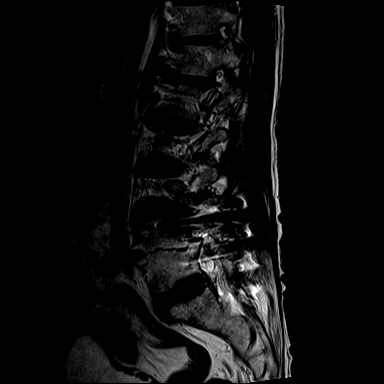
[im 18/18]
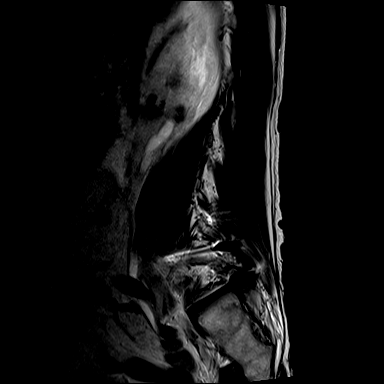

[Series 7: T1 · sagittal · 4.0mm · 0.88mm/px · 7 of 18 slices shown (1 of 2)]
[im 1/18]
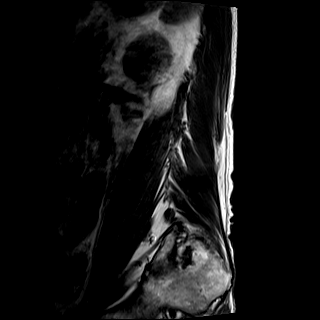
[im 3/18]
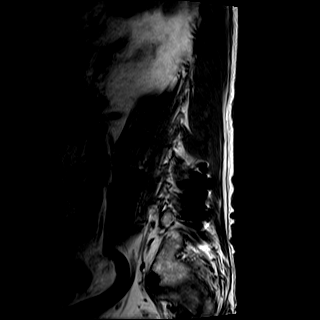
[im 6/18]
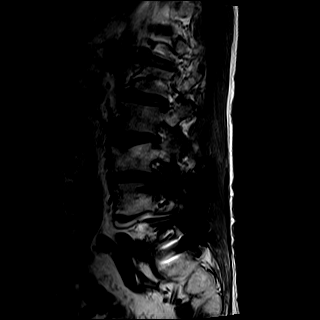
[im 9/18]
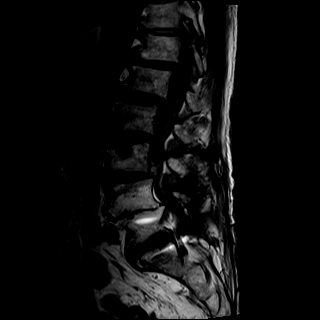
[im 12/18]
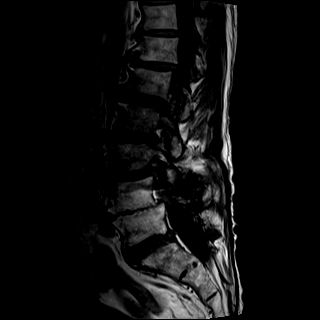
[im 15/18]
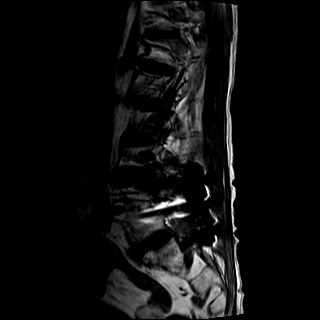
[im 18/18]
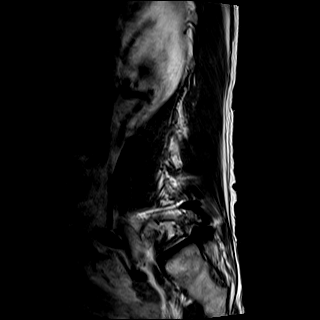

[Series 8: T2 · axial · 4.0mm · 0.57mm/px · z∈[-65,+141]mm · 8 of 35 slices shown (2 of 2)]
[im 1/35]
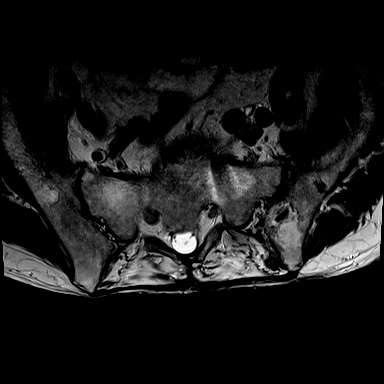
[im 6/35]
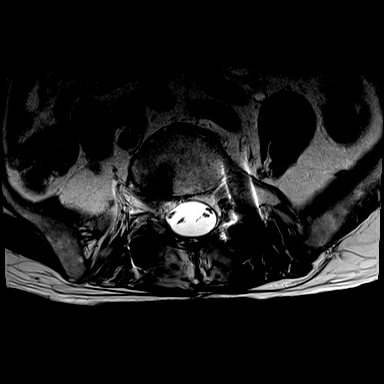
[im 11/35]
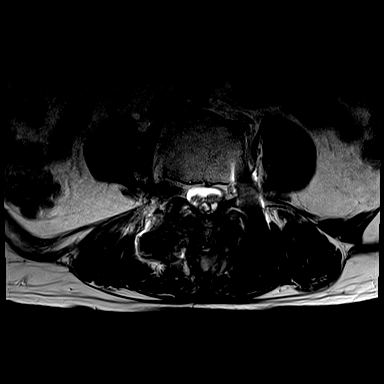
[im 16/35]
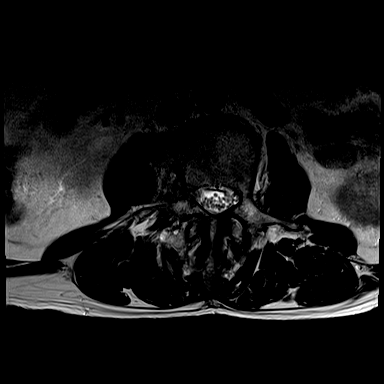
[im 19/35]
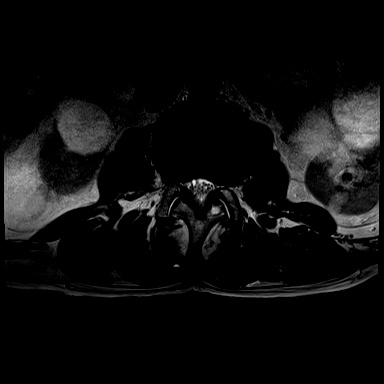
[im 24/35]
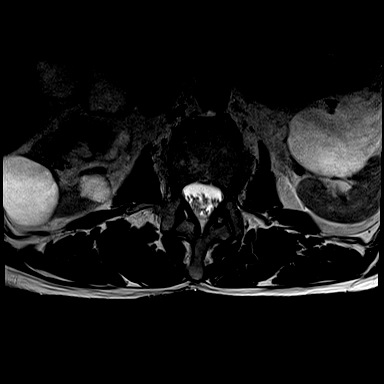
[im 29/35]
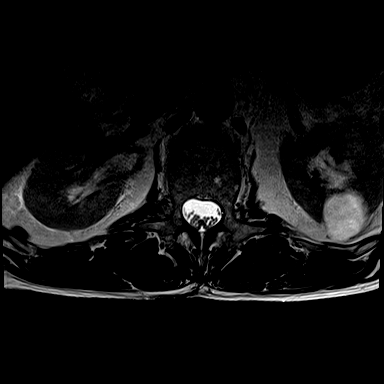
[im 35/35]
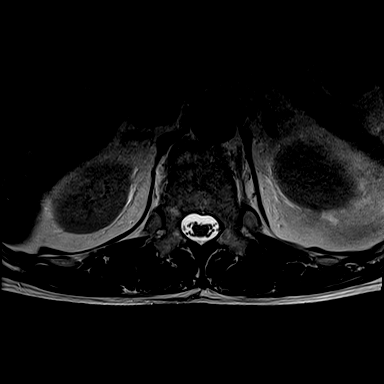

[Series 9: T1 · axial · 4.0mm · 0.34mm/px · z∈[-65,+111]mm · 5 of 35 slices shown (2 of 2)]
[im 1/35]
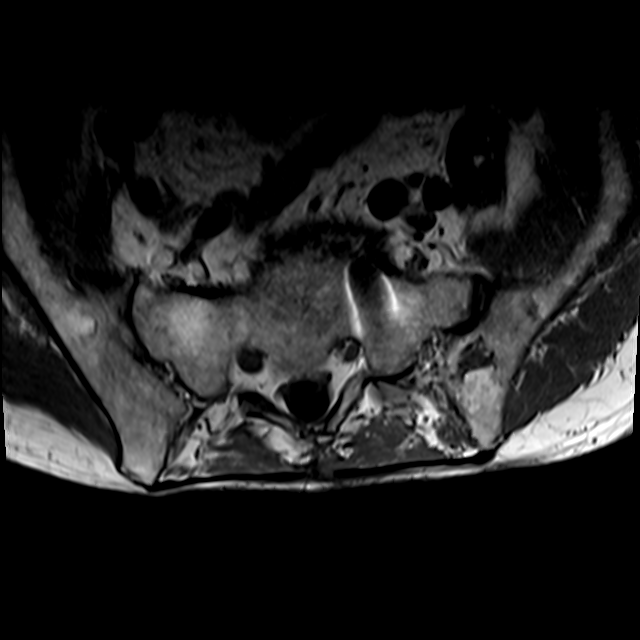
[im 6/35]
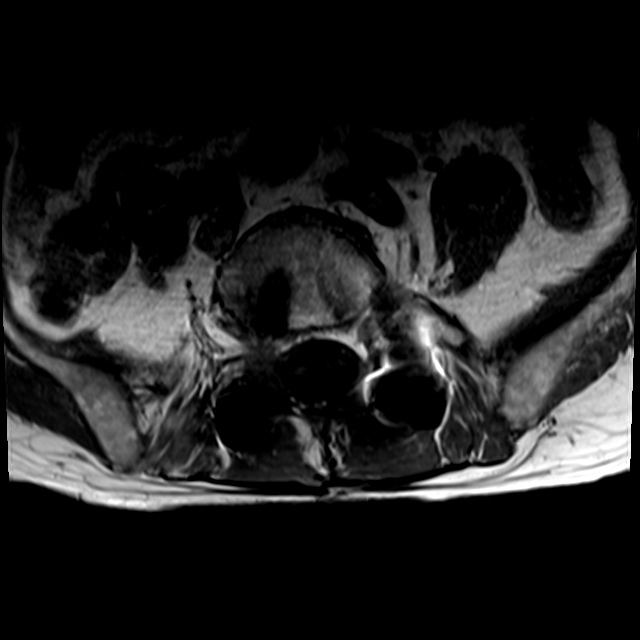
[im 11/35]
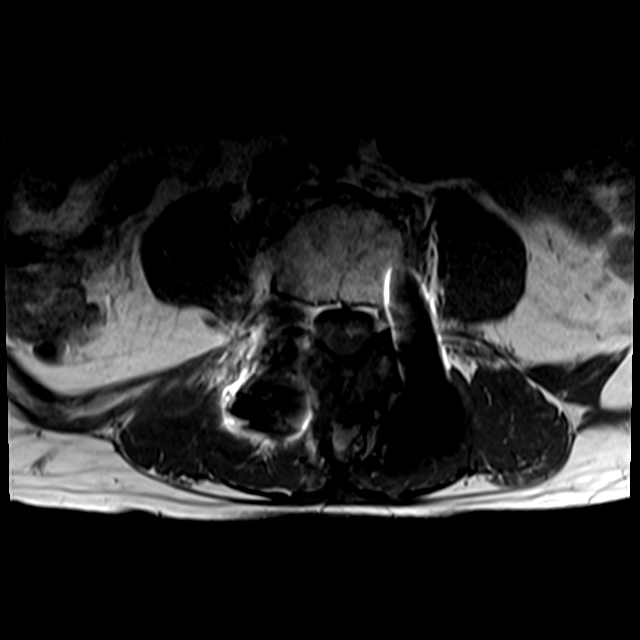
[im 19/35]
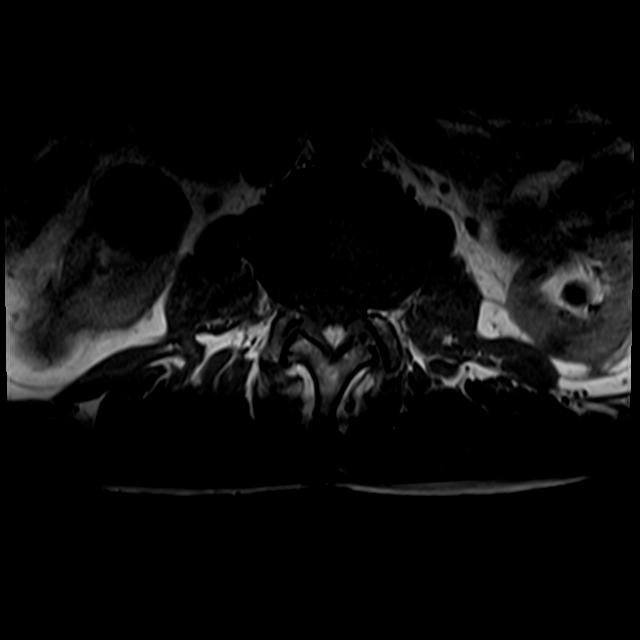
[im 29/35]
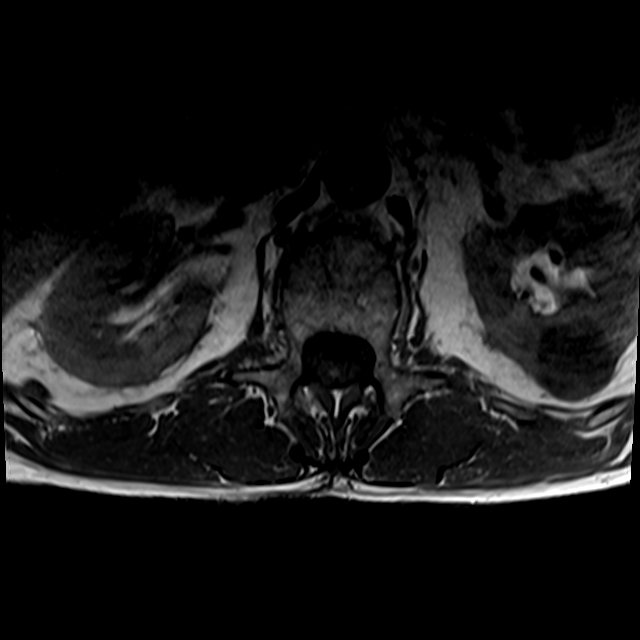

[26 of 48 positions shown; findings below may reference images not displayed]

FINDINGS: Segmentation: Standard segmentation is assumed. The inferior-most
fully formed intervertebral disc is labeled L5-S1.

Alignment: Grade 1 anterolisthesis of T12 on L1 and L1 on L2. Grade
1 anterolisthesis of L3 on L4, progressed.

Vertebrae: L4-L5 PLIF with chronic appearing degenerative/discogenic
endplate signal changes. Milder degenerative/discogenic endplate
signal changes at other levels, including tear early at T12-L1 and
L2-L3. No specific evidence of acute fracture
discitis/osteomyelitis. No suspicious bone lesions. Schmorl's node
along the superior L3 endplate, new.

Conus medullaris and cauda equina: Conus extends to the L1 level.
Conus appears normal. Multiple small rounded lesions along the cauda
equina in the upper lumbar spine, measuring up to 6 mm (for example
see series 5, images 8, 10, and 11). In retrospect, these are likely
similar to the prior, but were not as well evaluated due to motion
on that study.

Paraspinal and other soft tissues: Bilateral renal cysts.

Disc levels:

T12-L1: Grade 1 retrolisthesis. Mild disc bulging. Mild bilateral
foraminal stenosis. No significant canal stenosis.

L1-L2: Grade 1 retrolisthesis. Moderate right and mild left
foraminal stenosis. No significant canal stenosis.

L2-L3: Mild disc bulging and endplate spurring. Mildly progressed
mild to moderate left foraminal stenosis. Mild canal stenosis,
mildly progressed.

L3-L4: Grade 1 anterolisthesis. Uncovering the disc with
superimposed broad disc bulge, increased in size. Ligamentum flavum
thickening. Resulting severe canal stenosis with severe right and
moderate left foraminal stenosis. Findings are progressed.

L4-L5: PLIF and posterior decompression. Endplate spurring. Patent
canal. Similar moderate bilateral foraminal stenosis.

L5-S1: PLIF and posterior decompression.  Patent canal and foramina.
IMPRESSION: 1. Progressive adjacent level degenerative change at L3-L4 where
there is severe canal stenosis and severe right and moderate left
foraminal stenosis.
2. L4-S1 PLIF with posterior decompression and no significant canal
stenosis at these levels. Similar moderate bilateral foraminal
stenosis at L4-L5.
3. At L2-L3, mild progression of mild to moderate left foraminal
stenosis and mild canal stenosis.
4. At L1-L2, similar moderate right and mild left foraminal
stenosis.
5. Multiple small rounded lesions along the cauda equina in the
upper lumbar spine, measuring up to 6 mm and most likely nerve
sheath tumors. In retrospect, these are likely similar to the prior,
but were not as well evaluated due to motion on that study.
Postcontrast imaging could confirm and further characterize if
clinically warranted.

## 2021-12-12 DIAGNOSIS — Z681 Body mass index (BMI) 19 or less, adult: Secondary | ICD-10-CM | POA: Diagnosis not present

## 2021-12-12 DIAGNOSIS — M48062 Spinal stenosis, lumbar region with neurogenic claudication: Secondary | ICD-10-CM | POA: Diagnosis not present

## 2021-12-21 ENCOUNTER — Telehealth: Payer: Self-pay | Admitting: *Deleted

## 2021-12-21 NOTE — Telephone Encounter (Signed)
MD rec'd medical clearance for surgery. He states pt need OV. Called pt made appt for 01/08/22 @ 3:20pm../lmb ?

## 2021-12-31 ENCOUNTER — Other Ambulatory Visit: Payer: Self-pay | Admitting: Internal Medicine

## 2022-01-06 ENCOUNTER — Other Ambulatory Visit: Payer: Self-pay | Admitting: Internal Medicine

## 2022-01-08 ENCOUNTER — Encounter: Payer: Self-pay | Admitting: Internal Medicine

## 2022-01-08 ENCOUNTER — Ambulatory Visit (INDEPENDENT_AMBULATORY_CARE_PROVIDER_SITE_OTHER): Payer: Medicare Other | Admitting: Internal Medicine

## 2022-01-08 ENCOUNTER — Ambulatory Visit (INDEPENDENT_AMBULATORY_CARE_PROVIDER_SITE_OTHER): Payer: Medicare Other

## 2022-01-08 VITALS — BP 130/78 | HR 85 | Temp 98.3°F | Ht 72.0 in | Wt 136.0 lb

## 2022-01-08 DIAGNOSIS — E559 Vitamin D deficiency, unspecified: Secondary | ICD-10-CM | POA: Diagnosis not present

## 2022-01-08 DIAGNOSIS — I10 Essential (primary) hypertension: Secondary | ICD-10-CM

## 2022-01-08 DIAGNOSIS — Z01818 Encounter for other preprocedural examination: Secondary | ICD-10-CM

## 2022-01-08 DIAGNOSIS — R059 Cough, unspecified: Secondary | ICD-10-CM | POA: Diagnosis not present

## 2022-01-08 DIAGNOSIS — M48061 Spinal stenosis, lumbar region without neurogenic claudication: Secondary | ICD-10-CM | POA: Insufficient documentation

## 2022-01-08 DIAGNOSIS — E785 Hyperlipidemia, unspecified: Secondary | ICD-10-CM

## 2022-01-08 DIAGNOSIS — R972 Elevated prostate specific antigen [PSA]: Secondary | ICD-10-CM | POA: Diagnosis not present

## 2022-01-08 DIAGNOSIS — J449 Chronic obstructive pulmonary disease, unspecified: Secondary | ICD-10-CM

## 2022-01-08 DIAGNOSIS — M479 Spondylosis, unspecified: Secondary | ICD-10-CM | POA: Insufficient documentation

## 2022-01-08 DIAGNOSIS — M5430 Sciatica, unspecified side: Secondary | ICD-10-CM | POA: Diagnosis not present

## 2022-01-08 LAB — CBC WITH DIFFERENTIAL/PLATELET
Basophils Absolute: 0.1 10*3/uL (ref 0.0–0.1)
Basophils Relative: 0.9 % (ref 0.0–3.0)
Eosinophils Absolute: 0.1 10*3/uL (ref 0.0–0.7)
Eosinophils Relative: 1.6 % (ref 0.0–5.0)
HCT: 42.3 % (ref 39.0–52.0)
Hemoglobin: 14.3 g/dL (ref 13.0–17.0)
Lymphocytes Relative: 39.4 % (ref 12.0–46.0)
Lymphs Abs: 3.5 10*3/uL (ref 0.7–4.0)
MCHC: 33.7 g/dL (ref 30.0–36.0)
MCV: 104 fl — ABNORMAL HIGH (ref 78.0–100.0)
Monocytes Absolute: 0.5 10*3/uL (ref 0.1–1.0)
Monocytes Relative: 5.8 % (ref 3.0–12.0)
Neutro Abs: 4.7 10*3/uL (ref 1.4–7.7)
Neutrophils Relative %: 52.3 % (ref 43.0–77.0)
Platelets: 282 10*3/uL (ref 150.0–400.0)
RBC: 4.07 Mil/uL — ABNORMAL LOW (ref 4.22–5.81)
RDW: 14.5 % (ref 11.5–15.5)
WBC: 9 10*3/uL (ref 4.0–10.5)

## 2022-01-08 LAB — LIPID PANEL
Cholesterol: 199 mg/dL (ref 0–200)
HDL: 61.1 mg/dL (ref >39.00)
LDL Cholesterol: 105 mg/dL — ABNORMAL HIGH (ref 0–99)
NonHDL: 137.6
Total CHOL/HDL Ratio: 3
Triglycerides: 163 mg/dL — ABNORMAL HIGH (ref 0.0–149.0)
VLDL: 32.6 mg/dL (ref 0.0–40.0)

## 2022-01-08 LAB — COMPREHENSIVE METABOLIC PANEL
ALT: 27 U/L (ref 0–53)
AST: 27 U/L (ref 0–37)
Albumin: 4 g/dL (ref 3.5–5.2)
Alkaline Phosphatase: 61 U/L (ref 39–117)
BUN: 14 mg/dL (ref 6–23)
CO2: 27 mEq/L (ref 19–32)
Calcium: 9.3 mg/dL (ref 8.4–10.5)
Chloride: 102 mEq/L (ref 96–112)
Creatinine, Ser: 0.95 mg/dL (ref 0.40–1.50)
GFR: 75.62 mL/min (ref >60.00)
Glucose, Bld: 81 mg/dL (ref 70–99)
Potassium: 4.3 mEq/L (ref 3.5–5.1)
Sodium: 137 mEq/L (ref 135–145)
Total Bilirubin: 0.3 mg/dL (ref 0.2–1.2)
Total Protein: 7 g/dL (ref 6.0–8.3)

## 2022-01-08 LAB — TSH: TSH: 3.32 u[IU]/mL (ref 0.35–5.50)

## 2022-01-08 LAB — PSA: PSA: 5.11 ng/mL — ABNORMAL HIGH (ref 0.10–4.00)

## 2022-01-08 IMAGING — DX DG CHEST 2V
2 series · 2 of 2 positions shown · non-contrast
Comparison: [DATE]

CLINICAL DATA: Cough for 1 week.  Preop clearance.

EXAM:
CHEST - 2 VIEW

[chest pa]
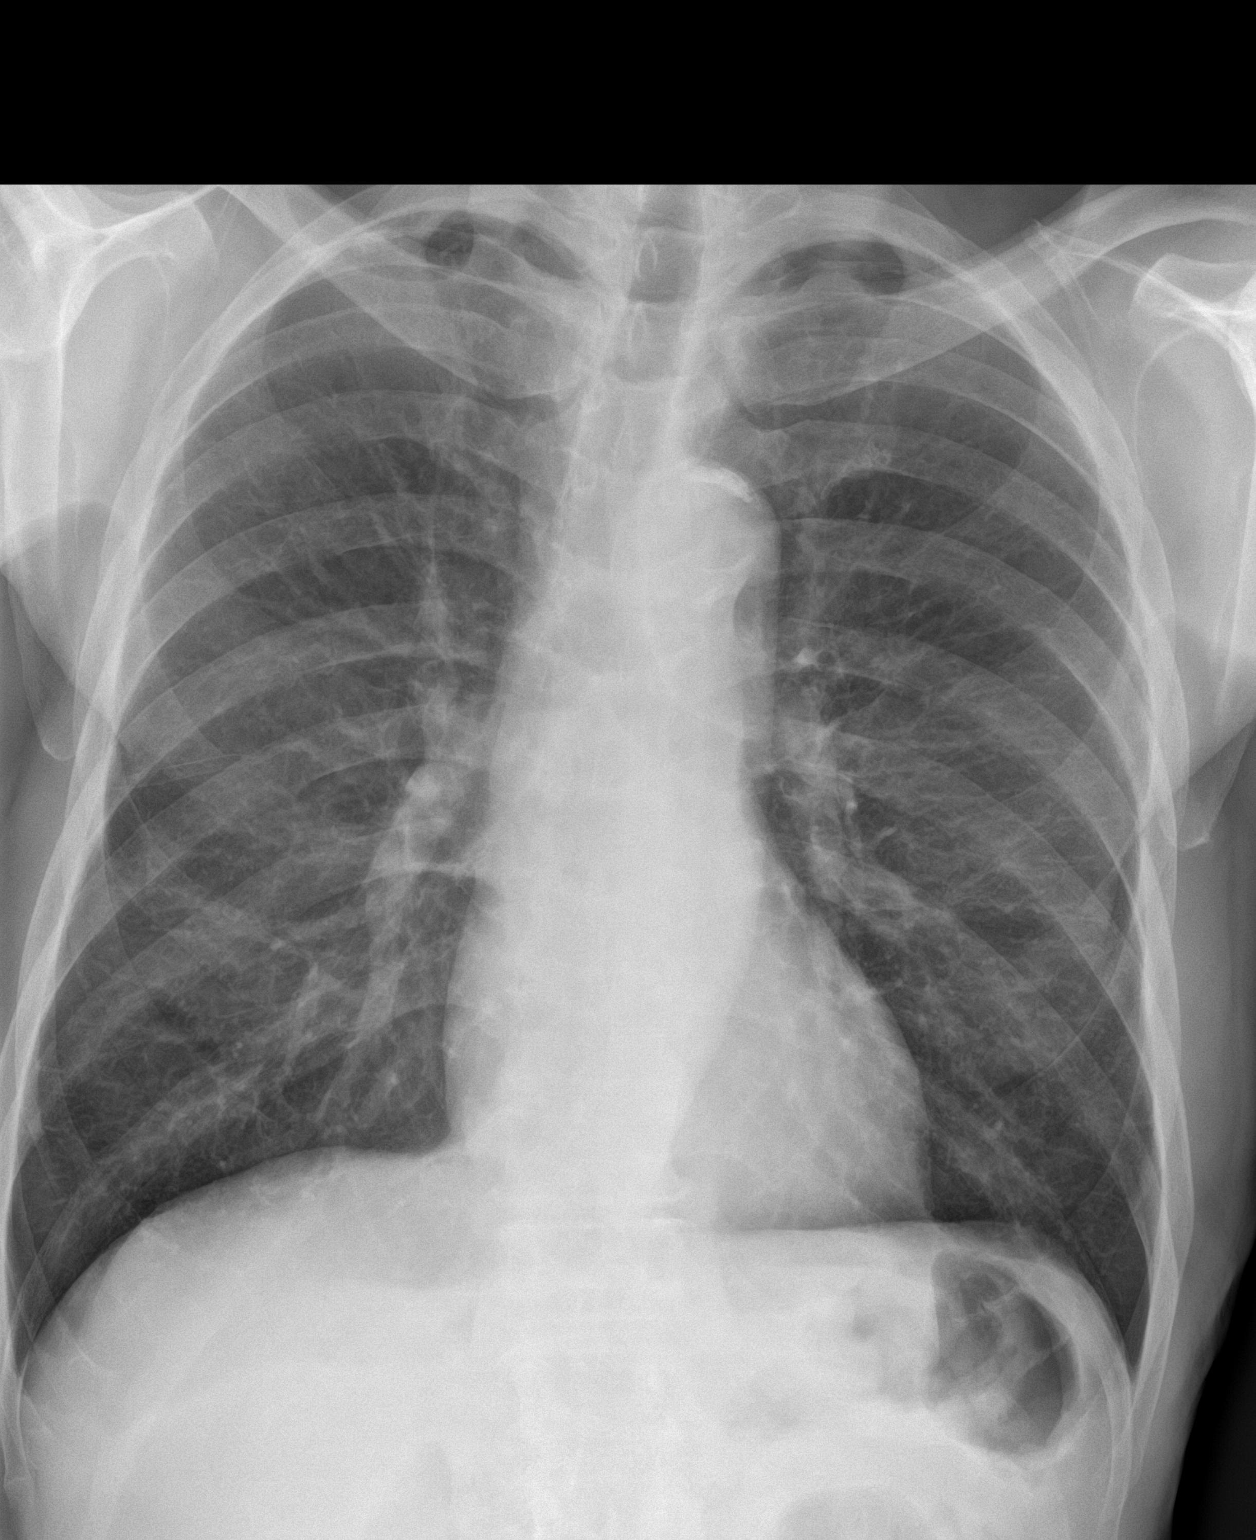

[chest lat]
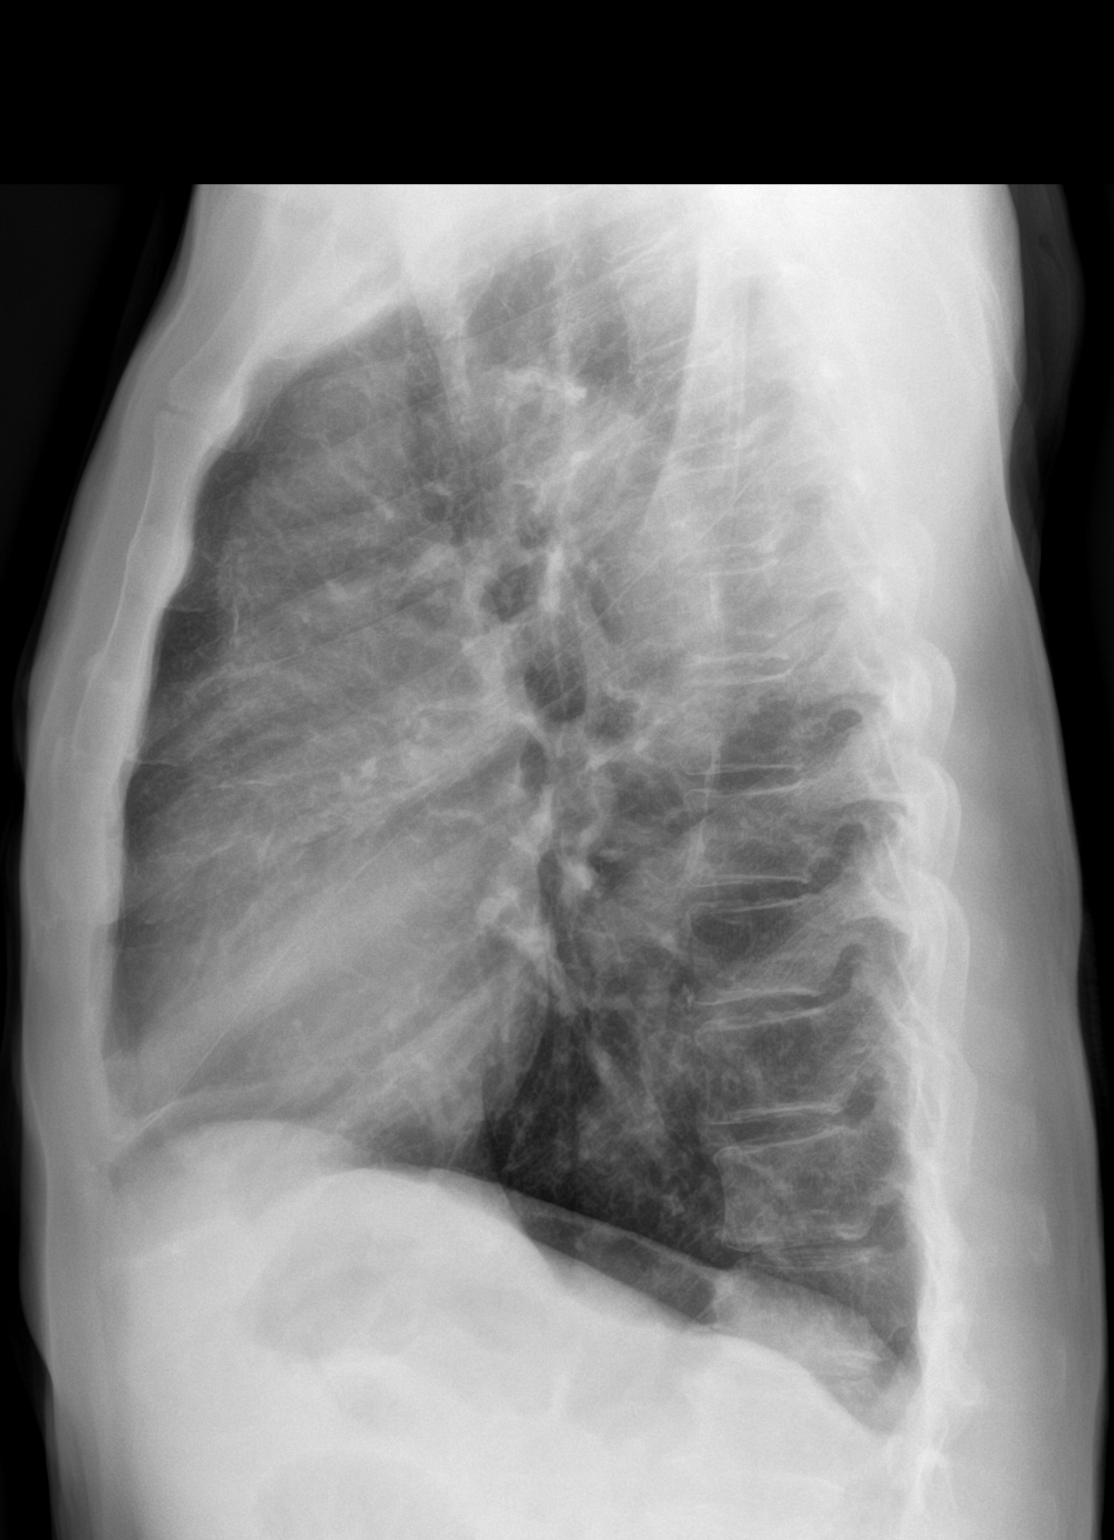

[2 of 2 positions shown; findings below may reference images not displayed]

FINDINGS: The heart size and mediastinal contours are within normal limits.
Both lungs are clear. Lungs are hyperinflated. The visualized
skeletal structures are unremarkable.
IMPRESSION: No active cardiopulmonary disease. COPD.

## 2022-01-08 MED ORDER — ALBUTEROL SULFATE HFA 108 (90 BASE) MCG/ACT IN AERS: 2.0000 | INHALATION_SPRAY | Freq: Four times a day (QID) | RESPIRATORY_TRACT | 3 refills | Status: DC | PRN

## 2022-01-08 NOTE — Assessment & Plan Note (Signed)
Worse ?On Norco 10/325 ?

## 2022-01-08 NOTE — Progress Notes (Signed)
? ?Subjective:  ?Patient ID: Brent Turner, male    DOB: [birth year]  Age: 81 y.o. MRN: [id number] ? ?CC: No chief complaint on file. ? ? ?HPI ?Brent Turner presents for surgical clearance ?Req by Dr Dawley ?Reason: pre-op clearance - spinal fusion ?He is a smoker and continues to smoke.  Follow-up on COPD, hypertension, gout ?  ?Outpatient Medications Prior to Visit  ?Medication Sig Dispense Refill  ? allopurinol (ZYLOPRIM) 300 MG tablet TAKE 1 TABLET BY MOUTH EVERY DAY 90 tablet 2  ? amLODipine (NORVASC) 5 MG tablet Take 1 tablet (5 mg total) by mouth daily. 90 tablet 3  ? Cholecalciferol 1000 UNITS tablet Take 1,000 Units by mouth daily.    ? diazepam (VALIUM) 5 MG tablet SMARTSIG:1-2 Tablet(s) By Mouth PRN    ? diclofenac (VOLTAREN) 50 MG EC tablet Take 50 mg by mouth 2 (two) times daily. Take 50 mg by mouth 2 (two) times daily    ? sildenafil (VIAGRA) 100 MG tablet Take 100 mg by mouth daily as needed.    ? tadalafil (CIALIS) 10 MG tablet Take 1 tablet (10 mg total) by mouth every other day as needed for erectile dysfunction. 10 tablet 3  ? tiZANidine (ZANAFLEX) 4 MG tablet Take 1 tablet (4 mg total) by mouth every 6 (six) hours as needed for muscle spasms. 30 tablet 1  ? HYDROcodone-acetaminophen (NORCO) 5-325 MG tablet Take 1 tablet by mouth every 8 (eight) hours as needed for severe pain. 90 tablet 0  ? tamsulosin (FLOMAX) 0.4 MG CAPS capsule Take 1 capsule (0.4 mg total) by mouth daily. Follow-up appt w/ labs are due must see MD for refills 30 capsule 0  ? ?No facility-administered medications prior to visit.  ? ? ?ROS: ?Review of Systems  ?Constitutional:  Negative for appetite change, fatigue and unexpected weight change.  ?HENT:  Negative for congestion, nosebleeds, sneezing, sore throat and trouble swallowing.   ?Eyes:  Negative for itching and visual disturbance.  ?Respiratory:  Negative for cough.   ?Cardiovascular:  Negative for chest pain, palpitations and leg swelling.  ?Gastrointestinal:  Negative  for abdominal distention, blood in stool, diarrhea and nausea.  ?Genitourinary:  Negative for frequency and hematuria.  ?Musculoskeletal:  Positive for back pain and gait problem. Negative for joint swelling and neck pain.  ?Skin:  Negative for rash.  ?Neurological:  Positive for weakness. Negative for dizziness, tremors and speech difficulty.  ?Psychiatric/Behavioral:  Negative for agitation, dysphoric mood and sleep disturbance. The patient is not nervous/anxious.   ? ?Objective:  ?BP 130/78 (BP Location: Left Arm, Patient Position: Sitting, Cuff Size: Normal)   Pulse 85   Temp 98.3 ?F (36.8 ?C) (Oral)   Ht 6' (1.829 m)   Wt 136 lb (61.7 kg)   SpO2 94%   BMI 18.44 kg/m?  ? ?BP Readings from Last 3 Encounters:  ?01/08/22 130/78  ?10/18/21 134/74  ?09/10/21 (!) 152/82  ? ? ?Wt Readings from Last 3 Encounters:  ?01/08/22 136 lb (61.7 kg)  ?10/18/21 142 lb (64.4 kg)  ?09/10/21 142 lb (64.4 kg)  ? ? ?Physical Exam ?Constitutional:   ?   General: He is not in acute distress. ?   Appearance: He is well-developed.  ?   Comments: NAD  ?Eyes:  ?   Conjunctiva/sclera: Conjunctivae normal.  ?   Pupils: Pupils are equal, round, and reactive to light.  ?Neck:  ?   Thyroid: No thyromegaly.  ?   Vascular: No JVD.  ?Cardiovascular:  ?  Rate and Rhythm: Normal rate and regular rhythm.  ?   Heart sounds: Normal heart sounds. No murmur heard. ?  No friction rub. No gallop.  ?Pulmonary:  ?   Effort: Pulmonary effort is normal. No respiratory distress.  ?   Breath sounds: Normal breath sounds. No wheezing or rales.  ?Chest:  ?   Chest wall: No tenderness.  ?Abdominal:  ?   General: Bowel sounds are normal. There is no distension.  ?   Palpations: Abdomen is soft. There is no mass.  ?   Tenderness: There is no abdominal tenderness. There is no guarding or rebound.  ?Musculoskeletal:     ?   General: Tenderness present. Normal range of motion.  ?   Cervical back: Normal range of motion.  ?Lymphadenopathy:  ?   Cervical: No  cervical adenopathy.  ?Skin: ?   General: Skin is warm and dry.  ?   Findings: No rash.  ?Neurological:  ?   Mental Status: He is alert and oriented to person, place, and time.  ?   Cranial Nerves: No cranial nerve deficit.  ?   Motor: No abnormal muscle tone.  ?   Coordination: Coordination normal.  ?   Gait: Gait normal.  ?   Deep Tendon Reflexes: Reflexes are normal and symmetric.  ?Psychiatric:     ?   Behavior: Behavior normal.     ?   Thought Content: Thought content normal.     ?   Judgment: Judgment normal.  ? ?Lumbar spine is painful range of motion ? ? ? A total time of 45 minutes was spent preparing to see the patient, reviewing tests, x-rays, operative reports and other medical records.  Also, obtaining history and performing comprehensive physical exam.  Additionally, counseling the patient regarding the above listed issues.   Finally, documenting clinical information in the health records, coordination of care, educating the patient regarding smoking, preop clearance, hypertension, gout. ? ?Lab Results  ?Component Value Date  ? WBC 9.0 01/08/2022  ? HGB 14.3 01/08/2022  ? HCT 42.3 01/08/2022  ? PLT 282.0 01/08/2022  ? GLUCOSE 81 01/08/2022  ? CHOL 199 01/08/2022  ? TRIG 163.0 (H) 01/08/2022  ? HDL 61.10 01/08/2022  ? LDLDIRECT 106.0 02/14/2020  ? LDLCALC 105 (H) 01/08/2022  ? ALT 27 01/08/2022  ? AST 27 01/08/2022  ? NA 137 01/08/2022  ? K 4.3 01/08/2022  ? CL 102 01/08/2022  ? CREATININE 0.95 01/08/2022  ? BUN 14 01/08/2022  ? CO2 27 01/08/2022  ? TSH 3.32 01/08/2022  ? PSA 5.11 (H) 01/08/2022  ? HGBA1C 7.0 (H) 07/09/2017  ? ? ?MR LUMBAR SPINE WO CONTRAST ? ?Result Date: 11/28/2021 ?CLINICAL DATA:  Disc bulging and bilateral facet arthropathy with EXAM: MRI LUMBAR SPINE WITHOUT CONTRAST TECHNIQUE: Multiplanar, multisequence MR imaging of the lumbar spine was performed. No intravenous contrast was administered. COMPARISON:  MRI lumbar spine 03/30/2020. FINDINGS: Segmentation: Standard segmentation is  assumed. The inferior-most fully formed intervertebral disc is labeled L5-S1. Alignment: Grade 1 anterolisthesis of T12 on L1 and L1 on L2. Grade 1 anterolisthesis of L3 on L4, progressed. Vertebrae: L4-L5 PLIF with chronic appearing degenerative/discogenic endplate signal changes. Milder degenerative/discogenic endplate signal changes at other levels, including tear early at T12-L1 and L2-L3. No specific evidence of acute fracture discitis/osteomyelitis. No suspicious bone lesions. Schmorl's node along the superior L3 endplate, new. Conus medullaris and cauda equina: Conus extends to the L1 level. Conus appears normal. Multiple small rounded lesions along the  cauda equina in the upper lumbar spine, measuring up to 6 mm (for example see series 5, images 8, 10, and 11). In retrospect, these are likely similar to the prior, but were not as well evaluated due to motion on that study. Paraspinal and other soft tissues: Bilateral renal cysts. Disc levels: T12-L1: Grade 1 retrolisthesis. Mild disc bulging. Mild bilateral foraminal stenosis. No significant canal stenosis. L1-L2: Grade 1 retrolisthesis. Moderate right and mild left foraminal stenosis. No significant canal stenosis. L2-L3: Mild disc bulging and endplate spurring. Mildly progressed mild to moderate left foraminal stenosis. Mild canal stenosis, mildly progressed. L3-L4: Grade 1 anterolisthesis. Uncovering the disc with superimposed broad disc bulge, increased in size. Ligamentum flavum thickening. Resulting severe canal stenosis with severe right and moderate left foraminal stenosis. Findings are progressed. L4-L5: PLIF and posterior decompression. Endplate spurring. Patent canal. Similar moderate bilateral foraminal stenosis. L5-S1: PLIF and posterior decompression.  Patent canal and foramina. IMPRESSION: 1. Progressive adjacent level degenerative change at L3-L4 where there is severe canal stenosis and severe right and moderate left foraminal stenosis. 2.  L4-S1 PLIF with posterior decompression and no significant canal stenosis at these levels. Similar moderate bilateral foraminal stenosis at L4-L5. 3. At L2-L3, mild progression of mild to moderate left foraminal stenos

## 2022-01-08 NOTE — Assessment & Plan Note (Signed)
Clear for surgery

## 2022-01-08 NOTE — Assessment & Plan Note (Signed)
1 ppd smoker ?Albuterol post-op ?

## 2022-01-08 NOTE — Assessment & Plan Note (Signed)
On Vit D 

## 2022-01-09 LAB — URINALYSIS
Bilirubin Urine: NEGATIVE
Hgb urine dipstick: NEGATIVE
Ketones, ur: NEGATIVE
Leukocytes,Ua: NEGATIVE
Nitrite: NEGATIVE
Specific Gravity, Urine: 1.02 (ref 1.000–1.030)
Total Protein, Urine: NEGATIVE
Urine Glucose: NEGATIVE
Urobilinogen, UA: 0.2 (ref 0.0–1.0)
pH: 6 (ref 5.0–8.0)

## 2022-01-15 ENCOUNTER — Other Ambulatory Visit: Payer: Self-pay | Admitting: Internal Medicine

## 2022-02-19 ENCOUNTER — Other Ambulatory Visit: Payer: Self-pay | Admitting: Neurological Surgery

## 2022-02-26 ENCOUNTER — Other Ambulatory Visit: Payer: Self-pay | Admitting: Neurological Surgery

## 2022-03-19 ENCOUNTER — Encounter: Payer: Self-pay | Admitting: Internal Medicine

## 2022-03-19 ENCOUNTER — Ambulatory Visit (INDEPENDENT_AMBULATORY_CARE_PROVIDER_SITE_OTHER): Payer: Medicare Other | Admitting: Internal Medicine

## 2022-03-19 DIAGNOSIS — N529 Male erectile dysfunction, unspecified: Secondary | ICD-10-CM

## 2022-03-19 DIAGNOSIS — M5430 Sciatica, unspecified side: Secondary | ICD-10-CM | POA: Diagnosis not present

## 2022-03-19 DIAGNOSIS — I10 Essential (primary) hypertension: Secondary | ICD-10-CM

## 2022-03-19 DIAGNOSIS — R634 Abnormal weight loss: Secondary | ICD-10-CM

## 2022-03-19 MED ORDER — SILDENAFIL CITRATE 100 MG PO TABS: 50.0000 mg | ORAL_TABLET | Freq: Every day | ORAL | 11 refills | Status: DC | PRN

## 2022-03-19 MED ORDER — HYDROCODONE-ACETAMINOPHEN 10-325 MG PO TABS: 1.0000 | ORAL_TABLET | Freq: Four times a day (QID) | ORAL | 0 refills | Status: DC | PRN

## 2022-03-19 MED ORDER — AMLODIPINE BESYLATE 5 MG PO TABS
5.0000 mg | ORAL_TABLET | Freq: Every day | ORAL | 3 refills | Status: DC
Start: 2022-03-19 — End: 2023-04-07

## 2022-03-19 NOTE — Assessment & Plan Note (Signed)
Worse Surgery is pending next week - Dr Renato Gails as needed  Potential benefits of a short term opioids use as well as potential risks (i.e. addiction risk, apnea etc) and complications (i.e. Somnolence, constipation and others) were explained to the patient and were aknowledged.

## 2022-03-19 NOTE — Assessment & Plan Note (Signed)
Organic, chronic  Prescribed Viagra as needed for later

## 2022-03-19 NOTE — Assessment & Plan Note (Signed)
Use Ensure, Boost  Smoke less

## 2022-03-19 NOTE — Assessment & Plan Note (Signed)
Chronic Cont on Amlodipine

## 2022-03-19 NOTE — Patient Instructions (Addendum)
Grief Connects [place]: A Neurosurgeon's Lessons on Love, Loss, and Compassion Hardcover - Jan 11, 2020 by Brent Turner. Brent Turner Environmental education officer), Brent Turner (Foreword)    Editorial Reviews Review "Many of [place] build walls that are impenetrable, but few of [place] are immune to the power of grief. By interweaving his own vulnerability and suffering with those of other patients and physicians, [name] makes [place] understand that only through empathy and compassion can we truly connect. Powerful, profound, and compelling."?Brent Turner. Brent Farber, MD, Professor of Neurosurgery and founder of the [employer] for Compassion and Chiropodist and Education, [employer] of Medicine, [place] Times bestselling Chartered loss adjuster of Brent Turner: A Neurosurgeon's Quest to Hydrologist of the Brain and the Mysteries of the Heart  "In Grief Connects [place], Brent Turner dissects the heart-wrenching illnesses of people close to him, and in so doing dismantles the emotional armor those of [place] in medicine unwittingly don, to accompany his patients in their suffering and feel with them. A transformative read."?Brent Turner Listen, MD, MS, author of When Blood Breaks Down: Life Lessons from Leukemia and essayist for the [employer]  "Every patient and medical professional who meets the line that separates them will understand when it needs to be dissolved to open the gateway for empathy and compassion. Grief Connects [place] is an essential guide and inspiration in these challenging times."?Brent Reeks, MD, Brent Turner of The Empathy Effect, Associate Professor of Psychiatry, [facility]  "A grieving brother, an Banker. As Brent Turner lives through his sister's death, we see him grow Brent his human-ness. And as [name] examines his profession, he detects and prescribes compassion. This is a brave tale, and the teller is broken, open, yearning, and true."?Brent Turner, Brent Turner of Wit  "[name] gives [place] the gift his sister and her  family gave to him: insights Brent what it means to traverse illness and the quandaries of the healthcare system, to hold hope and despair in the same hand. And, importantly, he allows [place] Brent the quiet and under-appreciated zone of being a caregiver, a zone with its own forces and triumphs and miseries unlike any other."?Brent Norrie, MD, hospice and palliative medicine physician and Brent Turner of A Beginner's Guide to the End: Practical Advice for Living Life and Facing Death  "Grief Connects [place] is the moving account of Dr. Fredrich Turner transformation from reserved neurosurgeon to the kind of compassionate physician we all hope for. His book is a heartbreaking yet inspiring call for a more courageous relationship between patients and doctors, one marked by openness, mutual respect, and the acknowledgment of our common humanity."?Brent Channel, MD, physician and [place] Times bestselling Brent Turner of Final Exam: A Surgeon's Reflections on Mortality  "Grief Connects [place] is a beautiful book and an important one. The way [name] writes about illness, hospitals, diagnoses?all the things clouding collective consciousness?from the dual perspective of expertise and lived experience is particularly timely and urgent."?Brent Turner, author of Good Grief and co-founder of the [employer] ([place])  About the Brent Quan, MD is a partner in the country's largest neurosurgical group practice, [facility], and practices neurosurgery at the [employer]. [facility], the flagship hospital of [facility] [place]. [name] has published three recent essays in the [place] Times: "Dying in the Neurosurgical I.C.U" (September 15, 2018), "Moral Distress in Neurosurgery" (April 16, 2018), and "Grief As My Guide: How My Sister Made Me a Better Doctor" (  May 16, 2017). Visit [name] at [email or web address]. Product details Publisher ? : ? [employer] (Jan 11, 2020) Language ? : ? English Hardcover ? : ? 304 pages ISBN-10 ? : ? [id number] ISBN-13 ? : ? [id number] Item Weight ? : ? 1.23 pounds Dimensions ? : ? 6 x 0.85 x 9 inches Brent Turner Rank: [phone] in Books (See Top 100 in Books) #199 in Doctor-Patient Relations #[phone] in Medical Professional Biographies #1,502 in Grief & Bereavement Customer Reviews: 4.8 4.8 out of 5 stars    84 ratings Videos

## 2022-03-19 NOTE — Progress Notes (Signed)
Subjective:  Patient ID: Brent Turner, male    DOB: 08-15-1941  Age: 81 y.o. MRN: [id number]  CC: Follow-up (2 month followup)   HPI Venancio Poisson Locascio presents for severe LBP - out of pain meds Surgery is pending next week - Dr Jake Samples C/o HTN, ED   Outpatient Medications Prior to Visit  Medication Sig Dispense Refill   albuterol (VENTOLIN HFA) 108 (90 Base) MCG/ACT inhaler Inhale 2 puffs into the lungs every 6 (six) hours as needed for wheezing or shortness of breath. 18 g 3   allopurinol (ZYLOPRIM) 300 MG tablet TAKE 1 TABLET BY MOUTH EVERY DAY 90 tablet 2   Cholecalciferol 1000 UNITS tablet Take 1,000 Units by mouth daily.     tamsulosin (FLOMAX) 0.4 MG CAPS capsule TAKE 1 CAPSULE BY MOUTH DAILY *NEED APPOINTMENT WITH LABS FOR REFILLS* 90 capsule 1   amLODipine (NORVASC) 5 MG tablet Take 1 tablet (5 mg total) by mouth daily. 90 tablet 3   HYDROcodone-acetaminophen (NORCO) 10-325 MG tablet Take 1 tablet by mouth every 6 (six) hours as needed for moderate pain.     tadalafil (CIALIS) 10 MG tablet Take 1 tablet (10 mg total) by mouth every other day as needed for erectile dysfunction. 10 tablet 3   No facility-administered medications prior to visit.    ROS: Review of Systems  Constitutional:  Positive for fatigue and unexpected weight change. Negative for appetite change.  HENT:  Negative for congestion, nosebleeds, sneezing, sore throat and trouble swallowing.   Eyes:  Negative for itching and visual disturbance.  Respiratory:  Negative for cough.   Cardiovascular:  Negative for chest pain, palpitations and leg swelling.  Gastrointestinal:  Negative for abdominal distention, blood in stool, diarrhea and nausea.  Genitourinary:  Negative for frequency and hematuria.  Musculoskeletal:  Positive for back pain and gait problem. Negative for joint swelling, neck pain and neck stiffness.  Skin:  Negative for rash.  Neurological:  Positive for weakness. Negative for dizziness, tremors and  speech difficulty.  Psychiatric/Behavioral:  Positive for dysphoric mood. Negative for agitation, sleep disturbance and suicidal ideas. The patient is not nervous/anxious.     Objective:  BP 136/80 (BP Location: Left Arm, Patient Position: Sitting, Cuff Size: Small)   Pulse 95   Temp 98.1 F (36.7 C) (Oral)   Ht 6' (1.829 m)   Wt 134 lb (60.8 kg)   SpO2 95%   BMI 18.17 kg/m   BP Readings from Last 3 Encounters:  03/19/22 136/80  01/08/22 130/78  10/18/21 134/74    Wt Readings from Last 3 Encounters:  03/19/22 134 lb (60.8 kg)  01/08/22 136 lb (61.7 kg)  10/18/21 142 lb (64.4 kg)    Physical Exam Constitutional:      General: He is not in acute distress.    Appearance: Normal appearance. He is well-developed.     Comments: NAD  Eyes:     Conjunctiva/sclera: Conjunctivae normal.     Pupils: Pupils are equal, round, and reactive to light.  Neck:     Thyroid: No thyromegaly.     Vascular: No JVD.  Cardiovascular:     Rate and Rhythm: Normal rate and regular rhythm.     Heart sounds: Normal heart sounds. No murmur heard.    No friction rub. No gallop.  Pulmonary:     Effort: Pulmonary effort is normal. No respiratory distress.     Breath sounds: Normal breath sounds. No wheezing or rales.  Chest:  Chest wall: No tenderness.  Abdominal:     General: Bowel sounds are normal. There is no distension.     Palpations: Abdomen is soft. There is no mass.     Tenderness: There is no abdominal tenderness. There is no guarding or rebound.  Musculoskeletal:        General: Tenderness present. Normal range of motion.     Cervical back: Normal range of motion.  Lymphadenopathy:     Cervical: No cervical adenopathy.  Skin:    General: Skin is warm and dry.     Findings: No rash.  Neurological:     Mental Status: He is alert and oriented to person, place, and time.     Cranial Nerves: No cranial nerve deficit.     Motor: No abnormal muscle tone.     Coordination:  Coordination normal.     Gait: Gait normal.     Deep Tendon Reflexes: Reflexes are normal and symmetric.  Psychiatric:        Behavior: Behavior normal.        Thought Content: Thought content normal.        Judgment: Judgment normal.   Using a cane Antalgic gait Thin  Lab Results  Component Value Date   WBC 9.0 01/08/2022   HGB 14.3 01/08/2022   HCT 42.3 01/08/2022   PLT 282.0 01/08/2022   GLUCOSE 81 01/08/2022   CHOL 199 01/08/2022   TRIG 163.0 (H) 01/08/2022   HDL 61.10 01/08/2022   LDLDIRECT 106.0 02/14/2020   LDLCALC 105 (H) 01/08/2022   ALT 27 01/08/2022   AST 27 01/08/2022   NA 137 01/08/2022   K 4.3 01/08/2022   CL 102 01/08/2022   CREATININE 0.95 01/08/2022   BUN 14 01/08/2022   CO2 27 01/08/2022   TSH 3.32 01/08/2022   PSA 5.11 (H) 01/08/2022   HGBA1C 7.0 (H) 07/09/2017    MR LUMBAR SPINE WO CONTRAST  Result Date: 11/28/2021 CLINICAL DATA:  Disc bulging and bilateral facet arthropathy with EXAM: MRI LUMBAR SPINE WITHOUT CONTRAST TECHNIQUE: Multiplanar, multisequence MR imaging of the lumbar spine was performed. No intravenous contrast was administered. COMPARISON:  MRI lumbar spine 03/30/2020. FINDINGS: Segmentation: Standard segmentation is assumed. The inferior-most fully formed intervertebral disc is labeled L5-S1. Alignment: Grade 1 anterolisthesis of T12 on L1 and L1 on L2. Grade 1 anterolisthesis of L3 on L4, progressed. Vertebrae: L4-L5 PLIF with chronic appearing degenerative/discogenic endplate signal changes. Milder degenerative/discogenic endplate signal changes at other levels, including tear early at T12-L1 and L2-L3. No specific evidence of acute fracture discitis/osteomyelitis. No suspicious bone lesions. Schmorl's node along the superior L3 endplate, new. Conus medullaris and cauda equina: Conus extends to the L1 level. Conus appears normal. 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 In retrospect, these are likely similar to the prior, but were not as well evaluated due to motion on that study. Paraspinal and other soft tissues: Bilateral renal cysts. Disc levels: T12-L1: Grade 1 retrolisthesis. Mild disc bulging. Mild bilateral foraminal stenosis. No significant canal stenosis. L1-L2: Grade 1 retrolisthesis. Moderate right and mild left foraminal stenosis. No significant canal stenosis. L2-L3: Mild disc bulging and endplate spurring. Mildly progressed mild to moderate left foraminal stenosis. Mild canal stenosis, mildly progressed. L3-L4: Grade 1 anterolisthesis. Uncovering the disc with superimposed broad disc bulge, increased in size. Ligamentum flavum thickening. Resulting severe canal stenosis with severe  right and moderate left foraminal stenosis. Findings are progressed. L4-L5: PLIF and posterior decompression. Endplate spurring. Patent canal. Similar moderate bilateral foraminal stenosis. L5-S1: PLIF and posterior decompression.  Patent canal and foramina. IMPRESSION: 1. Progressive adjacent level degenerative change at L3-L4 where there is severe canal stenosis and severe right and moderate left foraminal stenosis. 2. L4-S1 PLIF with posterior decompression and no significant canal stenosis at these levels. Similar moderate bilateral foraminal stenosis at L4-L5. 3. At L2-L3, mild progression of mild to moderate left foraminal stenosis and mild canal stenosis. 4. At L1-L2, similar moderate right and mild left foraminal stenosis. 5. 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 In retrospect, these are likely similar to the prior, but were not as well evaluated due to motion on that study. Postcontrast imaging could confirm and further characterize if clinically warranted. Electronically Signed   By: Feliberto Harts M.D.   On: 11/28/2021 15:03     Assessment & Plan:   Problem List Items Addressed This Visit     ERECTILE DYSFUNCTION    Organic, chronic  Prescribed Viagra as needed for later      Essential hypertension    Chronic Cont on Amlodipine      Relevant Medications   amLODipine (NORVASC) 5 MG tablet   sildenafil (VIAGRA) 100 MG tablet   Sciatic leg pain    Worse Surgery is pending next week - Dr Renato Gails as needed  Potential benefits of a short term opioids use as well as potential risks (i.e. addiction risk, apnea etc) and complications (i.e. Somnolence, constipation and others) were explained to the patient and were aknowledged.      Weight loss    Use Ensure, Boost  Smoke less         Meds ordered this encounter  Medications   amLODipine (NORVASC) 5 MG tablet    Sig: Take 1 tablet (5 mg total) by mouth daily.    Dispense:  90 tablet    Refill:  3   HYDROcodone-acetaminophen (NORCO) 10-325 MG tablet    Sig: Take 1 tablet by mouth every 6 (six) hours as needed for moderate pain.    Dispense:  100 tablet    Refill:  0   sildenafil (VIAGRA) 100 MG tablet    Sig: Take 0.5-1 tablets (50-100 mg total) by mouth daily as needed for erectile dysfunction.    Dispense:  12 tablet    Refill:  11      Follow-up: No follow-ups on file.  Sonda Primes, MD

## 2022-03-20 ENCOUNTER — Encounter (HOSPITAL_COMMUNITY): Payer: Self-pay

## 2022-03-20 ENCOUNTER — Encounter (HOSPITAL_COMMUNITY)
Admission: RE | Admit: 2022-03-20 | Discharge: 2022-03-20 | Disposition: A | Discharge status: Home / Self Care | Payer: Medicare Other | Source: Ambulatory Visit | Attending: Neurological Surgery | Admitting: Neurological Surgery

## 2022-03-20 ENCOUNTER — Other Ambulatory Visit: Payer: Self-pay

## 2022-03-20 VITALS — BP 137/80 | HR 83 | Temp 98.1°F | Resp 17 | Ht 72.0 in | Wt 135.4 lb

## 2022-03-20 DIAGNOSIS — Z01818 Encounter for other preprocedural examination: Secondary | ICD-10-CM | POA: Diagnosis not present

## 2022-03-20 LAB — CBC
HCT: 39 % (ref 39.0–52.0)
Hemoglobin: 13.8 g/dL (ref 13.0–17.0)
MCH: 35.7 pg — ABNORMAL HIGH (ref 26.0–34.0)
MCHC: 35.4 g/dL (ref 30.0–36.0)
MCV: 100.8 fL — ABNORMAL HIGH (ref 80.0–100.0)
Platelets: 253 10*3/uL (ref 150–400)
RBC: 3.87 MIL/uL — ABNORMAL LOW (ref 4.22–5.81)
RDW: 14.4 % (ref 11.5–15.5)
WBC: 8.1 10*3/uL (ref 4.0–10.5)
nRBC: 0 % (ref 0.0–0.2)

## 2022-03-20 LAB — BASIC METABOLIC PANEL
Anion gap: 7 (ref 5–15)
BUN: 11 mg/dL (ref 8–23)
CO2: 27 mmol/L (ref 22–32)
Calcium: 9.5 mg/dL (ref 8.9–10.3)
Chloride: 108 mmol/L (ref 98–111)
Creatinine, Ser: 0.85 mg/dL (ref 0.61–1.24)
GFR, Estimated: >60 mL/min (ref >60)
Glucose, Bld: 96 mg/dL (ref 70–99)
Potassium: 3.9 mmol/L (ref 3.5–5.1)
Sodium: 142 mmol/L (ref 135–145)

## 2022-03-20 LAB — TYPE AND SCREEN
ABO/RH(D): O POS
Antibody Screen: NEGATIVE

## 2022-03-20 LAB — SURGICAL PCR SCREEN
MRSA, PCR: NEGATIVE
Staphylococcus aureus: NEGATIVE

## 2022-03-20 NOTE — Pre-Procedure Instructions (Signed)
Surgical Instructions    Your procedure is scheduled on Tuesday, July 25th.  Report to [facility] Main Entrance "A" at 05:30 A.M., then check in with the Admitting office.  Call this number if you have problems the morning of surgery:  [phone]   If you have any questions prior to your surgery date call [phone]: Open Monday-Friday 8am-4pm    Remember:  Do not eat or drink after midnight the night before your surgery      Take these medicines the morning of surgery with A SIP OF WATER  albuterol (VENTOLIN HFA)- if needed, bring with you on the day of surgery  allopurinol (ZYLOPRIM) amLODipine (NORVASC)  HYDROcodone-acetaminophen (NORCO)- if needed tamsulosin (FLOMAX)   As of today, STOP taking any Aspirin (unless otherwise instructed by your surgeon) Aleve, Naproxen, Ibuprofen, Motrin, Advil, Goody's, BC's, all herbal medications, fish oil, and all vitamins.                     Do NOT Smoke (Tobacco/Vaping) for 24 hours prior to your procedure.  If you use a CPAP at night, you may bring your mask/headgear for your overnight stay.   Contacts, glasses, piercing's, hearing aid's, dentures or partials may not be worn into surgery, please bring cases for these belongings.    For patients admitted to the hospital, discharge time will be determined by your treatment team.   Patients discharged the day of surgery will not be allowed to drive home, and someone needs to stay with them for 24 hours.  SURGICAL WAITING ROOM VISITATION Patients having surgery or a procedure may have no more than 2 support people in the waiting area - these visitors may rotate.   Children under the age of 32 must have an adult with them who is not the patient. If the patient needs to stay at the hospital during part of their recovery, the visitor guidelines for inpatient rooms apply. Pre-op nurse will coordinate an appropriate time for 1 support person to accompany patient in pre-op.  This support  person may not rotate.   Please refer to the [facility] website for the visitor guidelines for Inpatients (after your surgery is over and you are in a regular room).    Special instructions:   - Preparing For Surgery  Before surgery, you can play an important role. Because skin is not sterile, your skin needs to be as free of germs as possible. You can reduce the number of germs on your skin by washing with CHG (chlorahexidine gluconate) Soap before surgery.  CHG is an antiseptic cleaner which kills germs and bonds with the skin to continue killing germs even after washing.    Oral Hygiene is also important to reduce your risk of infection.  Remember - BRUSH YOUR TEETH THE MORNING OF SURGERY WITH YOUR REGULAR TOOTHPASTE  Please do not use if you have an allergy to CHG or antibacterial soaps. If your skin becomes reddened/irritated stop using the CHG.  Do not shave (including legs and underarms) for at least 48 hours prior to first CHG shower. It is OK to shave your face.  Please follow these instructions carefully.   Shower the NIGHT BEFORE SURGERY and the MORNING OF SURGERY  If you chose to wash your hair, wash your hair first as usual with your normal shampoo.  After you shampoo, rinse your hair and body thoroughly to remove the shampoo.  Use CHG Soap as you would any other liquid soap. You can  apply CHG directly to the skin and wash gently with a scrungie or a clean washcloth.   Apply the CHG Soap to your body ONLY FROM THE NECK DOWN.  Do not use on open wounds or open sores. Avoid contact with your eyes, ears, mouth and genitals (private parts). Wash Face and genitals (private parts)  with your normal soap.   Wash thoroughly, paying special attention to the area where your surgery will be performed.  Thoroughly rinse your body with warm water from the neck down.  DO NOT shower/wash with your normal soap after using and rinsing off the CHG Soap.  Pat yourself dry with a  CLEAN TOWEL.  Wear CLEAN PAJAMAS to bed the night before surgery  Place CLEAN SHEETS on your bed the night before your surgery  DO NOT SLEEP WITH PETS.   Day of Surgery: Take a shower with CHG soap. Do not wear jewelry  Do not wear lotions, powders, colognes, or deodorant.  Men may shave face and neck. Do not bring valuables to the hospital. [facility] is not responsible for any belongings or valuables.  Wear Clean/Comfortable clothing the morning of surgery Remember to brush your teeth WITH YOUR REGULAR TOOTHPASTE.   Please read over the following fact sheets that you were given.    If you received a COVID test during your pre-op visit  it is requested that you wear a mask when out in public, stay away from anyone that may not be feeling well and notify your surgeon if you develop symptoms. If you have been in contact with anyone that has tested positive in the last 10 days please notify you surgeon.

## 2022-03-20 NOTE — Progress Notes (Signed)
PCP - Dr.[name] Cardiologist - denies  PPM/ICD - denies Device Orders -  Rep Notified -   Chest x-ray - na EKG - 03/20/22 Stress Test -denies ECHO - 03/10/07 Cardiac Cath - denies  Sleep Study - denies CPAP -   Fasting Blood Sugar - na Checks Blood Sugar _____ times a day  Blood Thinner Instructions:na Aspirin Instructions:na  ERAS Protcol -no PRE-SURGERY Ensure or G2-   COVID TEST- na   Anesthesia review: no  Patient denies shortness of breath, fever, cough and chest pain at PAT appointment   All instructions explained to the patient, with a verbal understanding of the material. Patient agrees to go over the instructions while at home for a better understanding. Patient also instructed to wear a mask when out in publicprior to surgery . The opportunity to ask questions was provided.

## 2022-03-25 NOTE — Anesthesia Preprocedure Evaluation (Signed)
Anesthesia Evaluation  Patient identified by MRN, date of birth, ID band Patient awake    Reviewed: Allergy & Precautions, NPO status , Patient's Chart, lab work & pertinent test results  History of Anesthesia Complications Negative for: history of anesthetic complications  Airway Mallampati: I  TM Distance: >3 FB Neck ROM: Full    Dental  (+) Dental Advisory Given, Edentulous Upper, Edentulous Lower   Pulmonary COPD, Current Smoker and Patient abstained from smoking.,    Pulmonary exam normal        Cardiovascular hypertension, Pt. on medications Normal cardiovascular exam     Neuro/Psych  Neuromuscular disease    GI/Hepatic negative GI ROS, Neg liver ROS,   Endo/Other  negative endocrine ROS  Renal/GU negative Renal ROS     Musculoskeletal  (+) Arthritis ,   Abdominal   Peds  Hematology negative hematology ROS (+)   Anesthesia Other Findings   Reproductive/Obstetrics                             Lab Results  Component Value Date   WBC 8.1 03/20/2022   HGB 13.8 03/20/2022   HCT 39.0 03/20/2022   MCV 100.8 (H) 03/20/2022   PLT 253 03/20/2022   Lab Results  Component Value Date   CREATININE 0.85 03/20/2022   BUN 11 03/20/2022   NA 142 03/20/2022   K 3.9 03/20/2022   CL 108 03/20/2022   CO2 27 03/20/2022    Anesthesia Physical  Anesthesia Plan  ASA: 3  Anesthesia Plan: General   Post-op Pain Management: Tylenol PO (pre-op)*   Induction: Intravenous  PONV Risk Score and Plan: 2 and Dexamethasone, Ondansetron and Treatment may vary due to age or medical condition  Airway Management Planned: Oral ETT  Additional Equipment: None  Intra-op Plan:   Post-operative Plan: Extubation in OR  Informed Consent: I have reviewed the patients History and Physical, chart, labs and discussed the procedure including the risks, benefits and alternatives for the proposed anesthesia  with the patient or authorized representative who has indicated his/her understanding and acceptance.     Dental advisory given  Plan Discussed with: Anesthesiologist  Anesthesia Plan Comments:        Anesthesia Quick Evaluation

## 2022-03-26 ENCOUNTER — Inpatient Hospital Stay (HOSPITAL_COMMUNITY)
Admission: RE | Admit: 2022-03-26 | Discharge: 2022-03-27 | DRG: 455 | Disposition: A | Discharge status: Home / Self Care | Payer: Medicare Other | Attending: Neurological Surgery | Admitting: Neurological Surgery

## 2022-03-26 ENCOUNTER — Encounter (HOSPITAL_COMMUNITY): Payer: Self-pay | Admitting: Neurological Surgery

## 2022-03-26 ENCOUNTER — Encounter (HOSPITAL_COMMUNITY)
Admission: RE | Disposition: A | Discharge status: Home / Self Care | Payer: Self-pay | Source: Home / Self Care | Attending: Neurological Surgery

## 2022-03-26 ENCOUNTER — Inpatient Hospital Stay (HOSPITAL_COMMUNITY): Payer: Medicare Other

## 2022-03-26 ENCOUNTER — Inpatient Hospital Stay (HOSPITAL_COMMUNITY): Payer: Medicare Other | Admitting: Anesthesiology

## 2022-03-26 ENCOUNTER — Other Ambulatory Visit: Payer: Self-pay

## 2022-03-26 DIAGNOSIS — F17210 Nicotine dependence, cigarettes, uncomplicated: Secondary | ICD-10-CM | POA: Diagnosis present

## 2022-03-26 DIAGNOSIS — Z79899 Other long term (current) drug therapy: Secondary | ICD-10-CM

## 2022-03-26 DIAGNOSIS — M48061 Spinal stenosis, lumbar region without neurogenic claudication: Secondary | ICD-10-CM | POA: Diagnosis not present

## 2022-03-26 DIAGNOSIS — M5116 Intervertebral disc disorders with radiculopathy, lumbar region: Secondary | ICD-10-CM | POA: Diagnosis not present

## 2022-03-26 DIAGNOSIS — M47816 Spondylosis without myelopathy or radiculopathy, lumbar region: Secondary | ICD-10-CM | POA: Diagnosis present

## 2022-03-26 DIAGNOSIS — Z981 Arthrodesis status: Secondary | ICD-10-CM | POA: Diagnosis not present

## 2022-03-26 DIAGNOSIS — Z888 Allergy status to other drugs, medicaments and biological substances status: Secondary | ICD-10-CM | POA: Diagnosis not present

## 2022-03-26 DIAGNOSIS — M109 Gout, unspecified: Secondary | ICD-10-CM | POA: Diagnosis present

## 2022-03-26 DIAGNOSIS — I10 Essential (primary) hypertension: Secondary | ICD-10-CM | POA: Diagnosis present

## 2022-03-26 DIAGNOSIS — J449 Chronic obstructive pulmonary disease, unspecified: Secondary | ICD-10-CM

## 2022-03-26 DIAGNOSIS — M47896 Other spondylosis, lumbar region: Secondary | ICD-10-CM | POA: Diagnosis not present

## 2022-03-26 DIAGNOSIS — M48062 Spinal stenosis, lumbar region with neurogenic claudication: Principal | ICD-10-CM | POA: Diagnosis present

## 2022-03-26 SURGERY — POSTERIOR LUMBAR FUSION 1 LEVEL
Anesthesia: General

## 2022-03-26 MED ORDER — THROMBIN 5000 UNITS EX SOLR
CUTANEOUS | Status: AC
  Filled 2022-03-26: qty 5000

## 2022-03-26 MED ORDER — ALLOPURINOL 300 MG PO TABS: 300.0000 mg | ORAL_TABLET | Freq: Every day | ORAL | Status: DC

## 2022-03-26 MED ORDER — LIDOCAINE-EPINEPHRINE 1 %-1:100000 IJ SOLN
INTRAMUSCULAR | Status: DC | PRN
  Administered 2022-03-26: 4 mL

## 2022-03-26 MED ORDER — CHLORHEXIDINE GLUCONATE CLOTH 2 % EX PADS: 6.0000 | MEDICATED_PAD | Freq: Once | CUTANEOUS | Status: DC

## 2022-03-26 MED ORDER — ACETAMINOPHEN 500 MG PO TABS: 1000.0000 mg | ORAL_TABLET | Freq: Once | ORAL | Status: DC

## 2022-03-26 MED ORDER — ONDANSETRON HCL 4 MG/2ML IJ SOLN
INTRAMUSCULAR | Status: DC | PRN
  Administered 2022-03-26: 4 mg via INTRAVENOUS

## 2022-03-26 MED ORDER — DOCUSATE SODIUM 100 MG PO CAPS
100.0000 mg | ORAL_CAPSULE | Freq: Two times a day (BID) | ORAL | Status: DC
  Administered 2022-03-26: 100 mg via ORAL
  Filled 2022-03-26: qty 1

## 2022-03-26 MED ORDER — BUPIVACAINE-EPINEPHRINE 0.5% -1:200000 IJ SOLN
INTRAMUSCULAR | Status: AC
  Filled 2022-03-26: qty 1

## 2022-03-26 MED ORDER — BUPIVACAINE LIPOSOME 1.3 % IJ SUSP
INTRAMUSCULAR | Status: AC
Start: 2022-03-26 — End: ?
  Filled 2022-03-26: qty 20

## 2022-03-26 MED ORDER — BUPIVACAINE LIPOSOME 1.3 % IJ SUSP
INTRAMUSCULAR | Status: DC | PRN
  Administered 2022-03-26: 20 mL

## 2022-03-26 MED ORDER — AMISULPRIDE (ANTIEMETIC) 5 MG/2ML IV SOLN: 10.0000 mg | Freq: Once | INTRAVENOUS | Status: DC | PRN

## 2022-03-26 MED ORDER — FENTANYL CITRATE (PF) 100 MCG/2ML IJ SOLN
25.0000 ug | INTRAMUSCULAR | Status: DC | PRN
  Administered 2022-03-26 (×3): 50 ug via INTRAVENOUS

## 2022-03-26 MED ORDER — 0.9 % SODIUM CHLORIDE (POUR BTL) OPTIME
TOPICAL | Status: DC | PRN
  Administered 2022-03-26: 1000 mL

## 2022-03-26 MED ORDER — BACITRACIN ZINC 500 UNIT/GM EX OINT
TOPICAL_OINTMENT | CUTANEOUS | Status: AC
  Filled 2022-03-26: qty 28.35

## 2022-03-26 MED ORDER — ACETAMINOPHEN 325 MG PO TABS
650.0000 mg | ORAL_TABLET | Freq: Once | ORAL | Status: AC
  Filled 2022-03-26: qty 2

## 2022-03-26 MED ORDER — LIDOCAINE 2% (20 MG/ML) 5 ML SYRINGE
INTRAMUSCULAR | Status: DC | PRN
  Administered 2022-03-26: 100 mg via INTRAVENOUS

## 2022-03-26 MED ORDER — FENTANYL CITRATE (PF) 250 MCG/5ML IJ SOLN
INTRAMUSCULAR | Status: AC
  Filled 2022-03-26: qty 5

## 2022-03-26 MED ORDER — MENTHOL 3 MG MT LOZG
1.0000 | LOZENGE | OROMUCOSAL | Status: DC | PRN
Start: 2022-03-26 — End: 2022-03-27

## 2022-03-26 MED ORDER — HYDROMORPHONE HCL 1 MG/ML IJ SOLN: 0.5000 mg | INTRAMUSCULAR | Status: DC | PRN

## 2022-03-26 MED ORDER — ONDANSETRON HCL 4 MG PO TABS: 4.0000 mg | ORAL_TABLET | Freq: Four times a day (QID) | ORAL | Status: DC | PRN

## 2022-03-26 MED ORDER — SODIUM CHLORIDE 0.9 % IV SOLN
250.0000 mL | INTRAVENOUS | Status: DC
Start: 2022-03-26 — End: 2022-03-27

## 2022-03-26 MED ORDER — THROMBIN 20000 UNITS EX SOLR
CUTANEOUS | Status: DC | PRN
  Administered 2022-03-26: 20 mL via TOPICAL

## 2022-03-26 MED ORDER — ROCURONIUM BROMIDE 10 MG/ML (PF) SYRINGE
PREFILLED_SYRINGE | INTRAVENOUS | Status: AC
  Filled 2022-03-26: qty 10

## 2022-03-26 MED ORDER — CEFAZOLIN SODIUM-DEXTROSE 2-4 GM/100ML-% IV SOLN
2.0000 g | Freq: Three times a day (TID) | INTRAVENOUS | Status: AC
  Administered 2022-03-26 (×2): 2 g via INTRAVENOUS
  Filled 2022-03-26 (×2): qty 100

## 2022-03-26 MED ORDER — BUPIVACAINE-EPINEPHRINE 0.5% -1:200000 IJ SOLN
INTRAMUSCULAR | Status: DC | PRN
  Administered 2022-03-26: 4 mL

## 2022-03-26 MED ORDER — SODIUM CHLORIDE 0.9% FLUSH: 3.0000 mL | INTRAVENOUS | Status: DC | PRN

## 2022-03-26 MED ORDER — ONDANSETRON HCL 4 MG/2ML IJ SOLN
INTRAMUSCULAR | Status: AC
Start: 2022-03-26 — End: ?
  Filled 2022-03-26: qty 2

## 2022-03-26 MED ORDER — ACETAMINOPHEN 500 MG PO TABS
ORAL_TABLET | ORAL | Status: AC
  Administered 2022-03-26: 650 mg via ORAL
  Filled 2022-03-26: qty 2

## 2022-03-26 MED ORDER — PROPOFOL 10 MG/ML IV BOLUS
INTRAVENOUS | Status: AC
  Filled 2022-03-26: qty 20

## 2022-03-26 MED ORDER — LIDOCAINE-EPINEPHRINE 1 %-1:100000 IJ SOLN
INTRAMUSCULAR | Status: AC
  Filled 2022-03-26: qty 1

## 2022-03-26 MED ORDER — DEXAMETHASONE SODIUM PHOSPHATE 10 MG/ML IJ SOLN
INTRAMUSCULAR | Status: DC | PRN
  Administered 2022-03-26: 5 mg via INTRAVENOUS

## 2022-03-26 MED ORDER — ACETAMINOPHEN 500 MG PO TABS
1000.0000 mg | ORAL_TABLET | Freq: Four times a day (QID) | ORAL | Status: DC
  Administered 2022-03-26: 1000 mg via ORAL
  Filled 2022-03-26: qty 2

## 2022-03-26 MED ORDER — CHLORHEXIDINE GLUCONATE 0.12 % MT SOLN: 15.0000 mL | Freq: Once | OROMUCOSAL | Status: AC

## 2022-03-26 MED ORDER — AMLODIPINE BESYLATE 5 MG PO TABS: 5.0000 mg | ORAL_TABLET | Freq: Every day | ORAL | Status: DC

## 2022-03-26 MED ORDER — ACETAMINOPHEN 650 MG RE SUPP: 650.0000 mg | RECTAL | Status: DC | PRN

## 2022-03-26 MED ORDER — THROMBIN 20000 UNITS EX SOLR
CUTANEOUS | Status: AC
  Filled 2022-03-26: qty 20000

## 2022-03-26 MED ORDER — CHLORHEXIDINE GLUCONATE 0.12 % MT SOLN
OROMUCOSAL | Status: AC
  Administered 2022-03-26: 15 mL via OROMUCOSAL
  Filled 2022-03-26: qty 15

## 2022-03-26 MED ORDER — FENTANYL CITRATE (PF) 250 MCG/5ML IJ SOLN
INTRAMUSCULAR | Status: DC | PRN
  Administered 2022-03-26: 100 ug via INTRAVENOUS
  Administered 2022-03-26 (×3): 50 ug via INTRAVENOUS

## 2022-03-26 MED ORDER — LACTATED RINGERS IV SOLN: INTRAVENOUS | Status: DC | PRN

## 2022-03-26 MED ORDER — PHENOL 1.4 % MT LIQD
1.0000 | OROMUCOSAL | Status: DC | PRN
Start: 2022-03-26 — End: 2022-03-27

## 2022-03-26 MED ORDER — FENTANYL CITRATE (PF) 100 MCG/2ML IJ SOLN
INTRAMUSCULAR | Status: AC
  Filled 2022-03-26: qty 2

## 2022-03-26 MED ORDER — SODIUM CHLORIDE 0.9% FLUSH
3.0000 mL | Freq: Two times a day (BID) | INTRAVENOUS | Status: DC
Start: 2022-03-26 — End: 2022-03-27
  Administered 2022-03-26: 3 mL via INTRAVENOUS

## 2022-03-26 MED ORDER — ACETAMINOPHEN 325 MG PO TABS
650.0000 mg | ORAL_TABLET | ORAL | Status: DC | PRN
  Administered 2022-03-26 – 2022-03-27 (×3): 650 mg via ORAL
  Filled 2022-03-26 (×3): qty 2

## 2022-03-26 MED ORDER — THROMBIN 5000 UNITS EX SOLR
OROMUCOSAL | Status: DC | PRN
  Administered 2022-03-26: 5 mL via TOPICAL

## 2022-03-26 MED ORDER — DEXAMETHASONE SODIUM PHOSPHATE 10 MG/ML IJ SOLN
INTRAMUSCULAR | Status: AC
  Filled 2022-03-26: qty 1

## 2022-03-26 MED ORDER — PHENYLEPHRINE 80 MCG/ML (10ML) SYRINGE FOR IV PUSH (FOR BLOOD PRESSURE SUPPORT)
PREFILLED_SYRINGE | INTRAVENOUS | Status: DC | PRN
  Administered 2022-03-26 (×2): 80 ug via INTRAVENOUS
  Administered 2022-03-26: 40 ug via INTRAVENOUS
  Administered 2022-03-26: 80 ug via INTRAVENOUS

## 2022-03-26 MED ORDER — ACETAMINOPHEN 325 MG PO TABS
ORAL_TABLET | ORAL | Status: AC
  Filled 2022-03-26: qty 2

## 2022-03-26 MED ORDER — TAMSULOSIN HCL 0.4 MG PO CAPS: 0.4000 mg | ORAL_CAPSULE | Freq: Every day | ORAL | Status: DC

## 2022-03-26 MED ORDER — METHOCARBAMOL 500 MG PO TABS
500.0000 mg | ORAL_TABLET | Freq: Four times a day (QID) | ORAL | Status: DC | PRN
  Administered 2022-03-26 – 2022-03-27 (×2): 500 mg via ORAL
  Filled 2022-03-26 (×2): qty 1

## 2022-03-26 MED ORDER — ROCURONIUM BROMIDE 10 MG/ML (PF) SYRINGE
PREFILLED_SYRINGE | INTRAVENOUS | Status: DC | PRN
  Administered 2022-03-26 (×2): 30 mg via INTRAVENOUS
  Administered 2022-03-26: 80 mg via INTRAVENOUS
  Administered 2022-03-26: 20 mg via INTRAVENOUS

## 2022-03-26 MED ORDER — PROMETHAZINE HCL 25 MG/ML IJ SOLN: 6.2500 mg | INTRAMUSCULAR | Status: DC | PRN

## 2022-03-26 MED ORDER — CEFAZOLIN SODIUM-DEXTROSE 2-4 GM/100ML-% IV SOLN
INTRAVENOUS | Status: AC
  Filled 2022-03-26: qty 100

## 2022-03-26 MED ORDER — LACTATED RINGERS IV SOLN: INTRAVENOUS | Status: DC

## 2022-03-26 MED ORDER — PROPOFOL 10 MG/ML IV BOLUS
INTRAVENOUS | Status: DC | PRN
  Administered 2022-03-26: 20 mg via INTRAVENOUS
  Administered 2022-03-26: 100 mg via INTRAVENOUS
  Administered 2022-03-26: 30 mg via INTRAVENOUS

## 2022-03-26 MED ORDER — PHENYLEPHRINE HCL-NACL 20-0.9 MG/250ML-% IV SOLN
INTRAVENOUS | Status: DC | PRN
  Administered 2022-03-26: 20 ug/min via INTRAVENOUS

## 2022-03-26 MED ORDER — ONDANSETRON HCL 4 MG/2ML IJ SOLN: 4.0000 mg | Freq: Four times a day (QID) | INTRAMUSCULAR | Status: DC | PRN

## 2022-03-26 MED ORDER — OXYCODONE HCL 5 MG PO TABS
10.0000 mg | ORAL_TABLET | ORAL | Status: DC | PRN
  Administered 2022-03-26 – 2022-03-27 (×4): 10 mg via ORAL
  Filled 2022-03-26 (×4): qty 2

## 2022-03-26 MED ORDER — LIDOCAINE 2% (20 MG/ML) 5 ML SYRINGE
INTRAMUSCULAR | Status: AC
Start: 2022-03-26 — End: ?
  Filled 2022-03-26: qty 5

## 2022-03-26 MED ORDER — ORAL CARE MOUTH RINSE: 15.0000 mL | Freq: Once | OROMUCOSAL | Status: AC

## 2022-03-26 MED ORDER — CEFAZOLIN SODIUM-DEXTROSE 2-4 GM/100ML-% IV SOLN
2.0000 g | INTRAVENOUS | Status: AC
  Administered 2022-03-26: 2 g via INTRAVENOUS

## 2022-03-26 MED ORDER — METHOCARBAMOL 1000 MG/10ML IJ SOLN
500.0000 mg | Freq: Four times a day (QID) | INTRAVENOUS | Status: DC | PRN
  Filled 2022-03-26: qty 5

## 2022-03-26 MED ORDER — OXYCODONE HCL 5 MG PO TABS: 5.0000 mg | ORAL_TABLET | ORAL | Status: DC | PRN

## 2022-03-26 MED ORDER — PHENYLEPHRINE 80 MCG/ML (10ML) SYRINGE FOR IV PUSH (FOR BLOOD PRESSURE SUPPORT)
PREFILLED_SYRINGE | INTRAVENOUS | Status: AC
  Filled 2022-03-26: qty 10

## 2022-03-26 MED ORDER — ALBUTEROL SULFATE (2.5 MG/3ML) 0.083% IN NEBU: 3.0000 mL | INHALATION_SOLUTION | Freq: Four times a day (QID) | RESPIRATORY_TRACT | Status: DC | PRN

## 2022-03-26 SURGICAL SUPPLY — 81 items
BAG BANDED W/RUBBER/TAPE 36X54 (MISCELLANEOUS) ×2 IMPLANT
BAG COUNTER SPONGE SURGICOUNT (BAG) ×3 IMPLANT
BAND RUBBER #18 3X1/16 STRL (MISCELLANEOUS) IMPLANT
BASKET BONE COLLECTION (BASKET) ×1 IMPLANT
BLADE BONE MILL MEDIUM (MISCELLANEOUS) ×1 IMPLANT
BUR CARBIDE MATCH 3.0 (BURR) ×2 IMPLANT
CAGE MOD-EX PL 7X9X28 17D (Cage) ×2 IMPLANT
CNTNR URN SCR LID CUP LEK RST (MISCELLANEOUS) ×1 IMPLANT
CONT SPEC 4OZ STRL OR WHT (MISCELLANEOUS) ×2
COVER BACK TABLE 60X90IN (DRAPES) ×2 IMPLANT
DERMABOND ADVANCED (GAUZE/BANDAGES/DRESSINGS) ×1
DERMABOND ADVANCED .7 DNX12 (GAUZE/BANDAGES/DRESSINGS) ×1 IMPLANT
DRAIN JACKSON RD 7FR 3/32 (WOUND CARE) IMPLANT
DRAPE C-ARM 42X72 X-RAY (DRAPES) ×3 IMPLANT
DRAPE LAPAROTOMY 100X72X124 (DRAPES) ×2 IMPLANT
DRAPE MICROSCOPE LEICA (MISCELLANEOUS) IMPLANT
DRAPE SURG 17X23 STRL (DRAPES) ×1 IMPLANT
DRSG OPSITE 4X5.5 SM (GAUZE/BANDAGES/DRESSINGS) ×2 IMPLANT
DRSG OPSITE POSTOP 4X8 (GAUZE/BANDAGES/DRESSINGS) ×1 IMPLANT
DRSG PAD ABDOMINAL 8X10 ST (GAUZE/BANDAGES/DRESSINGS) ×1 IMPLANT
DURAPREP 26ML APPLICATOR (WOUND CARE) ×2 IMPLANT
ELECT BLADE INSULATED 4IN (ELECTROSURGICAL) ×2
ELECT REM PT RETURN 9FT ADLT (ELECTROSURGICAL) ×2
ELECTRODE BLADE INSULATED 4IN (ELECTROSURGICAL) ×1 IMPLANT
ELECTRODE REM PT RTRN 9FT ADLT (ELECTROSURGICAL) ×1 IMPLANT
GAUZE 4X4 16PLY ~~LOC~~+RFID DBL (SPONGE) IMPLANT
GAUZE SPONGE 4X4 12PLY STRL (GAUZE/BANDAGES/DRESSINGS) ×1 IMPLANT
GEL DBM PROPEL 5ML (Putty) ×1 IMPLANT
GLOVE BIOGEL PI IND STRL 6.5 (GLOVE) IMPLANT
GLOVE BIOGEL PI IND STRL 7.0 (GLOVE) IMPLANT
GLOVE BIOGEL PI IND STRL 7.5 (GLOVE) IMPLANT
GLOVE BIOGEL PI IND STRL 8 (GLOVE) ×2 IMPLANT
GLOVE BIOGEL PI INDICATOR 6.5 (GLOVE) ×1
GLOVE BIOGEL PI INDICATOR 7.0 (GLOVE) ×1
GLOVE BIOGEL PI INDICATOR 7.5 (GLOVE) ×1
GLOVE BIOGEL PI INDICATOR 8 (GLOVE) ×2
GLOVE ECLIPSE 7.0 STRL STRAW (GLOVE) ×5 IMPLANT
GLOVE ECLIPSE 8.0 STRL XLNG CF (GLOVE) ×4 IMPLANT
GLOVE INDICATOR 7.5 STRL GRN (GLOVE) ×1 IMPLANT
GLOVE SURG ENC MOIS LTX SZ8 (GLOVE) ×2 IMPLANT
GLOVE SURG SS PI 6.5 STRL IVOR (GLOVE) ×1 IMPLANT
GLOVE SURG UNDER POLY LF SZ8.5 (GLOVE) ×2 IMPLANT
GOWN STRL REUS W/ TWL LRG LVL3 (GOWN DISPOSABLE) IMPLANT
GOWN STRL REUS W/ TWL XL LVL3 (GOWN DISPOSABLE) ×2 IMPLANT
GOWN STRL REUS W/TWL 2XL LVL3 (GOWN DISPOSABLE) IMPLANT
GOWN STRL REUS W/TWL LRG LVL3 (GOWN DISPOSABLE) ×4
GOWN STRL REUS W/TWL XL LVL3 (GOWN DISPOSABLE) ×8
HEMOSTAT POWDER KIT SURGIFOAM (HEMOSTASIS) ×1 IMPLANT
KIT BASIN OR (CUSTOM PROCEDURE TRAY) ×2 IMPLANT
KIT POSITION SURG JACKSON T1 (MISCELLANEOUS) ×2 IMPLANT
KIT TURNOVER KIT B (KITS) ×2 IMPLANT
MARKER SKIN DUAL TIP RULER LAB (MISCELLANEOUS) ×2 IMPLANT
MILL BONE PREP (MISCELLANEOUS) IMPLANT
NDL HYPO 21X1.5 ECLIPSE (NEEDLE) ×1 IMPLANT
NDL HYPO 25X1 1.5 SAFETY (NEEDLE) ×1 IMPLANT
NDL SPNL 22GX3.5 QUINCKE BK (NEEDLE) IMPLANT
NEEDLE HYPO 21X1.5 ECLIPSE (NEEDLE) ×2 IMPLANT
NEEDLE HYPO 25X1 1.5 SAFETY (NEEDLE) ×2 IMPLANT
NEEDLE SPNL 22GX3.5 QUINCKE BK (NEEDLE) ×2 IMPLANT
NS IRRIG 1000ML POUR BTL (IV SOLUTION) ×2 IMPLANT
PACK LAMINECTOMY NEURO (CUSTOM PROCEDURE TRAY) ×2 IMPLANT
PAD ARMBOARD 7.5X6 YLW CONV (MISCELLANEOUS) ×6 IMPLANT
PATTIES SURGICAL .5 X.5 (GAUZE/BANDAGES/DRESSINGS) IMPLANT
PATTIES SURGICAL .5 X1 (DISPOSABLE) IMPLANT
PATTIES SURGICAL 1X1 (DISPOSABLE) IMPLANT
ROD RELINE 5.5X90MM LORDOTIC (Rod) ×2 IMPLANT
SCREW LOCK RELINE 5.5 TULIP (Screw) ×8 IMPLANT
SCREW RELINE-O POLY 6.5X50MM (Screw) ×2 IMPLANT
SPIKE FLUID TRANSFER (MISCELLANEOUS) ×2 IMPLANT
SPONGE SURGIFOAM ABS GEL 100 (HEMOSTASIS) ×2 IMPLANT
SPONGE T-LAP 4X18 ~~LOC~~+RFID (SPONGE) IMPLANT
STAPLER VISISTAT 35W (STAPLE) ×2 IMPLANT
SUT VIC AB 0 CT1 18XCR BRD8 (SUTURE) ×1 IMPLANT
SUT VIC AB 0 CT1 8-18 (SUTURE) ×2
SUT VIC AB 2-0 CP2 18 (SUTURE) ×2 IMPLANT
SUT VIC AB 3-0 SH 8-18 (SUTURE) ×4 IMPLANT
SYR 20ML LL LF (SYRINGE) ×2 IMPLANT
TOWEL GREEN STERILE (TOWEL DISPOSABLE) IMPLANT
TOWEL GREEN STERILE FF (TOWEL DISPOSABLE) IMPLANT
TRAY FOLEY MTR SLVR 16FR STAT (SET/KITS/TRAYS/PACK) ×2 IMPLANT
WATER STERILE IRR 1000ML POUR (IV SOLUTION) ×2 IMPLANT

## 2022-03-26 NOTE — Transfer of Care (Signed)
Immediate Anesthesia Transfer of Care Note  Patient: Brent Turner  Procedure(s) Performed: OPEN DECOMPRESSION, POSTERIOR LUMBAR INTERBODY FUSION  LUMBAR THREE-FOUR ;EXPLORE FUSION LUMBAR FOUR-FIVE, LUMBAR FIVE-SACRAL ONE  Patient Location: PACU  Anesthesia Type:General  Level of Consciousness: drowsy  Airway & Oxygen Therapy: Patient Spontanous Breathing  Post-op Assessment: Report given to RN and Post -op Vital signs reviewed and stable  Post vital signs: Reviewed and stable  Last Vitals:  Vitals Value Taken Time  BP 135/71   Temp 36.8 C 03/26/22 1205  Pulse 97   Resp 12   SpO2 97     Last Pain:  Vitals:   03/26/22 0556  TempSrc: Oral  PainSc:          Complications: No notable events documented.

## 2022-03-26 NOTE — H&P (Signed)
Providing Compassionate, Quality Care - Together  NEUROSURGERY HISTORY & PHYSICAL   Brent Turner is an 81 y.o. male.   Chief Complaint: Bilateral lower extremity numbness and tingling  HPI: This is an 81 year old male with a history of an L4-S1 decompression, instrumentation and fusion years ago that complains of worsening low back pain, numbness tingling and weakness in his legs while standing and walking.  His neurogenic claudication symptoms are severe and causing alteration of his lifestyle.  Imaging revealed severe stenosis at L3-4 due to adjacent segment disease with broad-based disc bulging.  Therefore he presents today for surgical decompression, extension, interbody and posterior lateral fusion L3-4.  Past Medical History:  Diagnosis Date   ED (erectile dysfunction)    Gout    HTN (hypertension)    Vitamin D deficiency     Past Surgical History:  Procedure Laterality Date   COLONOSCOPY     EYE SURGERY Bilateral    cataracts removed   LUMBAR DISC SURGERY Left     Family History  Problem Relation Age of Onset   Stroke Mother 68   Cancer Father 27       lung   Social History:  reports that he has been smoking cigarettes. He has a 50.00 pack-year smoking history. He has never used smokeless tobacco. He reports current alcohol use of about 7.0 standard drinks of alcohol per week. He reports that he does not use drugs.  Allergies:  Allergies  Allergen Reactions   Tetanus Toxoid, Adsorbed     States he had "bad reaction and declines another one"    Tetanus Toxoids     States he had "bad reaction and declines another one"     Medications Prior to Admission  Medication Sig Dispense Refill   albuterol (VENTOLIN HFA) 108 (90 Base) MCG/ACT inhaler Inhale 2 puffs into the lungs every 6 (six) hours as needed for wheezing or shortness of breath. 18 g 3   allopurinol (ZYLOPRIM) 300 MG tablet TAKE 1 TABLET BY MOUTH EVERY DAY 90 tablet 2   amLODipine (NORVASC) 5 MG tablet  Take 1 tablet (5 mg total) by mouth daily. 90 tablet 3   Cholecalciferol 1000 UNITS tablet Take 1,000 Units by mouth daily.     HYDROcodone-acetaminophen (NORCO) 10-325 MG tablet Take 1 tablet by mouth every 6 (six) hours as needed for moderate pain. 100 tablet 0   sildenafil (VIAGRA) 100 MG tablet Take 0.5-1 tablets (50-100 mg total) by mouth daily as needed for erectile dysfunction. 12 tablet 11   tamsulosin (FLOMAX) 0.4 MG CAPS capsule TAKE 1 CAPSULE BY MOUTH DAILY *NEED APPOINTMENT WITH LABS FOR REFILLS* 90 capsule 1    No results found for this or any previous visit (from the past 48 hour(s)). No results found.  ROS All pertinent positives and negatives are listed HPI above  Blood pressure (!) 158/68, pulse 68, temperature 97.6 F (36.4 C), temperature source Oral, resp. rate 18, height 6' (1.829 m), weight 61.2 kg, SpO2 96 %. 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  Exploration of fusion at L4-5, L5-S1.  We discussed all risks, benefits and expected outcomes.  Informed consent was obtained.  Answered all of his questions.  Thank you for allowing me to participate in this patient's care.  Please do not hesitate to call with questions or concerns.   Elwin Sleight, [facility] Cell: [phone]

## 2022-03-26 NOTE — Anesthesia Procedure Notes (Signed)
Procedure Name: Intubation Date/Time: 03/26/2022 7:47 AM  Performed by: Erick Colace, CRNAPre-anesthesia Checklist: Patient identified, Emergency Drugs available, Suction available and Patient being monitored Patient Re-evaluated:Patient Re-evaluated prior to induction Oxygen Delivery Method: Circle system utilized Preoxygenation: Pre-oxygenation with 100% oxygen Induction Type: IV induction Ventilation: Mask ventilation without difficulty Laryngoscope Size: Mac and 4 Grade View: Grade I Tube type: Oral Tube size: 7.5 mm Number of attempts: 1 Airway Equipment and Method: Stylet and Oral airway Placement Confirmation: ETT inserted through vocal cords under direct vision, positive ETCO2 and breath sounds checked- equal and bilateral Secured at: 23 cm Tube secured with: Tape Dental Injury: Teeth and Oropharynx as per pre-operative assessment

## 2022-03-26 NOTE — Op Note (Signed)
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 Kendell Bane C. Danilo Cappiello, DO  ASSISTANT: Dr. Lisbeth Renshaw, MD  ANESTHESIA: General Endotracheal  EBL: 150 cc  SPECIMENS: None  DRAINS: None  COMPLICATIONS: None  CONDITION: Hemodynamically stable  HISTORY: Brent Turner is a 81 y.o. male with a history of an L4-S1 posterior instrumentation and fusion years ago that noted bilateral lower extremity weakness, numbness tingling and difficulty standing and walking for more than a few moments.  MRI revealed severe stenosis at L3-4 due to ligamentum hypertrophy and adjacent segment disease.  We discussed conservative measures which he failed pain control, we discussed surgical intervention in the form of instrumentation and fusion at L3-4 with lumbar interbody fusion and decompression, with exploration of fusion L4-S1.  All risks benefits and expected outcomes were discussed and agreed upon.  Informed consent was obtained.  PROCEDURE IN DETAIL: The patient was brought to the operating room. After induction of general anesthesia, the patient was positioned on the operative table in the prone position. All pressure points were meticulously padded. Skin incision was then marked out and prepped and draped in the usual  sterile fashion. Physician driven timeout was performed.  Using a 10 blade, midline lumbar incision was made over the L3 spinous process extending down and opening the prior incision.  Using Bovie electrocautery, dissection was performed down to the lumbodorsal fascia.  Subperiosteal dissection was then performed bilaterally to the expose the L3 lamina bilaterally, L3-4 facets, L3 transverse processes bilaterally.  This was then carried inferiorly and laterally to expose the prior hardware at L4, L5, S1 bilaterally.  Deep retractors were placed in the wound.  Lateral fluoroscopy confirmed the appropriate level.  I then remove the prior set screws in the L4, L5 and S1 screws, rods were removed bilaterally.  Using Leksell, I pulled on the L 4, L5 and S1 screws and there appeared to be appropriate arthrodesis bilaterally. I then remove the spinous process of L3 with a Leksell rongeur.  Using a high-speed drill and saving the autograft, I performed a bilateral laminectomy and bilateral resection of the inferior articulating process down to the ligamentum flavum.  This bone was saved for autograft.  I then carried the laminectomy to the attachment point of the ligamentum flavum bilaterally along the pars.  I then carefully dissected the ligamentum flavum from the epidural space and resected it completely bilaterally using Kerrison rongeurs.  The thecal sac was carefully protected.  The traversing nerve roots were gently dissected and the epidural veins were coagulated and cut with bipolar and microscissors.  Gently retracting medially, I made annulotomy and performed a radical discectomy using Kerrison rongeurs, disc prep shavers and curettes.  This was then performed on the contralateral side again with care to gently retract the traversing nerve root.  Once radical discectomy was performed, appropriate sized interbody device was chosen as above.  A mixture of autograft was then placed anteriorly and the disc space.   The  interbody device was placed under live lateral fluoroscopy, the left interbody initially.  This was then backfilled with DBM.  I then gently retracted the thecal sac again on the contralateral side and placed the second titanium interbody.  These were expanded to a height of 11 mm.  Lateral fluoroscopy confirmed appropriate placement.  Epidural hemostasis was achieved with Surgifoam.  I then decorticated the lateral gutters.  I then created a pilot hole along the TP superior to collating process junction at the mamillary body bilaterally at L3, using a pedicle finder, I accessed the pedicle bilaterally.  AP and lateral fluoroscopy confirmed appropriate placement.  Using ball-tipped feeler, there were bony borders in all directions and at the floor.  Using a 5.5 mm tap, I tapped the trajectories, this was then felt with a ball-tipped probe there were bony borders in all directions and in the floor. The above listed pedicle screw was selected and placed with appropriate bony purchase bilaterally at L3.  AP and lateral fluoroscopy confirmed appropriate placement.  I then packed the lateral gutters with the remaining autograft.  Appropriate size rods were then measured, contoured and placed.  Setscrews were placed L3, L4, L5, S1 bilaterally.  The setscrews were final tightened to the manufacturer's recommendation.  Deep retractors were taken out of the wound.  Hemostasis was achieved with bipolar cautery.  Long-acting anesthetic was placed in the muscle and skin.  The wound was noted to be excellently hemostatic.  The wound was then closed in layers, 0 Vicryl for muscle and fascia.  Dermis was closed with 2-0 and 3-0 Vicryl sutures.  The skin was closed with skin glue and sterile dressing was applied.  At the end of the case all sponge, needle, and instrument counts were correct. The patient was then transferred to the stretcher, extubated, and taken to the post-anesthesia care unit in stable hemodynamic  condition.

## 2022-03-26 NOTE — Anesthesia Postprocedure Evaluation (Signed)
Anesthesia Post Note  Patient: Brent Turner  Procedure(s) Performed: OPEN DECOMPRESSION, POSTERIOR LUMBAR INTERBODY FUSION  LUMBAR THREE-FOUR ;EXPLORE FUSION LUMBAR FOUR-FIVE, LUMBAR FIVE-SACRAL ONE     Patient location during evaluation: PACU Anesthesia Type: General Level of consciousness: sedated Pain management: pain level controlled Vital Signs Assessment: post-procedure vital signs reviewed and stable Respiratory status: spontaneous breathing and respiratory function stable Cardiovascular status: stable Postop Assessment: no apparent nausea or vomiting Anesthetic complications: no   No notable events documented.  Last Vitals:  Vitals:   03/26/22 1250 03/26/22 1305  BP: (!) 145/80 126/85  Pulse: 84 82  Resp: 17 15  Temp:  36.8 C  SpO2: 91% 94%    Last Pain:  Vitals:   03/26/22 1305  TempSrc:   PainSc: 4     LLE Motor Response: Purposeful movement (03/26/22 1305) LLE Sensation: Full sensation (03/26/22 1305) RLE Motor Response: Purposeful movement (03/26/22 1305) RLE Sensation: Full sensation (03/26/22 1305)      Marisa Hufstetler DANIEL

## 2022-03-27 MED ORDER — METHOCARBAMOL 500 MG PO TABS: 750.0000 mg | ORAL_TABLET | Freq: Four times a day (QID) | ORAL | 3 refills | Status: DC | PRN

## 2022-03-27 MED ORDER — OXYCODONE-ACETAMINOPHEN 5-325 MG PO TABS: 1.0000 | ORAL_TABLET | ORAL | 0 refills | Status: DC | PRN

## 2022-03-27 NOTE — Discharge Summary (Signed)
Physician Discharge Summary  Patient ID: Brent Turner MRN: [id number] DOB/AGE: 02-10-1941 81 y.o.  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  Exploration of fusion at L4-5, L5-S1. The patient tolerated the procedure well and was taken to the recovery room and then to the floor in stable condition. The hospital course was routine. There were no complications. The wound remained clean dry and intact. Pt had appropriate back soreness. No complaints of leg pain or new N/T/W. The patient remained afebrile with stable vital signs, and tolerated a regular diet. The patient continued to increase activities, and pain was well controlled with oral pain medications.   Consults: None  Significant Diagnostic Studies: radiology: X-Ray: intraoperative   Treatments: surgery:  Open L3-4 posterior lumbar interbody arthrodesis L3-4 posteriolateral arthrodesis Open L3-4 lumbar laminectomy, decompression Bilateral L3 pedicle screw instrumentation; NuVasive 6.5 x 50 mm pedicle screws Exploration of fusion L4-5, L5-S1 Placement of anterior biomechanical device, L3-4, NuVasive modex expandable titanium interbodies (13 degree lordosis, anterior height 11 mm) Intraoperative use of allograft Intraoperative use of autograft, same incision Intraoperative use of fluoroscopy  Discharge Exam: Blood pressure 132/64, pulse 66, temperature 98.5 F (36.9 C), temperature source Oral, resp. rate 16, height 6' (1.829 m), weight 61.2 kg, SpO2  100 %. Physical Exam: Patient is awake, A/O X 4, conversant, and in good spirits. Eyes open spontaneously. They are in NAD and VSS. Doing well. Speech is fluent and appropriate. MAEW with good strength that is symmetric bilaterally.  BUE 5/5 throughout, BLE 5/5 throughout. Sensation to light touch is intact. PERLA, EOMI. CNs grossly intact. Dressing is clean dry intact. Incision is well approximated with no drainage, erythema, or edema.      Disposition: Discharge disposition: 01-Home or Self Care       Discharge Instructions     Incentive spirometry RT   Complete by: As directed       Allergies as of 03/27/2022       Reactions   Tetanus Toxoid, Adsorbed    States he had "bad reaction and declines another one"    Tetanus Toxoids    States he had "bad reaction and declines another one"         Medication List     STOP taking these medications    HYDROcodone-acetaminophen 10-325 MG tablet Commonly known as: NORCO       TAKE these medications    albuterol 108 (90 Base) MCG/ACT inhaler Commonly known as: VENTOLIN HFA Inhale 2 puffs into the lungs every 6 (six) hours as needed for wheezing or shortness of breath.   allopurinol 300 MG tablet Commonly known as: ZYLOPRIM TAKE 1 TABLET BY MOUTH EVERY DAY   amLODipine 5 MG tablet Commonly known as: NORVASC Take 1 tablet (5 mg total) by mouth daily.   Cholecalciferol 25 MCG (1000 UT) tablet Take 1,000 Units by mouth daily.   methocarbamol 500 MG tablet Commonly known as: ROBAXIN Take 1.5 tablets (750 mg total) by mouth every 6 (six) hours as needed for muscle spasms.   oxyCODONE-acetaminophen 5-325 MG tablet Commonly known as: Percocet Take 1-2 tablets by mouth every 4 (four) hours  as needed for severe pain.   sildenafil 100 MG tablet Commonly known as: Viagra Take 0.5-1 tablets (50-100 mg total) by mouth daily as needed for erectile dysfunction.   tamsulosin 0.4 MG Caps capsule Commonly known as:  FLOMAX TAKE 1 CAPSULE BY MOUTH DAILY *NEED APPOINTMENT WITH LABS FOR REFILLS*         Signed: Council Mechanic, DNP, AGNP-C Neurosurgery Nurse Practitioner  [facility] [address], [address], [place], [place] [address] P: [phone]    F: [phone]  03/27/2022 8:22 AM

## 2022-03-27 NOTE — Evaluation (Signed)
Physical Therapy Evaluation  Patient Details Name: Brent Turner MRN: [id number] DOB: 1940/10/22 Today's Date: 03/27/2022  History of Present Illness  Pt is a 81 y/o male presenting on 7/25 for elective L3-4 PLIF, exploration on L4-5, L5-S1. PMH includes: Gout, HTN, piro back surgery.  Clinical Impression  Pt admitted with above diagnosis. At the time of PT eval, pt was able to demonstrate transfers and ambulation with gross supervision for safety and rollator for support as he uses the rollator at home. Pt was educated on precautions, positioning recommendations, appropriate activity progression, and car transfer. Pt currently with functional limitations due to the deficits listed below (see PT Problem List). Pt will benefit from skilled PT to increase their independence and safety with mobility to allow discharge to the venue listed below.         Recommendations for follow up therapy are one component of a multi-disciplinary discharge planning process, led by the attending physician.  Recommendations may be updated based on patient status, additional functional criteria and insurance authorization.  Follow Up Recommendations No PT follow up      Assistance Recommended at Discharge PRN  Patient can return home with the following  A little help with walking and/or transfers;Assistance with cooking/housework;Assist for transportation;Help with stairs or ramp for entrance    Equipment Recommendations None recommended by PT  Recommendations for Other Services       Functional Status Assessment Patient has had a recent decline in their functional status and demonstrates the ability to make significant improvements in function in a reasonable and predictable amount of time.     Precautions / Restrictions Precautions Precautions: Back Precaution Booklet Issued: Yes (comment) Precaution Comments: reviewed with pt Required Braces or Orthoses:  (no brace needed) Restrictions Weight Bearing  Restrictions: No      Mobility  Bed Mobility Overal bed mobility: Needs Assistance Bed Mobility: Rolling, Sidelying to Sit Rolling: Supervision Sidelying to sit: Supervision       General bed mobility comments: Light cues for optimal log roll technique.    Transfers Overall transfer level: Needs assistance Equipment used: Rollator (4 wheels) Transfers: Sit to/from Stand Sit to Stand: Supervision           General transfer comment: VC's for hand placement on seated surface for safety. Rollator use to simulate home environment.    Ambulation/Gait Ambulation/Gait assistance: Supervision Gait Distance (Feet): 300 Feet Assistive device: Rollator (4 wheels) Gait Pattern/deviations: Step-through pattern, Decreased stride length Gait velocity: Decreased Gait velocity interpretation: 1.31 - 2.62 ft/sec, indicative of limited community ambulator   General Gait Details: Good posture throughout OOB mobility. VC's for closer rollator proximity. Pt demonstrated turns without LOB.  Stairs Stairs: Yes Stairs assistance: Supervision Stair Management: One rail Left, Step to pattern, Forwards Number of Stairs: 10 General stair comments: VC's for sequencing and general safety.  Wheelchair Mobility    Modified Rankin (Stroke Patients Only)       Balance Overall balance assessment: Mild deficits observed, not formally tested                                           Pertinent Vitals/Pain Pain Assessment Pain Assessment: Faces Faces Pain Scale: Hurts little more Pain Location: back, incisional Pain Descriptors / Indicators: Discomfort, Guarding, Operative site guarding Pain Intervention(s): Limited activity within patient's tolerance, Monitored during session, Repositioned    Home Living Family/patient  expects to be discharged to:: Private residence Living Arrangements: Spouse/significant other Available Help at Discharge: Family;Available 24 hours/day  ("90% of the time") Type of Home: Other(Comment) (town house) Home Access: Level entry     Alternate Level Stairs-Number of Steps: 6 steps, landing 8 steps Home Layout: Two level;1/2 bath on main level;Bed/bath upstairs Home Equipment: Rollator (4 wheels);Shower seat;Tub bench;Wheelchair - manual      Prior Function Prior Level of Function : Independent/Modified Independent               ADLs Comments: driving, working     Higher education careers adviser        Extremity/Trunk Assessment   Upper Extremity Assessment Upper Extremity Assessment: Defer to OT evaluation    Lower Extremity Assessment Lower Extremity Assessment: Generalized weakness (Consistent with pre-op diagnosis)    Cervical / Trunk Assessment Cervical / Trunk Assessment: Back Surgery  Communication   Communication: No difficulties  Cognition Arousal/Alertness: Awake/alert Behavior During Therapy: WFL for tasks assessed/performed Overall Cognitive Status: Within Functional Limits for tasks assessed                                          General Comments      Exercises     Assessment/Plan    PT Assessment Patient needs continued PT services  PT Problem List Decreased strength;Decreased activity tolerance;Decreased balance;Decreased mobility;Decreased knowledge of use of DME;Decreased safety awareness;Decreased knowledge of precautions;Pain       PT Treatment Interventions DME instruction;Gait training;Stair training;Functional mobility training;Therapeutic activities;Therapeutic exercise;Balance training;Patient/family education    PT Goals (Current goals can be found in the Care Plan section)  Acute Rehab PT Goals Patient Stated Goal: None stated; eager to d/c home today PT Goal Formulation: With patient/family Time For Goal Achievement: 04/03/22 Potential to Achieve Goals: Good    Frequency Min 5X/week     Co-evaluation               AM-PAC PT "6 Clicks" Mobility   Outcome Measure Help needed turning from your back to your side while in a flat bed without using bedrails?: A Little Help needed moving from lying on your back to sitting on the side of a flat bed without using bedrails?: A Little Help needed moving to and from a bed to a chair (including a wheelchair)?: A Little Help needed standing up from a chair using your arms (e.g., wheelchair or bedside chair)?: A Little Help needed to walk in hospital room?: A Little Help needed climbing 3-5 steps with a railing? : A Little 6 Click Score: 18    End of Session Equipment Utilized During Treatment: Gait belt Activity Tolerance: Patient tolerated treatment well Patient left: in bed;with call bell/phone within reach;with family/visitor present Nurse Communication: Mobility status PT Visit Diagnosis: Unsteadiness on feet (R26.81);Pain Pain - part of body:  (back)    Time: 1829-9371 PT Time Calculation (min) (ACUTE ONLY): 11 min   Charges:   PT Evaluation $PT Eval Low Complexity: 1 Low          Conni Slipper, PT, DPT Acute Rehabilitation Services Secure Chat Preferred Office: [phone]   Marylynn Pearson 03/27/2022, 10:24 AM

## 2022-03-27 NOTE — Evaluation (Signed)
Occupational Therapy Evaluation Patient Details Name: Brent Turner MRN: [id number] DOB: [birth year] Today's Date: 03/27/2022   History of Present Illness Pt is a 81 y/o male presenting on 7/25 for elective L3-4 PLIF, exploration on L4-5, L5-S1. PMH includes: Gout, HTN, piro back surgery.   Clinical Impression   PTA patient independent and working part time, driving.  Admitted for above and demonstrates ability to complete ADLs, mobility and transfers with supervision using RW.  He was educated on back precautions, Adl compensatory techniques, recommendations, safety, DME and activity progression.  Min cueing for adherence to precautions (specifically twisting) during session.  He has good support from friend, who can assist as needed.  Based on performance today, no further OT needs identified and OT will sign off.       Recommendations for follow up therapy are one component of a multi-disciplinary discharge planning process, led by the attending physician.  Recommendations may be updated based on patient status, additional functional criteria and insurance authorization.   Follow Up Recommendations  No OT follow up    Assistance Recommended at Discharge PRN  Patient can return home with the following Assist for transportation;Assistance with cooking/housework    Functional Status Assessment  Patient has had a recent decline in their functional status and demonstrates the ability to make significant improvements in function in a reasonable and predictable amount of time.  Equipment Recommendations  BSC/3in1    Recommendations for Other Services PT consult     Precautions / Restrictions Precautions Precautions: Back Precaution Booklet Issued: Yes (comment) Precaution Comments: reviewed with pt Required Braces or Orthoses:  (no brace needed) Restrictions Weight Bearing Restrictions: No      Mobility Bed Mobility Overal bed mobility: Needs Assistance             General  bed mobility comments: sitting EOB upon entry, able to verbalize log roll technique    Transfers Overall transfer level: Needs assistance Equipment used: Rolling walker (2 wheels) Transfers: Sit to/from Stand Sit to Stand: Supervision           General transfer comment: supervision for posture and hand placement using RW      Balance Overall balance assessment: Mild deficits observed, not formally tested                                         ADL either performed or assessed with clinical judgement   ADL Overall ADL's : Needs assistance/impaired                                     Functional mobility during ADLs: Supervision/safety General ADL Comments: Pt demonstrates abilityto complete with supervision for ADLs and transfers after education on compensatory techniques, safety and back precautions. min cueing for precaution adherance (tends to twist)     Vision         Perception     Praxis      Pertinent Vitals/Pain Pain Assessment Pain Assessment: Faces Faces Pain Scale: Hurts little more Pain Location: back, incisional Pain Descriptors / Indicators: Discomfort, Guarding, Operative site guarding Pain Intervention(s): Limited activity within patient's tolerance, Monitored during session, Repositioned     Hand Dominance     Extremity/Trunk Assessment Upper Extremity Assessment Upper Extremity Assessment: Overall WFL for tasks assessed   Lower Extremity Assessment Lower  Extremity Assessment: Defer to PT evaluation   Cervical / Trunk Assessment Cervical / Trunk Assessment: Back Surgery   Communication Communication Communication: No difficulties   Cognition Arousal/Alertness: Awake/alert Behavior During Therapy: WFL for tasks assessed/performed Overall Cognitive Status: Within Functional Limits for tasks assessed                                       General Comments       Exercises     Shoulder  Instructions      Home Living Family/patient expects to be discharged to:: Private residence Living Arrangements: Spouse/significant other Available Help at Discharge: Family;Available 24 hours/day ("90% of the time") Type of Home: Other(Comment) (town house) Home Access: Level entry     Home Layout: Two level;1/2 bath on main level;Bed/bath upstairs Alternate Level Stairs-Number of Steps: 6 steps, landing 8 steps Alternate Level Stairs-Rails: Right Bathroom Shower/Tub: Chief Strategy Officer: Standard     Home Equipment: Rollator (4 wheels);Shower seat;Tub bench;Wheelchair - manual          Prior Functioning/Environment Prior Level of Function : Independent/Modified Independent               ADLs Comments: driving, working        OT Problem List: Decreased activity tolerance;Decreased knowledge of use of DME or AE;Decreased knowledge of precautions;Pain;Impaired balance (sitting and/or standing)      OT Treatment/Interventions:      OT Goals(Current goals can be found in the care plan section) Acute Rehab OT Goals Patient Stated Goal: home OT Goal Formulation: With patient  OT Frequency:      Co-evaluation              AM-PAC OT "6 Clicks" Daily Activity     Outcome Measure Help from another person eating meals?: None Help from another person taking care of personal grooming?: A Little Help from another person toileting, which includes using toliet, bedpan, or urinal?: A Little Help from another person bathing (including washing, rinsing, drying)?: A Little Help from another person to put on and taking off regular upper body clothing?: A Little Help from another person to put on and taking off regular lower body clothing?: A Little 6 Click Score: 19   End of Session Equipment Utilized During Treatment: Rolling walker (2 wheels) Nurse Communication: Mobility status  Activity Tolerance: Patient tolerated treatment well Patient left: with  call bell/phone within reach;Other (comment) (sitting EOB)  OT Visit Diagnosis: Other abnormalities of gait and mobility (R26.89);Pain Pain - part of body:  (back)                Time: 7106-2694 OT Time Calculation (min): 23 min Charges:  OT General Charges $OT Visit: 1 Visit OT Evaluation $OT Eval Low Complexity: 1 Low  Barry Brunner, OT Acute Rehabilitation Services Office [phone]   Chancy Milroy 03/27/2022, 9:49 AM

## 2022-03-27 NOTE — Plan of Care (Signed)
Pt doing well. Pt and family given D/C instructions with verbal understanding. Rx's were sent to the pharmacy by MD. Pt's incision is clean and dry with no sign of infection. Pt's IV was removed prior to D/C. Pt's incision is clean and dry with no sign of infection. Pt received 3-n-1 from [employer] per MD order. Pt D/C'd home via wheelchair per MD order, Pt is stable at D/C. Rema Fendt, RN

## 2022-03-28 ENCOUNTER — Other Ambulatory Visit: Payer: Self-pay | Admitting: *Deleted

## 2022-03-28 NOTE — Patient Outreach (Signed)
  Care Coordination [facility] Note Transition Care Management Follow-up Telephone Call Date of discharge and from where: [phone] How have you been since you were released from the hospital? A little sore Any questions or concerns? No  Items Reviewed: Did the pt receive and understand the discharge instructions provided? Yes  Medications obtained and verified? Yes  Other?  Any new allergies since your discharge? No  Dietary orders reviewed? No Do you have support at home? Yes   Home Care and Equipment/Supplies: Were home health services ordered? no If so, what is the name of the agency? N/a  Has the agency set up a time to come to the patient's home? not applicable Were any new equipment or medical supplies ordered?  No What is the name of the medical supply agency? N/a Were you able to get the supplies/equipment? not applicable Do you have any questions related to the use of the equipment or supplies? No  Functional Questionnaire: (I = Independent and D = Dependent) ADLs: I  Bathing/Dressing- I  Meal Prep- I  Eating- I  Maintaining continence- I  Transferring/Ambulation- I  Managing Meds- I  Follow up appointments reviewed:  PCP Hospital f/u appt confirmed? No   Specialist Hospital f/u appt confirmed? Yes  Scheduled to see Dr Channing Mutters April 10, 2022 at 10 am Are transportation arrangements needed? No  If their condition worsens, is the pt aware to call PCP or go to the Emergency Dept.? Yes Was the patient provided with contact information for the PCP's office or ED? y Was to pt encouraged to call back with questions or concerns? Y   SDOH assessments and interventions completed:   Yes  Care Coordination Interventions Activated:  No Care Coordination Interventions:   N/a  Encounter Outcome:  Pt. Visit Completed

## 2022-04-10 DIAGNOSIS — M48062 Spinal stenosis, lumbar region with neurogenic claudication: Secondary | ICD-10-CM | POA: Diagnosis not present

## 2022-04-10 DIAGNOSIS — Z681 Body mass index (BMI) 19 or less, adult: Secondary | ICD-10-CM | POA: Diagnosis not present

## 2022-05-20 DIAGNOSIS — M48062 Spinal stenosis, lumbar region with neurogenic claudication: Secondary | ICD-10-CM | POA: Diagnosis not present

## 2022-05-27 NOTE — Patient Instructions (Signed)
Steps to Quit Smoking Smoking tobacco is the leading cause of preventable death. It can affect almost every organ in the body. Smoking puts you and people around you at risk for many serious, long-lasting (chronic) diseases. Quitting smoking can be hard, but it is one of the best things that you can do for your health. It is never too late to quit. Do not give up if you cannot quit the first time. Some people need to try many times to quit. Do your best to stick to your quit plan, and talk with your doctor if you have any questions or concerns. How do I get ready to quit? Pick a date to quit. Set a date within the next 2 weeks to give you time to prepare. Write down the reasons why you are quitting. Keep this list in places where you will see it often. Tell your family, friends, and co-workers that you are quitting. Their support is important. Talk with your doctor about the choices that may help you quit. Find out if your health insurance will pay for these treatments. Know the people, places, things, and activities that make you want to smoke (triggers). Avoid them. What first steps can I take to quit smoking? Throw away all cigarettes at home, at work, and in your car. Throw away the things that you use when you smoke, such as ashtrays and lighters. Clean your car. Empty the ashtray. Clean your home, including curtains and carpets. What can I do to help me quit smoking? Talk with your doctor about taking medicines and seeing a counselor. You are more likely to succeed when you do both. If you are pregnant or breastfeeding: Talk with your doctor about counseling or other ways to quit smoking. Do not take medicine to help you quit smoking unless your doctor tells you to. Quit right away Quit smoking completely, instead of slowly cutting back on how much you smoke over a period of time. Stopping smoking right away may be more successful than slowly quitting. Go to counseling. In-person is best  if this is an option. You are more likely to quit if you go to counseling sessions regularly. Take medicine You may take medicines to help you quit. Some medicines need a prescription, and some you can buy over-the-counter. Some medicines may contain a drug called nicotine to replace the nicotine in cigarettes. Medicines may: Help you stop having the desire to smoke (cravings). Help to stop the problems that come when you stop smoking (withdrawal symptoms). Your doctor may ask you to use: Nicotine patches, gum, or lozenges. Nicotine inhalers or sprays. Non-nicotine medicine that you take by mouth. Find resources Find resources and other ways to help you quit smoking and remain smoke-free after you quit. They include: Online chats with a counselor. Phone quitlines. Printed self-help materials. Support groups or group counseling. Text messaging programs. Mobile phone apps. Use apps on your mobile phone or tablet that can help you stick to your quit plan. Examples of free services include Quit Guide from the CDC and smokefree.gov  What can I do to make it easier to quit?  Talk to your family and friends. Ask them to support and encourage you. Call a phone quitline, such as 1-800-QUIT-NOW, reach out to support groups, or work with a counselor. Ask people who smoke to not smoke around you. Avoid places that make you want to smoke, such as: Bars. Parties. Smoke-break areas at work. Spend time with people who do not smoke. Lower   the stress in your life. Stress can make you want to smoke. Try these things to lower stress: Getting regular exercise. Doing deep-breathing exercises. Doing yoga. Meditating. What benefits will I see if I quit smoking? Over time, you may have: A better sense of smell and taste. Less coughing and sore throat. A slower heart rate. Lower blood pressure. Clearer skin. Better breathing. Fewer sick days. Summary Quitting smoking can be hard, but it is one of  the best things that you can do for your health. Do not give up if you cannot quit the first time. Some people need to try many times to quit. When you decide to quit smoking, make a plan to help you succeed. Quit smoking right away, not slowly over a period of time. When you start quitting, get help and support to keep you smoke-free. This information is not intended to replace advice given to you by your health care provider. Make sure you discuss any questions you have with your health care provider. Document Revised: 08/10/2021 Document Reviewed: 08/10/2021 Elsevier Patient Education  [place] Maintenance, Male Adopting a healthy lifestyle and getting preventive care are important in promoting health and wellness. Ask your health care provider about: The right schedule for you to have regular tests and exams. Things you can do on your own to prevent diseases and keep yourself healthy. What should I know about diet, weight, and exercise? Eat a healthy diet  Eat a diet that includes plenty of vegetables, fruits, low-fat dairy products, and lean protein. Do not eat a lot of foods that are high in solid fats, added sugars, or sodium. Maintain a healthy weight Body mass index (BMI) is a measurement that can be used to identify possible weight problems. It estimates body fat based on height and weight. Your health care provider can help determine your BMI and help you achieve or maintain a healthy weight. Get regular exercise Get regular exercise. This is one of the most important things you can do for your health. Most adults should: Exercise for at least 150 minutes each week. The exercise should increase your heart rate and make you sweat (moderate-intensity exercise). Do strengthening exercises at least twice a week. This is in addition to the moderate-intensity exercise. Spend less time sitting. Even light physical activity can be beneficial. Watch cholesterol and  blood lipids Have your blood tested for lipids and cholesterol at 81 years of age, then have this test every 5 years. You may need to have your cholesterol levels checked more often if: Your lipid or cholesterol levels are high. You are older than 81 years of age. You are at high risk for heart disease. What should I know about cancer screening? Many types of cancers can be detected early and may often be prevented. Depending on your health history and family history, you may need to have cancer screening at various ages. This may include screening for: Colorectal cancer. Prostate cancer. Skin cancer. Lung cancer. What should I know about heart disease, diabetes, and high blood pressure? Blood pressure and heart disease High blood pressure causes heart disease and increases the risk of stroke. This is more likely to develop in people who have high blood pressure readings or are overweight. Talk with your health care provider about your target blood pressure readings. Have your blood pressure checked: Every 3-5 years if you are 79-30 years of age. Every year if you are 1 years old or older. If you are  between the ages of 32 and 32 and are a current or former smoker, ask your health care provider if you should have a one-time screening for abdominal aortic aneurysm (AAA). Diabetes Have regular diabetes screenings. This checks your fasting blood sugar level. Have the screening done: Once every three years after age 43 if you are at a normal weight and have a low risk for diabetes. More often and at a younger age if you are overweight or have a high risk for diabetes. What should I know about preventing infection? Hepatitis B If you have a higher risk for hepatitis B, you should be screened for this virus. Talk with your health care provider to find out if you are at risk for hepatitis B infection. Hepatitis C Blood testing is recommended for: Everyone born from [birth year] through 1965. Anyone  with known risk factors for hepatitis C. Sexually transmitted infections (STIs) You should be screened each year for STIs, including gonorrhea and chlamydia, if: You are sexually active and are younger than 81 years of age. You are older than 81 years of age and your health care provider tells you that you are at risk for this type of infection. Your sexual activity has changed since you were last screened, and you are at increased risk for chlamydia or gonorrhea. Ask your health care provider if you are at risk. Ask your health care provider about whether you are at high risk for HIV. Your health care provider may recommend a prescription medicine to help prevent HIV infection. If you choose to take medicine to prevent HIV, you should first get tested for HIV. You should then be tested every 3 months for as long as you are taking the medicine. Follow these instructions at home: Alcohol use Do not drink alcohol if your health care provider tells you not to drink. If you drink alcohol: Limit how much you have to 0-2 drinks a day. Know how much alcohol is in your drink. In the [place] one drink equals one 12 oz bottle of beer (355 mL), one 5 oz glass of Vashawn Ekstein (148 mL), or one 1 oz glass of hard liquor (44 mL). Lifestyle Do not use any products that contain nicotine or tobacco. These products include cigarettes, chewing tobacco, and vaping devices, such as e-cigarettes. If you need help quitting, ask your health care provider. Do not use street drugs. Do not share needles. Ask your health care provider for help if you need support or information about quitting drugs. General instructions Schedule regular health, dental, and eye exams. Stay current with your vaccines. Tell your health care provider if: You often feel depressed. You have ever been abused or do not feel safe at home. Summary Adopting a healthy lifestyle and getting preventive care are important in promoting health and  wellness. Follow your health care provider's instructions about healthy diet, exercising, and getting tested or screened for diseases. Follow your health care provider's instructions on monitoring your cholesterol and blood pressure. This information is not intended to replace advice given to you by your health care provider. Make sure you discuss any questions you have with your health care provider. Document Revised: 01/08/2021 Document Reviewed: 01/08/2021 Elsevier Patient Education  [place].

## 2022-05-27 NOTE — Progress Notes (Addendum)
Subjective:   Brent Turner is a 81 y.o. male who presents for Medicare Annual/Subsequent preventive examination. I connected with  Brent Turner on 05/28/22 by a audio enabled telemedicine application and verified that I am speaking with the correct person using two identifiers.  Patient Location: Home  Provider Location: Home Office  I discussed the limitations of evaluation and management by telemedicine. The patient expressed understanding and agreed to proceed.  Review of Systems    Deferred to PCP Cardiac Risk Factors include: advanced age (>46men, >68 women)     Objective:    Today's Vitals   05/28/22 1005  PainSc: 2    There is no height or weight on file to calculate BMI.     05/28/2022   10:13 AM 03/20/2022    2:29 PM 04/21/2020    7:08 AM 04/19/2020    1:11 PM 02/17/2020    3:15 PM 05/01/2016    1:40 PM  Advanced Directives  Does Patient Have a Medical Advance Directive? Yes Yes Yes Yes Yes Yes  Type of Estate agent of [place];Living will Living will Living will Living will Living will;Healthcare Power of Attorney   Does patient want to make changes to medical advance directive?  No - Patient declined No - Patient declined No - Patient declined No - Patient declined   Copy of Healthcare Power of Attorney in Chart? No - copy requested    No - copy requested Yes    Current Medications (verified) Outpatient Encounter Medications as of 05/28/2022  Medication Sig   albuterol (VENTOLIN HFA) 108 (90 Base) MCG/ACT inhaler Inhale 2 puffs into the lungs every 6 (six) hours as needed for wheezing or shortness of breath.   allopurinol (ZYLOPRIM) 300 MG tablet TAKE 1 TABLET BY MOUTH EVERY DAY   amLODipine (NORVASC) 5 MG tablet Take 1 tablet (5 mg total) by mouth daily.   Cholecalciferol 1000 UNITS tablet Take 1,000 Units by mouth daily.   methocarbamol (ROBAXIN) 500 MG tablet Take 1.5 tablets (750 mg total) by mouth every 6 (six) hours as needed for muscle  spasms.   oxyCODONE-acetaminophen (PERCOCET) 5-325 MG tablet Take 1-2 tablets by mouth every 4 (four) hours as needed for severe pain.   sildenafil (VIAGRA) 100 MG tablet Take 0.5-1 tablets (50-100 mg total) by mouth daily as needed for erectile dysfunction.   tamsulosin (FLOMAX) 0.4 MG CAPS capsule TAKE 1 CAPSULE BY MOUTH DAILY *NEED APPOINTMENT WITH LABS FOR REFILLS*   No facility-administered encounter medications on file as of 05/28/2022.    Allergies (verified) Tetanus toxoid, adsorbed and Tetanus toxoids   History: Past Medical History:  Diagnosis Date   ED (erectile dysfunction)    Gout    HTN (hypertension)    Vitamin D deficiency    Past Surgical History:  Procedure Laterality Date   COLONOSCOPY     EYE SURGERY Bilateral    cataracts removed   LUMBAR DISC SURGERY Left    Family History  Problem Relation Age of Onset   Stroke Mother 24   Cancer Father 58       lung   Social History   Socioeconomic History   Marital status: Widowed    Spouse name: Not on file   Number of children: 5   Years of education: 12   Highest education level: High school graduate  Occupational History   Occupation: hotel maintenance part time  Tobacco Use   Smoking status: Every Day    Packs/day: 1.00  Years: 50.00    Total pack years: 50.00    Types: Cigarettes   Smokeless tobacco: Never   Tobacco comments:    Nurse discussed 1-800-quit-now  Vaping Use   Vaping Use: Never used  Substance and Sexual Activity   Alcohol use: Yes    Alcohol/week: 7.0 standard drinks of alcohol    Types: 7 Shots of liquor per week    Comment: occasional Ayvah Caroll   Drug use: No   Sexual activity: Not Currently  Other Topics Concern   Not on file  Social History Narrative   Not on file   Social Determinants of Health   Financial Resource Strain: Low Risk  (05/28/2022)   Overall Financial Resource Strain (CARDIA)    Difficulty of Paying Living Expenses: Not hard at all  Food Insecurity: No  Food Insecurity (05/28/2022)   Hunger Vital Sign    Worried About Running Out of Food in the Last Year: Never true    Ran Out of Food in the Last Year: Never true  Transportation Needs: No Transportation Needs (05/28/2022)   PRAPARE - Administrator, Civil Service (Medical): No    Lack of Transportation (Non-Medical): No  Physical Activity: Inactive (05/28/2022)   Exercise Vital Sign    Days of Exercise per Week: 0 days    Minutes of Exercise per Session: 0 min  Stress: No Stress Concern Present (05/28/2022)   [employer] of Occupational Health - Occupational Stress Questionnaire    Feeling of Stress : Not at all  Social Connections: Moderately Integrated (05/28/2022)   Social Connection and Isolation Panel [NHANES]    Frequency of Communication with Friends and Family: Twice a week    Frequency of Social Gatherings with Friends and Family: Twice a week    Attends Religious Services: 1 to 4 times per year    Active Member of [employer] or Organizations: No    Attends Banker Meetings: 1 to 4 times per year    Marital Status: Widowed    Tobacco Counseling Ready to quit: Yes Counseling given: Yes Tobacco comments: Nurse discussed 1-800-quit-now   Clinical Intake:  Pre-visit preparation completed: Yes  Pain : 0-10 Pain Score: 2  Pain Type: Chronic pain Pain Location: Back Pain Orientation: Lower Pain Descriptors / Indicators: Dull     Nutritional Status: BMI <19  Underweight Nutritional Risks: Other (Comment) (patient reports poor appetite and states he supplements with Boost) Diabetes: No  How often do you need to have someone help you when you read instructions, pamphlets, or other written materials from your doctor or pharmacy?: 1 - Never What is the last grade level you completed in school?: high school  Diabetic?No  Interpreter Needed?: No  Information entered by :: Blanchie Serve RN   Activities of Daily Living    05/28/2022   10:12 AM  03/20/2022    2:36 PM  In your present state of health, do you have any difficulty performing the following activities:  Hearing? 0   Vision? 0   Difficulty concentrating or making decisions? 0   Walking or climbing stairs? 0   Dressing or bathing? 0   Doing errands, shopping? 0 0  Preparing Food and eating ? N   Using the Toilet? N   In the past six months, have you accidently leaked urine? N   Do you have problems with loss of bowel control? N   Managing your Medications? N   Managing your Finances? N   Housekeeping  or managing your Housekeeping? N     Patient Care Team: [name], Georgina Quint, MD as PCP - General Radionchenko, Lucious Groves, MD as Referring Physician (Ophthalmology) Hilarie Fredrickson, MD as Consulting Physician (Gastroenterology) Dawley, Alan Mulder, DO as Consulting Physician  Indicate any recent Medical Services you may have received from other than [facility] providers in the past year (date may be approximate).     Assessment:   This is a routine wellness examination for [place].  Hearing/Vision screen No results found.  Dietary issues and exercise activities discussed: Current Exercise Habits: The patient does not participate in regular exercise at present, Exercise limited by: orthopedic condition(s)   Goals Addressed             This Visit's Progress    Patient Stated       Maintain my current health and wellness status.      Depression Screen    05/28/2022   10:25 AM 03/19/2022    3:46 PM 01/08/2022    3:33 PM 08/15/2021    4:07 PM 02/17/2020    3:16 PM 10/29/2018    3:08 PM 07/09/2017    3:02 PM  PHQ 2/9 Scores  PHQ - 2 Score 0 0 1 0 0 0 0  PHQ- 9 Score   8 0       Fall Risk    05/28/2022   10:14 AM 03/19/2022    3:46 PM 01/08/2022    3:33 PM 08/15/2021    4:07 PM 02/17/2020    3:16 PM  Fall Risk   Falls in the past year? 0 0 0 0 0  Number falls in past yr: 0  0 0 0  Injury with Fall? 0 0 0 0 0  Risk for fall due to : History of fall(s);Impaired  balance/gait;Impaired mobility;Impaired vision  No Fall Risks No Fall Risks No Fall Risks  Follow up Falls evaluation completed  Falls evaluation completed  Falls evaluation completed    FALL RISK PREVENTION PERTAINING TO THE HOME:  Any stairs in or around the home? Yes  If so, are there any without handrails? Yes  Home free of loose throw rugs in walkways, pet beds, electrical cords, etc? Yes  Adequate lighting in your home to reduce risk of falls? Yes   ASSISTIVE DEVICES UTILIZED TO PREVENT FALLS:  Life alert? No  Use of a cane, walker or w/c? Yes  Grab bars in the bathroom? No  Shower chair or bench in shower? No  Elevated toilet seat or a handicapped toilet? No    Cognitive Function:        05/28/2022   10:15 AM  6CIT Screen  What Year? 0 points  What time? 0 points  Count back from 20 0 points  Months in reverse 2 points  Repeat phrase 0 points    Immunizations Immunization History  Administered Date(s) Administered   Fluad Quad(high Dose 65+) 08/19/2019   Influenza Split 07/13/2012   Influenza Whole 07/31/2010, 05/14/2011   Influenza, High Dose Seasonal PF 07/16/2013, 05/01/2016, 07/09/2017   Influenza,inj,Quad PF,6+ Mos 07/26/2014   Moderna Covid-19 Vaccine Bivalent Booster 95yrs & up 08/22/2020   Moderna Sars-Covid-2 Vaccination 02/02/2020, 03/09/2020   Pneumococcal Conjugate-13 12/28/2020   Pneumococcal Polysaccharide-23 01/28/2007    TDAP status: Due, Education has been provided regarding the importance of this vaccine. Advised may receive this vaccine at local pharmacy or Health Dept. Aware to provide a copy of the vaccination record if obtained from local pharmacy or Health  Dept. Verbalized acceptance and understanding.  Flu Vaccine status: Due, Education has been provided regarding the importance of this vaccine. Advised may receive this vaccine at local pharmacy or Health Dept. Aware to provide a copy of the vaccination record if obtained from local  pharmacy or Health Dept. Verbalized acceptance and understanding.  Pneumococcal vaccine status: Up to date  Covid-19 vaccine status: Information provided on how to obtain vaccines.   Qualifies for Shingles Vaccine? Yes   Zostavax completed No   Shingrix Completed?: No.    Education has been provided regarding the importance of this vaccine. Patient has been advised to call insurance company to determine out of pocket expense if they have not yet received this vaccine. Advised may also receive vaccine at local pharmacy or Health Dept. Verbalized acceptance and understanding.  Screening Tests Health Maintenance  Topic Date Due   TETANUS/TDAP  Never done   Zoster Vaccines- Shingrix (1 of 2) Never done   COVID-19 Vaccine (4 - Moderna series) 12/21/2020   INFLUENZA VACCINE  12/01/2022 (Originally 04/02/2022)   Pneumonia Vaccine 6565+ Years old  Completed   HPV VACCINES  Aged Out    Health Maintenance  Health Maintenance Due  Topic Date Due   TETANUS/TDAP  Never done   Zoster Vaccines- Shingrix (1 of 2) Never done   COVID-19 Vaccine (4 - Moderna series) 12/21/2020    Colorectal cancer screening: No longer required.   Lung Cancer Screening: (Low Dose CT Chest recommended if Age 50-80 years, 30 pack-year currently smoking OR have quit w/in 15years.) does qualify.   Lung Cancer Screening Referral: Deferred to PCP  Additional Screening:  Hepatitis C Screening: does qualify  Vision Screening: Recommended annual ophthalmology exams for early detection of glaucoma and other disorders of the eye. Is the patient up to date with their annual eye exam?  No  Who is the provider or what is the name of the office in which the patient attends annual eye exams? Patient states he cannot remember name If pt is not established with a provider, would they like to be referred to a provider to establish care? No , patient declines referral.    Dental Screening: Recommended annual dental exams for proper  oral hygiene  Community Resource Referral / Chronic Care Management: CRR required this visit?  No   CCM required this visit?  No      Plan:     I have personally reviewed and noted the following in the patient's chart:   Medical and social history Use of alcohol, tobacco or illicit drugs  Current medications and supplements including opioid prescriptions. Patient is currently taking opioid prescriptions. Information provided to patient regarding non-opioid alternatives. Patient advised to discuss non-opioid treatment plan with their provider. Functional ability and status Nutritional status Physical activity Advanced directives List of other physicians Hospitalizations, surgeries, and ER visits in previous 12 months Vitals Screenings to include cognitive, depression, and falls Referrals and appointments  In addition, I have reviewed and discussed with patient certain preventive protocols, quality metrics, and best practice recommendations. A written personalized care plan for preventive services as well as general preventive health recommendations were provided to patient.     Wanda PlumpJill A Farra Nikolic, RN   05/28/2022   Nurse Notes:  Mr. Pearline CablesMaynor , Thank you for taking time to come for your Medicare Wellness Visit. I appreciate your ongoing commitment to your health goals. Please review the following plan we discussed and let me know if I can assist you in  the future.   These are the goals we discussed:  Goals      Patient Stated     Maintain my current health and wellness status.     Quit smoking / using tobacco     Could try to stop smoking Think about the benefits of quitting; may increase appetite as food will have more taste  Smoking;  Educated to avoid secondary smoke Smoking cessation at [facility]: [phone]  Meds may help; chatix (Varenicline); Zyban (Bupropion SR); Nicotine Replacement (gum; lozenges; patches; etc.)  30 pack yr smoking hx: Educated regarding LDCT (low dose  CT) ; To discuss with MD at next fup. Also educated on AAA screening for men 65-75 who have smoked  [place] quit line information give         This is a list of the screening recommended for you and due dates:  Health Maintenance  Topic Date Due   Tetanus Vaccine  Never done   Zoster (Shingles) Vaccine (1 of 2) Never done   COVID-19 Vaccine (4 - Moderna series) 12/21/2020   Flu Shot  12/01/2022*   Pneumonia Vaccine  Completed   HPV Vaccine  Aged Out  *Topic was postponed. The date shown is not the original due date.     Medical screening examination/treatment/procedure(s) were performed by non-physician practitioner and as supervising physician I was immediately available for consultation/collaboration.  I agree with above. Lew Dawes, MD

## 2022-05-28 ENCOUNTER — Ambulatory Visit (INDEPENDENT_AMBULATORY_CARE_PROVIDER_SITE_OTHER): Payer: Medicare Other | Admitting: *Deleted

## 2022-05-28 DIAGNOSIS — Z0000 Encounter for general adult medical examination without abnormal findings: Secondary | ICD-10-CM

## 2022-06-19 ENCOUNTER — Other Ambulatory Visit: Payer: Self-pay | Admitting: Internal Medicine

## 2022-07-10 DIAGNOSIS — M48062 Spinal stenosis, lumbar region with neurogenic claudication: Secondary | ICD-10-CM | POA: Diagnosis not present

## 2022-07-10 DIAGNOSIS — Z681 Body mass index (BMI) 19 or less, adult: Secondary | ICD-10-CM | POA: Diagnosis not present

## 2022-07-10 DIAGNOSIS — M5442 Lumbago with sciatica, left side: Secondary | ICD-10-CM | POA: Diagnosis not present

## 2022-09-16 DIAGNOSIS — M48062 Spinal stenosis, lumbar region with neurogenic claudication: Secondary | ICD-10-CM | POA: Diagnosis not present

## 2022-10-10 DIAGNOSIS — M5416 Radiculopathy, lumbar region: Secondary | ICD-10-CM | POA: Diagnosis not present

## 2022-10-10 DIAGNOSIS — M4326 Fusion of spine, lumbar region: Secondary | ICD-10-CM | POA: Diagnosis not present

## 2022-10-15 DIAGNOSIS — M4326 Fusion of spine, lumbar region: Secondary | ICD-10-CM | POA: Diagnosis not present

## 2022-10-15 DIAGNOSIS — M5416 Radiculopathy, lumbar region: Secondary | ICD-10-CM | POA: Diagnosis not present

## 2022-10-17 DIAGNOSIS — M4326 Fusion of spine, lumbar region: Secondary | ICD-10-CM | POA: Diagnosis not present

## 2022-10-17 DIAGNOSIS — M5416 Radiculopathy, lumbar region: Secondary | ICD-10-CM | POA: Diagnosis not present

## 2022-10-22 DIAGNOSIS — M5416 Radiculopathy, lumbar region: Secondary | ICD-10-CM | POA: Diagnosis not present

## 2022-10-22 DIAGNOSIS — M4326 Fusion of spine, lumbar region: Secondary | ICD-10-CM | POA: Diagnosis not present

## 2022-10-23 ENCOUNTER — Other Ambulatory Visit: Payer: Self-pay | Admitting: Internal Medicine

## 2022-10-24 DIAGNOSIS — M5416 Radiculopathy, lumbar region: Secondary | ICD-10-CM | POA: Diagnosis not present

## 2022-10-24 DIAGNOSIS — M4326 Fusion of spine, lumbar region: Secondary | ICD-10-CM | POA: Diagnosis not present

## 2022-10-29 DIAGNOSIS — M4326 Fusion of spine, lumbar region: Secondary | ICD-10-CM | POA: Diagnosis not present

## 2022-10-29 DIAGNOSIS — M5416 Radiculopathy, lumbar region: Secondary | ICD-10-CM | POA: Diagnosis not present

## 2022-12-02 ENCOUNTER — Encounter: Payer: Self-pay | Admitting: Internal Medicine

## 2022-12-02 ENCOUNTER — Ambulatory Visit (INDEPENDENT_AMBULATORY_CARE_PROVIDER_SITE_OTHER): Payer: Medicare Other | Admitting: Internal Medicine

## 2022-12-02 ENCOUNTER — Ambulatory Visit (INDEPENDENT_AMBULATORY_CARE_PROVIDER_SITE_OTHER): Payer: Medicare Other

## 2022-12-02 VITALS — BP 134/78 | HR 85 | Temp 98.3°F | Ht 72.0 in | Wt 133.0 lb

## 2022-12-02 DIAGNOSIS — R972 Elevated prostate specific antigen [PSA]: Secondary | ICD-10-CM | POA: Diagnosis not present

## 2022-12-02 DIAGNOSIS — M48062 Spinal stenosis, lumbar region with neurogenic claudication: Secondary | ICD-10-CM | POA: Diagnosis not present

## 2022-12-02 DIAGNOSIS — E785 Hyperlipidemia, unspecified: Secondary | ICD-10-CM

## 2022-12-02 DIAGNOSIS — M5386 Other specified dorsopathies, lumbar region: Secondary | ICD-10-CM | POA: Diagnosis not present

## 2022-12-02 DIAGNOSIS — N529 Male erectile dysfunction, unspecified: Secondary | ICD-10-CM

## 2022-12-02 DIAGNOSIS — M5430 Sciatica, unspecified side: Secondary | ICD-10-CM | POA: Diagnosis not present

## 2022-12-02 LAB — CBC WITH DIFFERENTIAL/PLATELET
Basophils Absolute: 0 10*3/uL (ref 0.0–0.1)
Basophils Relative: 0.3 % (ref 0.0–3.0)
Eosinophils Absolute: 0.1 10*3/uL (ref 0.0–0.7)
Eosinophils Relative: 0.9 % (ref 0.0–5.0)
HCT: 42 % (ref 39.0–52.0)
Hemoglobin: 14.3 g/dL (ref 13.0–17.0)
Lymphocytes Relative: 24.3 % (ref 12.0–46.0)
Lymphs Abs: 2.7 10*3/uL (ref 0.7–4.0)
MCHC: 34 g/dL (ref 30.0–36.0)
MCV: 100.5 fl — ABNORMAL HIGH (ref 78.0–100.0)
Monocytes Absolute: 0.6 10*3/uL (ref 0.1–1.0)
Monocytes Relative: 5.7 % (ref 3.0–12.0)
Neutro Abs: 7.7 10*3/uL (ref 1.4–7.7)
Neutrophils Relative %: 68.8 % (ref 43.0–77.0)
Platelets: 334 10*3/uL (ref 150.0–400.0)
RBC: 4.18 Mil/uL — ABNORMAL LOW (ref 4.22–5.81)
RDW: 14.8 % (ref 11.5–15.5)
WBC: 11.2 10*3/uL — ABNORMAL HIGH (ref 4.0–10.5)

## 2022-12-02 LAB — URINALYSIS
Bilirubin Urine: NEGATIVE
Hgb urine dipstick: NEGATIVE
Leukocytes,Ua: NEGATIVE
Nitrite: NEGATIVE
Specific Gravity, Urine: 1.02 (ref 1.000–1.030)
Total Protein, Urine: NEGATIVE
Urine Glucose: NEGATIVE
Urobilinogen, UA: 0.2 (ref 0.0–1.0)
pH: 6 (ref 5.0–8.0)

## 2022-12-02 LAB — LIPID PANEL
Cholesterol: 183 mg/dL (ref 0–200)
HDL: 47.6 mg/dL (ref >39.00)
LDL Cholesterol: 111 mg/dL — ABNORMAL HIGH (ref 0–99)
NonHDL: 135.79
Total CHOL/HDL Ratio: 4
Triglycerides: 125 mg/dL (ref 0.0–149.0)
VLDL: 25 mg/dL (ref 0.0–40.0)

## 2022-12-02 LAB — COMPREHENSIVE METABOLIC PANEL
ALT: 18 U/L (ref 0–53)
AST: 23 U/L (ref 0–37)
Albumin: 4 g/dL (ref 3.5–5.2)
Alkaline Phosphatase: 70 U/L (ref 39–117)
BUN: 19 mg/dL (ref 6–23)
CO2: 25 mEq/L (ref 19–32)
Calcium: 9.5 mg/dL (ref 8.4–10.5)
Chloride: 103 mEq/L (ref 96–112)
Creatinine, Ser: 1.09 mg/dL (ref 0.40–1.50)
GFR: 63.72 mL/min (ref >60.00)
Glucose, Bld: 101 mg/dL — ABNORMAL HIGH (ref 70–99)
Potassium: 4.4 mEq/L (ref 3.5–5.1)
Sodium: 134 mEq/L — ABNORMAL LOW (ref 135–145)
Total Bilirubin: 0.4 mg/dL (ref 0.2–1.2)
Total Protein: 7 g/dL (ref 6.0–8.3)

## 2022-12-02 LAB — TSH: TSH: 3.01 u[IU]/mL (ref 0.35–5.50)

## 2022-12-02 LAB — TESTOSTERONE: Testosterone: 286.4 ng/dL — ABNORMAL LOW (ref 300.00–890.00)

## 2022-12-02 MED ORDER — METHYLPREDNISOLONE 4 MG PO TBPK: ORAL_TABLET | ORAL | 0 refills | Status: DC

## 2022-12-02 MED ORDER — HYDROCODONE-ACETAMINOPHEN 10-325 MG PO TABS: 1.0000 | ORAL_TABLET | Freq: Four times a day (QID) | ORAL | 0 refills | Status: DC | PRN

## 2022-12-02 MED ORDER — TADALAFIL 20 MG PO TABS: 20.0000 mg | ORAL_TABLET | Freq: Every day | ORAL | 3 refills | Status: DC | PRN

## 2022-12-02 NOTE — Assessment & Plan Note (Addendum)
Worse Medrol pac F/u w/Dr Dawley X ray - LS spine Norco prn  Potential benefits of a long term opioids use as well as potential risks (i.e. addiction risk, apnea etc) and complications (i.e. Somnolence, constipation and others) were explained to the patient and were aknowledged.

## 2022-12-02 NOTE — Progress Notes (Signed)
Subjective:  Patient ID: Brent Turner, male    DOB: 12/22/1940  Age: 82 y.o. MRN: [id number]  CC: Hip Pain (LEFT HIP PAIN SINCE SURGERY)   HPI Brent Turner presents for L LBP and L hip pain, LLE weakness - worse Pt had PT last month, saw Dr Reatha Armour in January. C/o ED F/u on elevated PSA  Outpatient Medications Prior to Visit  Medication Sig Dispense Refill   albuterol (VENTOLIN HFA) 108 (90 Base) MCG/ACT inhaler Inhale 2 puffs into the lungs every 6 (six) hours as needed for wheezing or shortness of breath. 18 g 3   allopurinol (ZYLOPRIM) 300 MG tablet TAKE 1 TABLET BY MOUTH EVERY DAY 90 tablet 2   amLODipine (NORVASC) 5 MG tablet Take 1 tablet (5 mg total) by mouth daily. 90 tablet 3   Cholecalciferol 1000 UNITS tablet Take 1,000 Units by mouth daily.     methocarbamol (ROBAXIN) 500 MG tablet Take 1.5 tablets (750 mg total) by mouth every 6 (six) hours as needed for muscle spasms. 90 tablet 3   sildenafil (VIAGRA) 100 MG tablet Take 0.5-1 tablets (50-100 mg total) by mouth daily as needed for erectile dysfunction. 12 tablet 11   tamsulosin (FLOMAX) 0.4 MG CAPS capsule TAKE 1 CAPSULE BY MOUTH DAILY *NEED APPOINTMENT WITH LABS FOR REFILLS* 90 capsule 1   HYDROcodone-acetaminophen (NORCO) 10-325 MG tablet Take by mouth.     oxyCODONE-acetaminophen (PERCOCET) 5-325 MG tablet Take 1-2 tablets by mouth every 4 (four) hours as needed for severe pain. 40 tablet 0   No facility-administered medications prior to visit.    ROS: Review of Systems  Constitutional:  Negative for appetite change, fatigue and unexpected weight change.  HENT:  Negative for congestion, nosebleeds, sneezing, sore throat and trouble swallowing.   Eyes:  Negative for itching and visual disturbance.  Respiratory:  Negative for cough.   Cardiovascular:  Negative for chest pain, palpitations and leg swelling.  Gastrointestinal:  Negative for abdominal distention, blood in stool, diarrhea and nausea.  Genitourinary:   Negative for frequency and hematuria.  Musculoskeletal:  Positive for back pain and gait problem. Negative for joint swelling and neck pain.  Skin:  Negative for rash.  Neurological:  Negative for dizziness, tremors, speech difficulty and weakness.  Hematological:  Does not bruise/bleed easily.  Psychiatric/Behavioral:  Negative for agitation, dysphoric mood and sleep disturbance. The patient is not nervous/anxious.     Objective:  BP 134/78 (BP Location: Right Arm, Patient Position: Sitting, Cuff Size: Large)   Pulse 85   Temp 98.3 F (36.8 C) (Oral)   Ht 6' (1.829 m)   Wt 133 lb (60.3 kg)   SpO2 98%   BMI 18.04 kg/m   BP Readings from Last 3 Encounters:  12/02/22 134/78  03/27/22 140/77  03/20/22 137/80    Wt Readings from Last 3 Encounters:  12/02/22 133 lb (60.3 kg)  03/26/22 135 lb (61.2 kg)  03/20/22 135 lb 6.4 oz (61.4 kg)    Physical Exam Constitutional:      General: He is not in acute distress.    Appearance: He is well-developed.     Comments: NAD  Eyes:     Conjunctiva/sclera: Conjunctivae normal.     Pupils: Pupils are equal, round, and reactive to light.  Neck:     Thyroid: No thyromegaly.     Vascular: No JVD.  Cardiovascular:     Rate and Rhythm: Normal rate and regular rhythm.     Heart sounds: Normal  heart sounds. No murmur heard.    No friction rub. No gallop.  Pulmonary:     Effort: Pulmonary effort is normal. No respiratory distress.     Breath sounds: Normal breath sounds. No wheezing or rales.  Chest:     Chest wall: No tenderness.  Abdominal:     General: Bowel sounds are normal. There is no distension.     Palpations: Abdomen is soft. There is no mass.     Tenderness: There is no abdominal tenderness. There is no guarding or rebound.  Musculoskeletal:        General: No tenderness. Normal range of motion.     Cervical back: Normal range of motion.  Lymphadenopathy:     Cervical: No cervical adenopathy.  Skin:    General: Skin is  warm and dry.     Findings: No rash.  Neurological:     Mental Status: He is alert and oriented to person, place, and time.     Cranial Nerves: No cranial nerve deficit.     Motor: No abnormal muscle tone.     Coordination: Coordination normal.     Gait: Gait normal.     Deep Tendon Reflexes: Reflexes are normal and symmetric.  Psychiatric:        Behavior: Behavior normal.        Thought Content: Thought content normal.        Judgment: Judgment normal.   L LS spine w/pain Thin Strait leg elev (+/-) L   Lab Results  Component Value Date   WBC 8.1 03/20/2022   HGB 13.8 03/20/2022   HCT 39.0 03/20/2022   PLT 253 03/20/2022   GLUCOSE 96 03/20/2022   CHOL 199 01/08/2022   TRIG 163.0 (H) 01/08/2022   HDL 61.10 01/08/2022   LDLDIRECT 106.0 02/14/2020   LDLCALC 105 (H) 01/08/2022   ALT 27 01/08/2022   AST 27 01/08/2022   NA 142 03/20/2022   K 3.9 03/20/2022   CL 108 03/20/2022   CREATININE 0.85 03/20/2022   BUN 11 03/20/2022   CO2 27 03/20/2022   TSH 3.32 01/08/2022   PSA 5.11 (H) 01/08/2022   HGBA1C 7.0 (H) 07/09/2017    DG Lumbar Spine 2-3 Views  Result Date: 03/26/2022 CLINICAL DATA:  Extension to L3-L4 PLIF EXAM: LUMBAR SPINE - 2 VIEW COMPARISON:  Lumbar spine radiograph dated February 05, 2021 FINDINGS: Fluoroscopic images were obtained intraoperatively and submitted for post operative interpretation. 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 Prior posterior fusion of L4-S1. Please see the performing provider's procedural report for further detail. IMPRESSION: Fluoroscopic images demonstrate extension of posterior fusion to L3-L4. Electronically Signed   By: Yetta Glassman M.D.   On: 03/26/2022 11:34   DG C-Arm 1-60 Min-No Report  Result Date: 03/26/2022 Fluoroscopy was utilized by the requesting physician.  No radiographic interpretation.   DG C-Arm 1-60 Min-No  Report  Result Date: 03/26/2022 Fluoroscopy was utilized by the requesting physician.  No radiographic interpretation.   DG C-Arm 1-60 Min-No Report  Result Date: 03/26/2022 Fluoroscopy was utilized by the requesting physician.  No radiographic interpretation.   DG C-Arm 1-60 Min-No Report  Result Date: 03/26/2022 Fluoroscopy was utilized by the requesting physician.  No radiographic interpretation.    Assessment & Plan:   Problem List Items Addressed This Visit       Nervous and Auditory   Sciatica of left  side associated with disorder of lumbar spine    Worse Medrol pac F/u w/Dr Dawley X ray - LS spine Norco prn  Potential benefits of a long term opioids use as well as potential risks (i.e. addiction risk, apnea etc) and complications (i.e. Somnolence, constipation and others) were explained to the patient and were aknowledged.       Sciatic leg pain - Primary    Worse Medrol pac F/u w/Dr Dawley X ray - LS spine Norco prn         Relevant Orders   PSA, total and free   Testosterone   TSH   Comprehensive metabolic panel   Lipid panel   Urinalysis   CBC with Differential/Platelet   DG Lumbar Spine 2-3 Views     Other   Erectile dysfunction    Check testosterone Viagra is not working Cialis prn      Relevant Orders   Testosterone   CBC with Differential/Platelet   Elevated PSA    Check PSA      Relevant Orders   PSA, total and free   Testosterone   CBC with Differential/Platelet   Dyslipidemia   Relevant Orders   TSH   Lipid panel      Meds ordered this encounter  Medications   tadalafil (CIALIS) 20 MG tablet    Sig: Take 1 tablet (20 mg total) by mouth daily as needed for erectile dysfunction.    Dispense:  10 tablet    Refill:  3   methylPREDNISolone (MEDROL DOSEPAK) 4 MG TBPK tablet    Sig: As directed    Dispense:  21 tablet    Refill:  0   HYDROcodone-acetaminophen (NORCO) 10-325 MG tablet    Sig: Take 1 tablet by mouth every 6  (six) hours as needed. Take by mouth.    Dispense:  60 tablet    Refill:  0      Follow-up: Return in about 6 weeks (around 01/13/2023) for a follow-up visit.  Walker Kehr, MD

## 2022-12-02 NOTE — Assessment & Plan Note (Signed)
Worse Medrol pac F/u w/Dr Dawley X ray - LS spine Norco prn

## 2022-12-02 NOTE — Assessment & Plan Note (Signed)
Check PSA. ?

## 2022-12-02 NOTE — Assessment & Plan Note (Signed)
Check testosterone Viagra is not working Cialis prn

## 2022-12-02 NOTE — Assessment & Plan Note (Signed)
Norco prn   A total time of 45 minutes was spent preparing to see the patient, reviewing tests, x-rays, operative reports and other medical records.  Also, obtaining history and performing comprehensive physical exam.  Additionally, counseling the patient regarding the above listed issues.   Finally, documenting clinical information in the health records, coordination of care, educating the patient. It is a complex case.

## 2022-12-02 NOTE — Addendum Note (Signed)
Addended by: Cassandria Anger on: 12/02/2022 11:45 AM   Modules accepted: Orders

## 2022-12-04 LAB — PSA, TOTAL AND FREE
PSA, % Free: 17 % (calc) — ABNORMAL LOW (ref >25)
PSA, Free: 0.5 ng/mL
PSA, Total: 3 ng/mL (ref <=4.0)

## 2022-12-07 ENCOUNTER — Other Ambulatory Visit: Payer: Self-pay | Admitting: Internal Medicine

## 2023-01-14 ENCOUNTER — Other Ambulatory Visit: Payer: Self-pay | Admitting: Internal Medicine

## 2023-01-21 ENCOUNTER — Ambulatory Visit (INDEPENDENT_AMBULATORY_CARE_PROVIDER_SITE_OTHER): Payer: Medicare Other | Admitting: Internal Medicine

## 2023-01-21 ENCOUNTER — Telehealth: Payer: Self-pay | Admitting: Internal Medicine

## 2023-01-21 ENCOUNTER — Encounter: Payer: Self-pay | Admitting: Internal Medicine

## 2023-01-21 VITALS — BP 118/78 | HR 87 | Temp 98.2°F | Ht 72.0 in | Wt 134.0 lb

## 2023-01-21 DIAGNOSIS — R972 Elevated prostate specific antigen [PSA]: Secondary | ICD-10-CM | POA: Diagnosis not present

## 2023-01-21 DIAGNOSIS — R634 Abnormal weight loss: Secondary | ICD-10-CM

## 2023-01-21 DIAGNOSIS — E291 Testicular hypofunction: Secondary | ICD-10-CM | POA: Diagnosis not present

## 2023-01-21 DIAGNOSIS — N529 Male erectile dysfunction, unspecified: Secondary | ICD-10-CM

## 2023-01-21 MED ORDER — BD ECLIPSE SYRINGE/NEEDLE 23G X 1" 3 ML MISC
0 refills | Status: DC
Start: 2023-01-21 — End: 2023-05-01

## 2023-01-21 MED ORDER — HYDROCODONE-ACETAMINOPHEN 10-325 MG PO TABS: 1.0000 | ORAL_TABLET | Freq: Four times a day (QID) | ORAL | 0 refills | Status: DC | PRN

## 2023-01-21 MED ORDER — TESTOSTERONE CYPIONATE 200 MG/ML IM SOLN: 200.0000 mg | INTRAMUSCULAR | 1 refills | Status: DC

## 2023-01-21 MED ORDER — "BD ECLIPSE SYRINGE/NEEDLE 23G X 1"" 3 ML MISC": 0 refills | Status: DC

## 2023-01-21 MED ORDER — TADALAFIL 20 MG PO TABS
20.0000 mg | ORAL_TABLET | Freq: Every day | ORAL | 3 refills | Status: DC | PRN
Start: 2023-01-21 — End: 2023-01-21

## 2023-01-21 MED ORDER — TADALAFIL 20 MG PO TABS: 20.0000 mg | ORAL_TABLET | Freq: Every day | ORAL | 3 refills | Status: DC | PRN

## 2023-01-21 NOTE — Telephone Encounter (Signed)
I emailed prescription to his [facility] instead.  Thanks

## 2023-01-21 NOTE — Assessment & Plan Note (Signed)
Monitor PSA 

## 2023-01-21 NOTE — Progress Notes (Signed)
Subjective:  Patient ID: Brent Turner, male    DOB: [birth year]  Age: 82 y.o. MRN: [id number]  CC: Follow-up (6 week f/u)   HPI Brent Turner presents for LBP, wt loss C/o ED - not better C/o low testosterone  Outpatient Medications Prior to Visit  Medication Sig Dispense Refill   allopurinol (ZYLOPRIM) 300 MG tablet TAKE 1 TABLET BY MOUTH EVERY DAY 90 tablet 2   amLODipine (NORVASC) 5 MG tablet Take 1 tablet (5 mg total) by mouth daily. 90 tablet 3   Cholecalciferol 1000 UNITS tablet Take 1,000 Units by mouth daily.     methocarbamol (ROBAXIN) 500 MG tablet Take 1.5 tablets (750 mg total) by mouth every 6 (six) hours as needed for muscle spasms. 90 tablet 3   tamsulosin (FLOMAX) 0.4 MG CAPS capsule TAKE 1 CAPSULE BY MOUTH DAILY *NEED APPOINTMENT WITH LABS FOR REFILLS* 90 capsule 1   HYDROcodone-acetaminophen (NORCO) 10-325 MG tablet Take 1 tablet by mouth every 6 (six) hours as needed. Take by mouth. 60 tablet 0   sildenafil (VIAGRA) 100 MG tablet Take 0.5-1 tablets (50-100 mg total) by mouth daily as needed for erectile dysfunction. 12 tablet 11   tadalafil (CIALIS) 10 MG tablet TAKE 1 TABLET (10 MG TOTAL) BY MOUTH EVERY OTHER DAY AS NEEDED FOR ERECTILE DYSFUNCTION. 10 tablet 3   albuterol (VENTOLIN HFA) 108 (90 Base) MCG/ACT inhaler Inhale 2 puffs into the lungs every 6 (six) hours as needed for wheezing or shortness of breath. 18 g 3   methylPREDNISolone (MEDROL DOSEPAK) 4 MG TBPK tablet As directed 21 tablet 0   tadalafil (CIALIS) 20 MG tablet Take 1 tablet (20 mg total) by mouth daily as needed for erectile dysfunction. 10 tablet 3   No facility-administered medications prior to visit.    ROS: Review of Systems  Constitutional:  Positive for fatigue. Negative for appetite change and unexpected weight change.  HENT:  Negative for congestion, nosebleeds, sneezing, sore throat and trouble swallowing.   Eyes:  Negative for itching and visual disturbance.  Respiratory:  Negative  for cough.   Cardiovascular:  Negative for chest pain, palpitations and leg swelling.  Gastrointestinal:  Negative for abdominal distention, blood in stool, diarrhea and nausea.  Genitourinary:  Negative for frequency and hematuria.  Musculoskeletal:  Positive for back pain and gait problem. Negative for joint swelling and neck pain.  Skin:  Negative for rash.  Neurological:  Negative for dizziness, tremors, speech difficulty and weakness.  Psychiatric/Behavioral:  Negative for agitation, dysphoric mood and sleep disturbance. The patient is not nervous/anxious.     Objective:  BP 118/78 (BP Location: Left Arm, Patient Position: Sitting, Cuff Size: Large)   Pulse 87   Temp 98.2 F (36.8 C) (Oral)   Ht 6' (1.829 m)   Wt 134 lb (60.8 kg)   SpO2 96%   BMI 18.17 kg/m   BP Readings from Last 3 Encounters:  01/21/23 118/78  12/02/22 134/78  03/27/22 140/77    Wt Readings from Last 3 Encounters:  01/21/23 134 lb (60.8 kg)  12/02/22 133 lb (60.3 kg)  03/26/22 135 lb (61.2 kg)    Physical Exam Constitutional:      General: He is not in acute distress.    Appearance: Normal appearance. He is well-developed. He is not toxic-appearing.     Comments: NAD  Eyes:     Conjunctiva/sclera: Conjunctivae normal.     Pupils: Pupils are equal, round, and reactive to light.  Neck:  Thyroid: No thyromegaly.     Vascular: No JVD.  Cardiovascular:     Rate and Rhythm: Normal rate and regular rhythm.     Heart sounds: Normal heart sounds. No murmur heard.    No friction rub. No gallop.  Pulmonary:     Effort: Pulmonary effort is normal. No respiratory distress.     Breath sounds: Normal breath sounds. No wheezing or rales.  Chest:     Chest wall: No tenderness.  Abdominal:     General: Bowel sounds are normal. There is no distension.     Palpations: Abdomen is soft. There is no mass.     Tenderness: There is no abdominal tenderness. There is no guarding or rebound.  Musculoskeletal:         General: Tenderness present. Normal range of motion.     Cervical back: Normal range of motion.  Lymphadenopathy:     Cervical: No cervical adenopathy.  Skin:    General: Skin is warm and dry.     Findings: No rash.  Neurological:     Mental Status: He is alert and oriented to person, place, and time.     Cranial Nerves: No cranial nerve deficit.     Motor: No abnormal muscle tone.     Coordination: Coordination normal.     Gait: Gait normal.     Deep Tendon Reflexes: Reflexes are normal and symmetric.  Psychiatric:        Behavior: Behavior normal.        Thought Content: Thought content normal.        Judgment: Judgment normal.   Thin LS w/pain  Lab Results  Component Value Date   WBC 11.2 (H) 12/02/2022   HGB 14.3 12/02/2022   HCT 42.0 12/02/2022   PLT 334.0 12/02/2022   GLUCOSE 101 (H) 12/02/2022   CHOL 183 12/02/2022   TRIG 125.0 12/02/2022   HDL 47.60 12/02/2022   LDLDIRECT 106.0 02/14/2020   LDLCALC 111 (H) 12/02/2022   ALT 18 12/02/2022   AST 23 12/02/2022   NA 134 (L) 12/02/2022   K 4.4 12/02/2022   CL 103 12/02/2022   CREATININE 1.09 12/02/2022   BUN 19 12/02/2022   CO2 25 12/02/2022   TSH 3.01 12/02/2022   PSA 5.11 (H) 01/08/2022   HGBA1C 7.0 (H) 07/09/2017    DG Lumbar Spine 2-3 Views  Result Date: 03/26/2022 CLINICAL DATA:  Extension to L3-L4 PLIF EXAM: LUMBAR SPINE - 2 VIEW COMPARISON:  Lumbar spine radiograph dated February 05, 2021 FINDINGS: Fluoroscopic images were obtained intraoperatively and submitted for post operative interpretation. 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 Prior posterior fusion of L4-S1. Please see the performing provider's procedural report for further detail. IMPRESSION: Fluoroscopic images demonstrate extension of posterior fusion to L3-L4. Electronically Signed   By: Allegra Lai M.D.   On: 03/26/2022 11:34    DG C-Arm 1-60 Min-No Report  Result Date: 03/26/2022 Fluoroscopy was utilized by the requesting physician.  No radiographic interpretation.   DG C-Arm 1-60 Min-No Report  Result Date: 03/26/2022 Fluoroscopy was utilized by the requesting physician.  No radiographic interpretation.   DG C-Arm 1-60 Min-No Report  Result Date: 03/26/2022 Fluoroscopy was utilized by the requesting physician.  No radiographic interpretation.   DG C-Arm 1-60 Min-No Report  Result Date: 03/26/2022 Fluoroscopy was utilized by the requesting physician.  No radiographic interpretation.  Assessment & Plan:   Problem List Items Addressed This Visit     Erectile dysfunction - Primary    Not better Brode wants to try Cialis and Testosterone. Potential benefits of a long term sex steroid  use as well as potential risks (MI, elevation of PSA)  and complications were explained to the patient and were aknowledged.          Weight loss    Wt Readings from Last 3 Encounters:  01/21/23 134 lb (60.8 kg)  12/02/22 133 lb (60.3 kg)  03/26/22 135 lb (61.2 kg)        Elevated PSA    Monitor PSA      Hypogonadism in male     Traiton wants to try  Testosterone. Potential benefits of a long term sex steroid  use as well as potential risks (MI, elevation of PSA)  and complications were explained to the patient and were aknowledged. Monitor PSA, LFTs CBC         Meds ordered this encounter  Medications   DISCONTD: HYDROcodone-acetaminophen (NORCO) 10-325 MG tablet    Sig: Take 1 tablet by mouth every 6 (six) hours as needed. Take by mouth.    Dispense:  60 tablet    Refill:  0   tadalafil (CIALIS) 20 MG tablet    Sig: Take 1 tablet (20 mg total) by mouth daily as needed for erectile dysfunction.    Dispense:  10 tablet    Refill:  3   DISCONTD: testosterone cypionate (DEPOTESTOSTERONE CYPIONATE) 200 MG/ML injection    Sig: Inject 1 mL (200 mg total) into the muscle every 14 (fourteen) days.     Dispense:  10 mL    Refill:  1   DISCONTD: SYRINGE-NEEDLE, DISP, 3 ML (BD ECLIPSE SYRINGE/NEEDLE) 23G X 1" 3 ML MISC    Sig: As directed    Dispense:  50 each    Refill:  0   HYDROcodone-acetaminophen (NORCO) 10-325 MG tablet    Sig: Take 1 tablet by mouth every 6 (six) hours as needed. Take by mouth.    Dispense:  60 tablet    Refill:  0   SYRINGE-NEEDLE, DISP, 3 ML (BD ECLIPSE SYRINGE/NEEDLE) 23G X 1" 3 ML MISC    Sig: As directed    Dispense:  50 each    Refill:  0   testosterone cypionate (DEPOTESTOSTERONE CYPIONATE) 200 MG/ML injection    Sig: Inject 1 mL (200 mg total) into the muscle every 14 (fourteen) days.    Dispense:  10 mL    Refill:  1      Follow-up: Return in about 3 months (around 04/23/2023) for a follow-up visit.  Sonda Primes, MD

## 2023-01-21 NOTE — Assessment & Plan Note (Signed)
Wt Readings from Last 3 Encounters:  01/21/23 134 lb (60.8 kg)  12/02/22 133 lb (60.3 kg)  03/26/22 135 lb (61.2 kg)

## 2023-01-21 NOTE — Assessment & Plan Note (Signed)
  Denney wants to try  Testosterone. Potential benefits of a long term sex steroid  use as well as potential risks (MI, elevation of PSA)  and complications were explained to the patient and were aknowledged. Monitor PSA, LFTs CBC

## 2023-01-21 NOTE — Assessment & Plan Note (Signed)
Not better Shakeer wants to try Cialis and Testosterone. Potential benefits of a long term sex steroid  use as well as potential risks (MI, elevation of PSA)  and complications were explained to the patient and were aknowledged.

## 2023-01-21 NOTE — Telephone Encounter (Signed)
[facility] states they are out of stock of the HYDROcodone-acetaminophen (NORCO) 10-325 MG tablet.  Please check with pt and send RX to another pharmacy.   Channing Mutters: [phone]

## 2023-01-23 ENCOUNTER — Telehealth: Payer: Self-pay | Admitting: Internal Medicine

## 2023-01-23 NOTE — Telephone Encounter (Signed)
Yes MD sent on 5/21../l,mb

## 2023-01-23 NOTE — Telephone Encounter (Signed)
Too soon I think just done may 21

## 2023-01-23 NOTE — Telephone Encounter (Signed)
MD is out of the office until 6/4th. Please advise.Marland KitchenRaechel Chute

## 2023-01-23 NOTE — Telephone Encounter (Signed)
Prescription Request  01/23/2023  LOV: 01/21/2023  What is the name of the medication or equipment? hydrocodone  Have you contacted your pharmacy to request a refill? Yes   Which pharmacy would you like this sent to?  [id number] [place], [place] - [address] [place] [address] Phone: [phone] Fax: [phone]   Patient notified that their request is being sent to the clinical staff for review and that they should receive a response within 2 business days.   Please advise at Mobile [phone] (mobile)

## 2023-03-24 ENCOUNTER — Telehealth: Payer: Self-pay | Admitting: Internal Medicine

## 2023-03-24 NOTE — Telephone Encounter (Signed)
Prescription Request  03/24/2023  LOV: 01/21/2023  What is the name of the medication or equipment? HYDROcodone-acetaminophen (NORCO) 10-325 MG tablet   Have you contacted your pharmacy to request a refill? No   Which pharmacy would you like this sent to?  [id number] [place], [place] - [address] [place] [address] Phone: [phone] Fax: [phone]    Patient notified that their request is being sent to the clinical staff for review and that they should receive a response within 2 business days.   Please advise at [facility] [phone]

## 2023-03-27 MED ORDER — HYDROCODONE-ACETAMINOPHEN 10-325 MG PO TABS: 1.0000 | ORAL_TABLET | Freq: Four times a day (QID) | ORAL | 0 refills | Status: DC | PRN

## 2023-03-27 NOTE — Telephone Encounter (Signed)
Called pt no answer LMOM MD sent refill will need appt for next refill.Brent KitchenRaechel Chute

## 2023-03-27 NOTE — Telephone Encounter (Signed)
Okay.  Office visit every 3 months.  Thanks 

## 2023-04-07 ENCOUNTER — Other Ambulatory Visit: Payer: Self-pay | Admitting: Internal Medicine

## 2023-05-01 ENCOUNTER — Encounter: Payer: Self-pay | Admitting: Internal Medicine

## 2023-05-01 ENCOUNTER — Ambulatory Visit (INDEPENDENT_AMBULATORY_CARE_PROVIDER_SITE_OTHER): Payer: Medicare Other | Admitting: Internal Medicine

## 2023-05-01 VITALS — BP 110/80 | HR 60 | Temp 98.3°F | Ht 72.0 in | Wt 131.0 lb

## 2023-05-01 DIAGNOSIS — I10 Essential (primary) hypertension: Secondary | ICD-10-CM

## 2023-05-01 DIAGNOSIS — R972 Elevated prostate specific antigen [PSA]: Secondary | ICD-10-CM | POA: Diagnosis not present

## 2023-05-01 DIAGNOSIS — R202 Paresthesia of skin: Secondary | ICD-10-CM

## 2023-05-01 DIAGNOSIS — E785 Hyperlipidemia, unspecified: Secondary | ICD-10-CM

## 2023-05-01 DIAGNOSIS — N320 Bladder-neck obstruction: Secondary | ICD-10-CM | POA: Diagnosis not present

## 2023-05-01 DIAGNOSIS — N529 Male erectile dysfunction, unspecified: Secondary | ICD-10-CM

## 2023-05-01 DIAGNOSIS — E291 Testicular hypofunction: Secondary | ICD-10-CM

## 2023-05-01 MED ORDER — METHOCARBAMOL 500 MG PO TABS: 750.0000 mg | ORAL_TABLET | Freq: Four times a day (QID) | ORAL | 3 refills | Status: DC | PRN

## 2023-05-01 MED ORDER — TESTOSTERONE CYPIONATE 200 MG/ML IM SOLN: 200.0000 mg | INTRAMUSCULAR | 1 refills | Status: DC

## 2023-05-01 MED ORDER — "BD ECLIPSE SYRINGE/NEEDLE 23G X 1"" 3 ML MISC": 0 refills | Status: AC

## 2023-05-01 MED ORDER — TADALAFIL 20 MG PO TABS: 20.0000 mg | ORAL_TABLET | Freq: Every day | ORAL | 3 refills | Status: DC | PRN

## 2023-05-01 MED ORDER — HYDROCODONE-ACETAMINOPHEN 10-325 MG PO TABS: 1.0000 | ORAL_TABLET | Freq: Four times a day (QID) | ORAL | 0 refills | Status: DC | PRN

## 2023-05-01 NOTE — Progress Notes (Signed)
Subjective:  Patient ID: Brent Turner, male    DOB: [birth year]  Age: 82 y.o. MRN: [id number]  CC: Follow-up (3 MNTH F/U)   HPI Brent Turner presents for fatigue, ED, hypertension.  Brent Turner continues to work.  He has a younger girlfriend  Outpatient Medications Prior to Visit  Medication Sig Dispense Refill   allopurinol (ZYLOPRIM) 300 MG tablet TAKE 1 TABLET BY MOUTH EVERY DAY 90 tablet 2   amLODipine (NORVASC) 5 MG tablet Take 1 tablet (5 mg total) by mouth daily. Annual appt due must see provider for future refills. 30 tablet 0   Cholecalciferol 1000 UNITS tablet Take 1,000 Units by mouth daily.     tamsulosin (FLOMAX) 0.4 MG CAPS capsule TAKE 1 CAPSULE BY MOUTH DAILY *NEED APPOINTMENT WITH LABS FOR REFILLS* 30 capsule 0   HYDROcodone-acetaminophen (NORCO) 10-325 MG tablet Take 1 tablet by mouth every 6 (six) hours as needed. Take by mouth. 60 tablet 0   methocarbamol (ROBAXIN) 500 MG tablet Take 1.5 tablets (750 mg total) by mouth every 6 (six) hours as needed for muscle spasms. 90 tablet 3   SYRINGE-NEEDLE, DISP, 3 ML (BD ECLIPSE SYRINGE/NEEDLE) 23G X 1" 3 ML MISC As directed 50 each 0   testosterone cypionate (DEPOTESTOSTERONE CYPIONATE) 200 MG/ML injection Inject 1 mL (200 mg total) into the muscle every 14 (fourteen) days. 10 mL 1   albuterol (VENTOLIN HFA) 108 (90 Base) MCG/ACT inhaler Inhale 2 puffs into the lungs every 6 (six) hours as needed for wheezing or shortness of breath. 18 g 3   tadalafil (CIALIS) 20 MG tablet Take 1 tablet (20 mg total) by mouth daily as needed for erectile dysfunction. 10 tablet 3   No facility-administered medications prior to visit.    ROS: Review of Systems  Constitutional:  Positive for fatigue. Negative for appetite change and unexpected weight change.  HENT:  Negative for congestion, nosebleeds, sneezing, sore throat and trouble swallowing.   Eyes:  Negative for itching and visual disturbance.  Respiratory:  Negative for cough.    Cardiovascular:  Negative for chest pain, palpitations and leg swelling.  Gastrointestinal:  Negative for abdominal distention, blood in stool, diarrhea and nausea.  Genitourinary:  Negative for frequency and hematuria.  Musculoskeletal:  Positive for arthralgias and back pain. Negative for gait problem, joint swelling and neck pain.  Skin:  Negative for rash.  Neurological:  Negative for dizziness, tremors, speech difficulty and weakness.  Psychiatric/Behavioral:  Negative for agitation, dysphoric mood and sleep disturbance. The patient is not nervous/anxious.     Objective:  BP 110/80 (BP Location: Left Arm, Patient Position: Sitting, Cuff Size: Normal)   Pulse 60   Temp 98.3 F (36.8 C) (Oral)   Ht 6' (1.829 m)   Wt 131 lb (59.4 kg)   SpO2 96%   BMI 17.77 kg/m   BP Readings from Last 3 Encounters:  05/01/23 110/80  01/21/23 118/78  12/02/22 134/78    Wt Readings from Last 3 Encounters:  05/01/23 131 lb (59.4 kg)  01/21/23 134 lb (60.8 kg)  12/02/22 133 lb (60.3 kg)    Physical Exam Constitutional:      General: He is not in acute distress.    Appearance: He is well-developed.     Comments: NAD  Eyes:     Conjunctiva/sclera: Conjunctivae normal.     Pupils: Pupils are equal, round, and reactive to light.  Neck:     Thyroid: No thyromegaly.     Vascular: No JVD.  Cardiovascular:     Rate and Rhythm: Normal rate and regular rhythm.     Heart sounds: Normal heart sounds. No murmur heard.    No friction rub. No gallop.  Pulmonary:     Effort: Pulmonary effort is normal. No respiratory distress.     Breath sounds: Normal breath sounds. No wheezing or rales.  Chest:     Chest wall: No tenderness.  Abdominal:     General: Bowel sounds are normal. There is no distension.     Palpations: Abdomen is soft. There is no mass.     Tenderness: There is no abdominal tenderness. There is no guarding or rebound.  Musculoskeletal:        General: No tenderness. Normal range  of motion.     Cervical back: Normal range of motion.  Lymphadenopathy:     Cervical: No cervical adenopathy.  Skin:    General: Skin is warm and dry.     Findings: No rash.  Neurological:     Mental Status: He is alert and oriented to person, place, and time.     Cranial Nerves: No cranial nerve deficit.     Motor: No abnormal muscle tone.     Coordination: Coordination normal.     Gait: Gait normal.     Deep Tendon Reflexes: Reflexes are normal and symmetric.  Psychiatric:        Behavior: Behavior normal.        Thought Content: Thought content normal.        Judgment: Judgment normal.     Lab Results  Component Value Date   WBC 11.2 (H) 12/02/2022   HGB 14.3 12/02/2022   HCT 42.0 12/02/2022   PLT 334.0 12/02/2022   GLUCOSE 101 (H) 12/02/2022   CHOL 183 12/02/2022   TRIG 125.0 12/02/2022   HDL 47.60 12/02/2022   LDLDIRECT 106.0 02/14/2020   LDLCALC 111 (H) 12/02/2022   ALT 18 12/02/2022   AST 23 12/02/2022   NA 134 (L) 12/02/2022   K 4.4 12/02/2022   CL 103 12/02/2022   CREATININE 1.09 12/02/2022   BUN 19 12/02/2022   CO2 25 12/02/2022   TSH 3.01 12/02/2022   PSA 5.11 (H) 01/08/2022   HGBA1C 7.0 (H) 07/09/2017    DG Lumbar Spine 2-3 Views  Result Date: 03/26/2022 CLINICAL DATA:  Extension to L3-L4 PLIF EXAM: LUMBAR SPINE - 2 VIEW COMPARISON:  Lumbar spine radiograph dated February 05, 2021 FINDINGS: Fluoroscopic images were obtained intraoperatively and submitted for post operative interpretation. 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 Prior posterior fusion of L4-S1. Please see the performing provider's procedural report for further detail. IMPRESSION: Fluoroscopic images demonstrate extension of posterior fusion to L3-L4. Electronically Signed   By: Allegra Lai M.D.   On: 03/26/2022 11:34   DG C-Arm 1-60 Min-No Report  Result Date:  03/26/2022 Fluoroscopy was utilized by the requesting physician.  No radiographic interpretation.   DG C-Arm 1-60 Min-No Report  Result Date: 03/26/2022 Fluoroscopy was utilized by the requesting physician.  No radiographic interpretation.   DG C-Arm 1-60 Min-No Report  Result Date: 03/26/2022 Fluoroscopy was utilized by the requesting physician.  No radiographic interpretation.   DG C-Arm 1-60 Min-No Report  Result Date: 03/26/2022 Fluoroscopy was utilized by the requesting physician.  No radiographic interpretation.    Assessment & Plan:   Problem List Items Addressed This Visit  Essential hypertension    Chronic Cont on Amlodipine      Relevant Medications   tadalafil (CIALIS) 20 MG tablet   Other Relevant Orders   CBC with Differential/Platelet   Comprehensive metabolic panel   T4, free   Erectile dysfunction    Not better Ulrik wants to try Cialis and Testosterone. Potential benefits of a long term sex steroid  use as well as potential risks (MI, elevation of PSA)  and complications were explained to the patient and were aknowledged.          Bladder neck obstruction - Primary    Continue on Flomax      Relevant Orders   CBC with Differential/Platelet   PSA   Dyslipidemia     Diet, meds discussed      Relevant Orders   TSH   Elevated PSA    We will continue to monitor PSA      Hypogonadism in male     Dakeem wants to try  Testosterone. Potential benefits of a long term sex steroid  use as well as potential risks (MI, elevation of PSA)  and complications were explained to the patient and were aknowledged. Monitor PSA, LFTs CBC      Other Visit Diagnoses     Paresthesia       Relevant Orders   CBC with Differential/Platelet   T4, free   Vitamin B12         Meds ordered this encounter  Medications   HYDROcodone-acetaminophen (NORCO) 10-325 MG tablet    Sig: Take 1 tablet by mouth every 6 (six) hours as needed. Take by mouth.     Dispense:  60 tablet    Refill:  0   methocarbamol (ROBAXIN) 500 MG tablet    Sig: Take 1.5 tablets (750 mg total) by mouth every 6 (six) hours as needed for muscle spasms.    Dispense:  90 tablet    Refill:  3   SYRINGE-NEEDLE, DISP, 3 ML (BD ECLIPSE SYRINGE/NEEDLE) 23G X 1" 3 ML MISC    Sig: As directed    Dispense:  50 each    Refill:  0   testosterone cypionate (DEPOTESTOSTERONE CYPIONATE) 200 MG/ML injection    Sig: Inject 1 mL (200 mg total) into the muscle every 14 (fourteen) days.    Dispense:  10 mL    Refill:  1   tadalafil (CIALIS) 20 MG tablet    Sig: Take 1 tablet (20 mg total) by mouth daily as needed for erectile dysfunction.    Dispense:  10 tablet    Refill:  3      Follow-up: Return in about 3 months (around 08/01/2023) for a follow-up visit.  Sonda Primes, MD

## 2023-05-08 NOTE — Assessment & Plan Note (Signed)
We will continue to monitor PSA

## 2023-05-08 NOTE — Assessment & Plan Note (Signed)
Continue on Flomax.

## 2023-05-08 NOTE — Assessment & Plan Note (Signed)
Not better Shakeer wants to try Cialis and Testosterone. Potential benefits of a long term sex steroid  use as well as potential risks (MI, elevation of PSA)  and complications were explained to the patient and were aknowledged.

## 2023-05-08 NOTE — Assessment & Plan Note (Signed)
  Brent Turner wants to try  Testosterone. Potential benefits of a long term sex steroid  use as well as potential risks (MI, elevation of PSA)  and complications were explained to the patient and were aknowledged. Monitor PSA, LFTs CBC

## 2023-05-08 NOTE — Assessment & Plan Note (Signed)
Diet, meds discussed

## 2023-05-08 NOTE — Assessment & Plan Note (Signed)
Chronic Cont on Amlodipine

## 2023-05-22 ENCOUNTER — Other Ambulatory Visit: Payer: Self-pay | Admitting: Internal Medicine

## 2023-06-02 ENCOUNTER — Ambulatory Visit (INDEPENDENT_AMBULATORY_CARE_PROVIDER_SITE_OTHER): Payer: Medicare Other

## 2023-06-02 ENCOUNTER — Telehealth: Payer: Self-pay | Admitting: Internal Medicine

## 2023-06-02 VITALS — Ht 72.0 in | Wt 135.0 lb

## 2023-06-02 DIAGNOSIS — Z0000 Encounter for general adult medical examination without abnormal findings: Secondary | ICD-10-CM

## 2023-06-02 NOTE — Telephone Encounter (Signed)
Caller & Relationship to patient: Self  Call back number: [phone]   Date of last office visit: 8.29.24  Date of next office visit: 12.2.24  Medication(s) to be refilled:  HYDROcodone-acetaminophen [facility]) 10-325 MG tablet   [phone]   Phone: [phone]  Fax: [phone]

## 2023-06-02 NOTE — Progress Notes (Addendum)
Subjective:   Brent Turner is a 82 y.o. male who presents for Medicare Annual/Subsequent preventive examination.  Visit Complete: Virtual  I connected with  Jlynn Langille Loyd on 06/02/23 by a audio enabled telemedicine application and verified that I am speaking with the correct person using two identifiers.  Patient Location: Home  Provider Location: Office/Clinic  I discussed the limitations of evaluation and management by telemedicine. The patient expressed understanding and agreed to proceed.  Because this visit was a virtual/telehealth visit, some criteria may be missing or patient reported. Any vitals not documented were not able to be obtained and vitals that have been documented are patient reported.   Cardiac Risk Factors include: advanced age (>49men, >3 women);dyslipidemia;family history of premature cardiovascular disease;hypertension;male gender;smoking/ tobacco exposure     Objective:    Today's Vitals   06/02/23 1438 06/02/23 1439  Weight: 135 lb (61.2 kg)   Height: 6' (1.829 m)   PainSc: 2  2   PainLoc: Back    Body mass index is 18.31 kg/m.     06/02/2023    2:41 PM 05/28/2022   10:13 AM 03/20/2022    2:29 PM 04/21/2020    7:08 AM 04/19/2020    1:11 PM 02/17/2020    3:15 PM 05/01/2016    1:40 PM  Advanced Directives  Does Patient Have a Medical Advance Directive? Yes Yes Yes Yes Yes Yes Yes  Type of Estate agent of [place];Living will Healthcare Power of [place];Living will Living will Living will Living will Living will;Healthcare Power of Attorney   Does patient want to make changes to medical advance directive?   No - Patient declined No - Patient declined No - Patient declined No - Patient declined   Copy of Healthcare Power of Attorney in Chart? No - copy requested No - copy requested    No - copy requested Yes    Current Medications (verified) Outpatient Encounter Medications as of 06/02/2023  Medication Sig   allopurinol  (ZYLOPRIM) 300 MG tablet TAKE 1 TABLET BY MOUTH EVERY DAY   amLODipine (NORVASC) 5 MG tablet TAKE 1 TABLET (5 MG TOTAL) BY MOUTH DAILY.   Cholecalciferol 1000 UNITS tablet Take 1,000 Units by mouth daily.   HYDROcodone-acetaminophen (NORCO) 10-325 MG tablet Take 1 tablet by mouth every 6 (six) hours as needed. Take by mouth.   methocarbamol (ROBAXIN) 500 MG tablet Take 1.5 tablets (750 mg total) by mouth every 6 (six) hours as needed for muscle spasms.   SYRINGE-NEEDLE, DISP, 3 ML (BD ECLIPSE SYRINGE/NEEDLE) 23G X 1" 3 ML MISC As directed   tadalafil (CIALIS) 20 MG tablet Take 1 tablet (20 mg total) by mouth daily as needed for erectile dysfunction.   tamsulosin (FLOMAX) 0.4 MG CAPS capsule TAKE 1 CAPSULE BY MOUTH DAILY *NEED APPOINTMENT WITH LABS FOR REFILLS*   testosterone cypionate (DEPOTESTOSTERONE CYPIONATE) 200 MG/ML injection Inject 1 mL (200 mg total) into the muscle every 14 (fourteen) days.   No facility-administered encounter medications on file as of 06/02/2023.    Allergies (verified) Tetanus toxoid, adsorbed and Tetanus toxoids   History: Past Medical History:  Diagnosis Date   ED (erectile dysfunction)    Gout    HTN (hypertension)    Vitamin D deficiency    Past Surgical History:  Procedure Laterality Date   COLONOSCOPY     EYE SURGERY Bilateral    cataracts removed   LUMBAR DISC SURGERY Left    Family History  Problem Relation Age of  Onset   Stroke Mother 9   Cancer Father 57       lung   Social History   Socioeconomic History   Marital status: Widowed    Spouse name: Not on file   Number of children: 5   Years of education: 2   Highest education level: High school graduate  Occupational History   Occupation: hotel maintenance part time  Tobacco Use   Smoking status: Every Day    Current packs/day: 1.00    Average packs/day: 1 pack/day for 50.0 years (50.0 ttl pk-yrs)    Types: Cigarettes   Smokeless tobacco: Never   Tobacco comments:    Nurse  discussed 1-800-quit-now  Vaping Use   Vaping status: Never Used  Substance and Sexual Activity   Alcohol use: Yes    Alcohol/week: 7.0 standard drinks of alcohol    Types: 7 Shots of liquor per week    Comment: occasional wine   Drug use: No   Sexual activity: Not Currently  Other Topics Concern   Not on file  Social History Narrative   Not on file   Social Determinants of Health   Financial Resource Strain: Low Risk  (06/02/2023)   Overall Financial Resource Strain (CARDIA)    Difficulty of Paying Living Expenses: Not hard at all  Food Insecurity: No Food Insecurity (06/02/2023)   Hunger Vital Sign    Worried About Running Out of Food in the Last Year: Never true    Ran Out of Food in the Last Year: Never true  Transportation Needs: No Transportation Needs (06/02/2023)   PRAPARE - Administrator, Civil Service (Medical): No    Lack of Transportation (Non-Medical): No  Physical Activity: Sufficiently Active (06/02/2023)   Exercise Vital Sign    Days of Exercise per Week: 5 days    Minutes of Exercise per Session: 30 min  Stress: No Stress Concern Present (06/02/2023)   [employer] of Occupational Health - Occupational Stress Questionnaire    Feeling of Stress : Not at all  Social Connections: Moderately Integrated (06/02/2023)   Social Connection and Isolation Panel [NHANES]    Frequency of Communication with Friends and Family: Twice a week    Frequency of Social Gatherings with Friends and Family: Twice a week    Attends Religious Services: 1 to 4 times per year    Active Member of [employer] or Organizations: No    Attends Banker Meetings: 1 to 4 times per year    Marital Status: Widowed    Tobacco Counseling Ready to quit: Not Answered Counseling given: Not Answered Tobacco comments: Nurse discussed 1-800-quit-now   Clinical Intake:  Pre-visit preparation completed: Yes  Pain : 0-10 Pain Score: 2  Pain Type: Neuropathic pain Pain  Location: Back     BMI - recorded: 18.31 Nutritional Risks: None Diabetes: No  How often do you need to have someone help you when you read instructions, pamphlets, or other written materials from your doctor or pharmacy?: 1 - Never What is the last grade level you completed in school?: HSG  Interpreter Needed?: No  Information entered by :: Yarieliz Wasser N. Tabius Rood, LPN.   Activities of Daily Living    06/02/2023    2:45 PM  In your present state of health, do you have any difficulty performing the following activities:  Hearing? 0  Vision? 0  Difficulty concentrating or making decisions? 0  Walking or climbing stairs? 0  Dressing or bathing? 0  Doing errands, shopping? 0  Preparing Food and eating ? N  Using the Toilet? N  In the past six months, have you accidently leaked urine? N  Do you have problems with loss of bowel control? N  Managing your Medications? N  Managing your Finances? N  Housekeeping or managing your Housekeeping? N    Patient Care Team: [name], Georgina Quint, MD as PCP - General Radionchenko, Lucious Groves, MD as Referring Physician (Ophthalmology) Hilarie Fredrickson, MD as Consulting Physician (Gastroenterology) Dawley, Alan Mulder, DO as Consulting Physician  Indicate any recent Medical Services you may have received from other than [facility] providers in the past year (date may be approximate).     Assessment:   This is a routine wellness examination for [place].  Hearing/Vision screen Hearing Screening - Comments:: Patient denied any hearing difficulty.   No hearing aids.   Vision Screening - Comments:: Patient does wear otc readers. Cataracts removed. Eye exam done by: Orma Render, MD.    Goals Addressed   None   Depression Screen    06/02/2023    2:42 PM 05/01/2023    4:12 PM 01/21/2023    1:52 PM 12/02/2022    7:57 AM 05/28/2022   10:25 AM 03/19/2022    3:46 PM 01/08/2022    3:33 PM  PHQ 2/9 Scores  PHQ - 2 Score 0 0 0 4 0 0 1  PHQ- 9 Score 0  0 11    8    Fall Risk    06/02/2023    2:42 PM 05/01/2023    4:12 PM 01/21/2023    1:52 PM 12/02/2022    7:56 AM 05/28/2022   10:14 AM  Fall Risk   Falls in the past year? 0 0 0 0 0  Number falls in past yr: 0 0 0 0 0  Injury with Fall? 0 0 0 0 0  Risk for fall due to : No Fall Risks No Fall Risks No Fall Risks No Fall Risks History of fall(s);Impaired balance/gait;Impaired mobility;Impaired vision  Follow up Falls prevention discussed Falls evaluation completed Falls evaluation completed Falls evaluation completed Falls evaluation completed    MEDICARE RISK AT HOME: Medicare Risk at Home Any stairs in or around the home?: Yes If so, are there any without handrails?: No Home free of loose throw rugs in walkways, pet beds, electrical cords, etc?: Yes Adequate lighting in your home to reduce risk of falls?: Yes Life alert?: No Use of a cane, walker or w/c?: No Grab bars in the bathroom?: No Shower chair or bench in shower?: No Elevated toilet seat or a handicapped toilet?: No  TIMED UP AND GO:  Was the test performed?  No    Cognitive Function:    05/01/2016    2:14 PM  MMSE - Mini Mental State Exam  Not completed: --        06/02/2023    2:52 PM 05/28/2022   10:15 AM  6CIT Screen  What Year? 0 points 0 points  What month? 0 points   What time? 0 points 0 points  Count back from 20 0 points 0 points  Months in reverse 0 points 2 points  Repeat phrase 0 points 0 points  Total Score 0 points     Immunizations Immunization History  Administered Date(s) Administered   Fluad Quad(high Dose 65+) 08/19/2019   Influenza Split 07/13/2012   Influenza Whole 07/31/2010, 05/14/2011   Influenza, High Dose Seasonal PF 07/16/2013, 05/01/2016, 07/09/2017   Influenza,inj,Quad  PF,6+ Mos 07/26/2014   Moderna Covid-19 Vaccine Bivalent Booster 23yrs & up 08/22/2020   Moderna Sars-Covid-2 Vaccination 02/02/2020, 03/09/2020   Pneumococcal Conjugate-13 12/28/2020   Pneumococcal  Polysaccharide-23 01/28/2007    TDAP status: Due, Education has been provided regarding the importance of this vaccine. Advised may receive this vaccine at local pharmacy or Health Dept. Aware to provide a copy of the vaccination record if obtained from local pharmacy or Health Dept. Verbalized acceptance and understanding.  Flu Vaccine status: Due, Education has been provided regarding the importance of this vaccine. Advised may receive this vaccine at local pharmacy or Health Dept. Aware to provide a copy of the vaccination record if obtained from local pharmacy or Health Dept. Verbalized acceptance and understanding.  Pneumococcal vaccine status: Up to date  Covid-19 vaccine status: Completed vaccines  Qualifies for Shingles Vaccine? Yes   Zostavax completed No   Shingrix Completed?: No.    Education has been provided regarding the importance of this vaccine. Patient has been advised to call insurance company to determine out of pocket expense if they have not yet received this vaccine. Advised may also receive vaccine at local pharmacy or Health Dept. Verbalized acceptance and understanding.  Screening Tests Health Maintenance  Topic Date Due   DTaP/Tdap/Td (1 - Tdap) Never done   INFLUENZA VACCINE  04/03/2023   COVID-19 Vaccine (4 - 2023-24 season) 05/04/2023   Medicare Annual Wellness (AWV)  06/01/2024   Pneumonia Vaccine 22+ Years old  Completed   HPV VACCINES  Aged Out   Zoster Vaccines- Shingrix  Discontinued    Health Maintenance  Health Maintenance Due  Topic Date Due   DTaP/Tdap/Td (1 - Tdap) Never done   INFLUENZA VACCINE  04/03/2023   COVID-19 Vaccine (4 - 2023-24 season) 05/04/2023    Colorectal cancer screening: No longer required.   Lung Cancer Screening: (Low Dose CT Chest recommended if Age 61-80 years, 20 pack-year currently smoking OR have quit w/in 15years.) does not qualify.   Lung Cancer Screening Referral: no  Additional Screening:  Hepatitis C  Screening: does not qualify; Completed no  Vision Screening: Recommended annual ophthalmology exams for early detection of glaucoma and other disorders of the eye. Is the patient up to date with their annual eye exam?  Yes  Who is the provider or what is the name of the office in which the patient attends annual eye exams? Orma Render, MD. If pt is not established with a provider, would they like to be referred to a provider to establish care? No .   Dental Screening: Recommended annual dental exams for proper oral hygiene  Diabetic Foot Exam: N/A  Community Resource Referral / Chronic Care Management: CRR required this visit?  No   CCM required this visit?  No     Plan:     I have personally reviewed and noted the following in the patient's chart:   Medical and social history Use of alcohol, tobacco or illicit drugs  Current medications and supplements including opioid prescriptions. Patient is currently taking opioid prescriptions. Information provided to patient regarding non-opioid alternatives. Patient advised to discuss non-opioid treatment plan with their provider. Functional ability and status Nutritional status Physical activity Advanced directives List of other physicians Hospitalizations, surgeries, and ER visits in previous 12 months Vitals Screenings to include cognitive, depression, and falls Referrals and appointments  In addition, I have reviewed and discussed with patient certain preventive protocols, quality metrics, and best practice recommendations. A written personalized care plan for  preventive services as well as general preventive health recommendations were provided to patient.     Mickeal Needy, LPN   6/44/0347   After Visit Summary: (Mail) Due to this being a telephonic visit, the after visit summary with patients personalized plan was offered to patient via mail   Nurse Notes: Normal cognitive status assessed by direct observation via  telephone conversation by this Nurse Health Advisor. No abnormalities found.  Medical screening examination/treatment/procedure(s) were performed by non-physician practitioner and as supervising physician I was immediately available for consultation/collaboration.  I agree with above. Jacinta Shoe, MD

## 2023-06-02 NOTE — Patient Instructions (Signed)
Brent Turner , Thank you for taking time to come for your Medicare Wellness Visit. I appreciate your ongoing commitment to your health goals. Please review the following plan we discussed and let me know if I can assist you in the future.   Referrals/Orders/Follow-Ups/Clinician Recommendations: No  This is a list of the screening recommended for you and due dates:  Health Maintenance  Topic Date Due   DTaP/Tdap/Td vaccine (1 - Tdap) Never done   Flu Shot  04/03/2023   COVID-19 Vaccine (4 - 2023-24 season) 05/04/2023   Medicare Annual Wellness Visit  06/01/2024   Pneumonia Vaccine  Completed   HPV Vaccine  Aged Out   Zoster (Shingles) Vaccine  Discontinued    Advanced directives: (Declined) Advance directive discussed with you today. Even though you declined this today, please call our office should you change your mind, and we can give you the proper paperwork for you to fill out.  Next Medicare Annual Wellness Visit scheduled for next year: Yes

## 2023-06-03 MED ORDER — HYDROCODONE-ACETAMINOPHEN 10-325 MG PO TABS: 1.0000 | ORAL_TABLET | Freq: Four times a day (QID) | ORAL | 0 refills | Status: DC | PRN

## 2023-06-03 NOTE — Telephone Encounter (Signed)
Okay.  Thanks.

## 2023-06-22 ENCOUNTER — Other Ambulatory Visit: Payer: Self-pay | Admitting: Internal Medicine

## 2023-06-30 ENCOUNTER — Telehealth: Payer: Self-pay | Admitting: Internal Medicine

## 2023-06-30 NOTE — Telephone Encounter (Signed)
Prescription Request  06/30/2023  LOV: 05/01/2023  What is the name of the medication or equipment? Tadalifil   - patient would like a call when this has been sent in  Have you contacted your pharmacy to request a refill? Yes   Which pharmacy would you like this sent to?  [facility] [address] [place] - [address] [place] [address] Phone: [phone] Fax: [phone]    Patient notified that their request is being sent to the clinical staff for review and that they should receive a response within 2 business days.   Please advise at Mobile [phone] (mobile)

## 2023-07-01 ENCOUNTER — Other Ambulatory Visit: Payer: Self-pay

## 2023-07-01 MED ORDER — TADALAFIL 20 MG PO TABS: 20.0000 mg | ORAL_TABLET | Freq: Every day | ORAL | 3 refills | Status: DC | PRN

## 2023-07-02 NOTE — Telephone Encounter (Signed)
Spoke with the pt and was able to inform him that his rx has been sent in.

## 2023-08-04 ENCOUNTER — Ambulatory Visit (INDEPENDENT_AMBULATORY_CARE_PROVIDER_SITE_OTHER): Payer: Medicare Other | Admitting: Internal Medicine

## 2023-08-04 ENCOUNTER — Encounter: Payer: Self-pay | Admitting: Internal Medicine

## 2023-08-04 VITALS — BP 110/70 | HR 100 | Temp 98.3°F | Ht 72.0 in | Wt 137.0 lb

## 2023-08-04 DIAGNOSIS — R634 Abnormal weight loss: Secondary | ICD-10-CM | POA: Diagnosis not present

## 2023-08-04 DIAGNOSIS — M5430 Sciatica, unspecified side: Secondary | ICD-10-CM | POA: Diagnosis not present

## 2023-08-04 DIAGNOSIS — N529 Male erectile dysfunction, unspecified: Secondary | ICD-10-CM

## 2023-08-04 DIAGNOSIS — E291 Testicular hypofunction: Secondary | ICD-10-CM | POA: Diagnosis not present

## 2023-08-04 DIAGNOSIS — I10 Essential (primary) hypertension: Secondary | ICD-10-CM | POA: Diagnosis not present

## 2023-08-04 MED ORDER — TADALAFIL 20 MG PO TABS: 20.0000 mg | ORAL_TABLET | ORAL | 5 refills | Status: DC | PRN

## 2023-08-04 MED ORDER — HYDROCODONE-ACETAMINOPHEN 10-325 MG PO TABS: 1.0000 | ORAL_TABLET | Freq: Four times a day (QID) | ORAL | 0 refills | Status: DC | PRN

## 2023-08-04 NOTE — Assessment & Plan Note (Signed)
  Brent Turner wants to try  Testosterone. Potential benefits of a long term sex steroid  use as well as potential risks (MI, elevation of PSA)  and complications were explained to the patient and were aknowledged. Monitor PSA, LFTs CBC

## 2023-08-04 NOTE — Progress Notes (Signed)
Subjective:  Patient ID: Brent BAMBRICK, male    DOB: 01-Mar-1941  Age: 82 y.o. MRN: [id number]  CC: Medical Management of Chronic Issues (3 mnth f/u)   HPI Brent Turner presents for LBP, ED  Outpatient Medications Prior to Visit  Medication Sig Dispense Refill   allopurinol (ZYLOPRIM) 300 MG tablet TAKE 1 TABLET BY MOUTH EVERY DAY 90 tablet 2   amLODipine (NORVASC) 5 MG tablet TAKE 1 TABLET (5 MG TOTAL) BY MOUTH DAILY. 90 tablet 3   Cholecalciferol 1000 UNITS tablet Take 1,000 Units by mouth daily.     methocarbamol (ROBAXIN) 500 MG tablet Take 1.5 tablets (750 mg total) by mouth every 6 (six) hours as needed for muscle spasms. 90 tablet 3   SYRINGE-NEEDLE, DISP, 3 ML (BD ECLIPSE SYRINGE/NEEDLE) 23G X 1" 3 ML MISC As directed 50 each 0   tamsulosin (FLOMAX) 0.4 MG CAPS capsule TAKE 1 CAPSULE BY MOUTH DAILY *NEED APPOINTMENT WITH LABS FOR REFILLS* 90 capsule 1   testosterone cypionate (DEPOTESTOSTERONE CYPIONATE) 200 MG/ML injection Inject 1 mL (200 mg total) into the muscle every 14 (fourteen) days. 10 mL 1   HYDROcodone-acetaminophen (NORCO) 10-325 MG tablet Take 1 tablet by mouth every 6 (six) hours as needed. Take by mouth. 60 tablet 0   tadalafil (CIALIS) 20 MG tablet Take 1 tablet (20 mg total) by mouth daily as needed for erectile dysfunction. 10 tablet 3   No facility-administered medications prior to visit.    ROS: Review of Systems  Constitutional:  Positive for activity change. Negative for appetite change, fatigue and unexpected weight change.  HENT:  Negative for congestion, nosebleeds, sneezing, sore throat and trouble swallowing.   Eyes:  Negative for itching and visual disturbance.  Respiratory:  Negative for cough.   Cardiovascular:  Negative for chest pain, palpitations and leg swelling.  Gastrointestinal:  Negative for abdominal distention, blood in stool, diarrhea and nausea.  Genitourinary:  Negative for frequency and hematuria.  Musculoskeletal:  Negative for  back pain, gait problem, joint swelling and neck pain.  Skin:  Negative for rash.  Neurological:  Negative for dizziness, tremors, speech difficulty and weakness.  Psychiatric/Behavioral:  Negative for agitation, dysphoric mood and sleep disturbance. The patient is not nervous/anxious.     Objective:  BP 110/70 (BP Location: Left Arm, Patient Position: Sitting, Cuff Size: Normal)   Pulse 100   Temp 98.3 F (36.8 C) (Oral)   Ht 6' (1.829 m)   Wt 137 lb (62.1 kg)   SpO2 94%   BMI 18.58 kg/m   BP Readings from Last 3 Encounters:  08/04/23 110/70  05/01/23 110/80  01/21/23 118/78    Wt Readings from Last 3 Encounters:  08/04/23 137 lb (62.1 kg)  06/02/23 135 lb (61.2 kg)  05/01/23 131 lb (59.4 kg)    Physical Exam Constitutional:      General: He is not in acute distress.    Appearance: Normal appearance. He is well-developed.     Comments: NAD  Eyes:     Conjunctiva/sclera: Conjunctivae normal.     Pupils: Pupils are equal, round, and reactive to light.  Neck:     Thyroid: No thyromegaly.     Vascular: No JVD.  Cardiovascular:     Rate and Rhythm: Normal rate and regular rhythm.     Heart sounds: Normal heart sounds. No murmur heard.    No friction rub. No gallop.  Pulmonary:     Effort: Pulmonary effort is normal. No respiratory distress.  Breath sounds: Normal breath sounds. No wheezing or rales.  Chest:     Chest wall: No tenderness.  Abdominal:     General: Bowel sounds are normal. There is no distension.     Palpations: Abdomen is soft. There is no mass.     Tenderness: There is no abdominal tenderness. There is no guarding or rebound.  Musculoskeletal:        General: No tenderness. Normal range of motion.     Cervical back: Normal range of motion.  Lymphadenopathy:     Cervical: No cervical adenopathy.  Skin:    General: Skin is warm and dry.     Findings: No rash.  Neurological:     Mental Status: He is alert and oriented to person, place, and  time.     Cranial Nerves: No cranial nerve deficit.     Motor: No abnormal muscle tone.     Coordination: Coordination normal.     Gait: Gait normal.     Deep Tendon Reflexes: Reflexes are normal and symmetric.  Psychiatric:        Behavior: Behavior normal.        Thought Content: Thought content normal.        Judgment: Judgment normal.   Thin  Lab Results  Component Value Date   WBC 11.2 (H) 12/02/2022   HGB 14.3 12/02/2022   HCT 42.0 12/02/2022   PLT 334.0 12/02/2022   GLUCOSE 101 (H) 12/02/2022   CHOL 183 12/02/2022   TRIG 125.0 12/02/2022   HDL 47.60 12/02/2022   LDLDIRECT 106.0 02/14/2020   LDLCALC 111 (H) 12/02/2022   ALT 18 12/02/2022   AST 23 12/02/2022   NA 134 (L) 12/02/2022   K 4.4 12/02/2022   CL 103 12/02/2022   CREATININE 1.09 12/02/2022   BUN 19 12/02/2022   CO2 25 12/02/2022   TSH 3.01 12/02/2022   PSA 5.11 (H) 01/08/2022   HGBA1C 7.0 (H) 07/09/2017    DG Lumbar Spine 2-3 Views  Result Date: 03/26/2022 CLINICAL DATA:  Extension to L3-L4 PLIF EXAM: LUMBAR SPINE - 2 VIEW COMPARISON:  Lumbar spine radiograph dated February 05, 2021 FINDINGS: Fluoroscopic images were obtained intraoperatively and submitted for post operative interpretation. 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 Prior posterior fusion of L4-S1. Please see the performing provider's procedural report for further detail. IMPRESSION: Fluoroscopic images demonstrate extension of posterior fusion to L3-L4. Electronically Signed   By: Allegra Lai M.D.   On: 03/26/2022 11:34   DG C-Arm 1-60 Min-No Report  Result Date: 03/26/2022 Fluoroscopy was utilized by the requesting physician.  No radiographic interpretation.   DG C-Arm 1-60 Min-No Report  Result Date: 03/26/2022 Fluoroscopy was utilized by the requesting physician.  No radiographic interpretation.   DG C-Arm 1-60 Min-No  Report  Result Date: 03/26/2022 Fluoroscopy was utilized by the requesting physician.  No radiographic interpretation.   DG C-Arm 1-60 Min-No Report  Result Date: 03/26/2022 Fluoroscopy was utilized by the requesting physician.  No radiographic interpretation.    Assessment & Plan:   Problem List Items Addressed This Visit     Essential hypertension - Primary    Chronic Cont on Amlodipine      Relevant Medications   tadalafil (CIALIS) 20 MG tablet   Erectile dysfunction    Trial of Cialis brand      Weight loss    Wt Readings from  Last 3 Encounters:  08/04/23 137 lb (62.1 kg)  06/02/23 135 lb (61.2 kg)  05/01/23 131 lb (59.4 kg)  better       Sciatic leg pain     Norco prn    Potential benefits of a short term opioids use as well as potential risks (i.e. addiction risk, apnea etc) and complications (i.e. Somnolence, constipation and others) were explained to the patient and were aknowledged.      Hypogonadism in male     Rohith wants to try  Testosterone. Potential benefits of a long term sex steroid  use as well as potential risks (MI, elevation of PSA)  and complications were explained to the patient and were aknowledged. Monitor PSA, LFTs CBC         Meds ordered this encounter  Medications   tadalafil (CIALIS) 20 MG tablet    Sig: Take 1 tablet (20 mg total) by mouth every other day as needed for erectile dysfunction.    Dispense:  5 tablet    Refill:  5    Brand name please   HYDROcodone-acetaminophen (NORCO) 10-325 MG tablet    Sig: Take 1 tablet by mouth every 6 (six) hours as needed. Take by mouth.    Dispense:  60 tablet    Refill:  0      Follow-up: Return in about 3 months (around 11/02/2023) for a follow-up visit.  Sonda Primes, MD

## 2023-08-04 NOTE — Assessment & Plan Note (Signed)
Trial of Cialis brand

## 2023-08-04 NOTE — Assessment & Plan Note (Signed)
Norco prn  Potential benefits of a short term opioids use as well as potential risks (i.e. addiction risk, apnea etc) and complications (i.e. Somnolence, constipation and others) were explained to the patient and were aknowledged.

## 2023-08-04 NOTE — Assessment & Plan Note (Signed)
Chronic Cont on Amlodipine

## 2023-08-04 NOTE — Assessment & Plan Note (Signed)
Wt Readings from Last 3 Encounters:  08/04/23 137 lb (62.1 kg)  06/02/23 135 lb (61.2 kg)  05/01/23 131 lb (59.4 kg)  better

## 2023-09-15 ENCOUNTER — Other Ambulatory Visit: Payer: Self-pay | Admitting: Internal Medicine

## 2023-09-27 ENCOUNTER — Other Ambulatory Visit: Payer: Self-pay | Admitting: Internal Medicine

## 2023-10-13 ENCOUNTER — Other Ambulatory Visit: Payer: Self-pay | Admitting: Internal Medicine

## 2023-10-14 ENCOUNTER — Telehealth: Payer: Self-pay | Admitting: Internal Medicine

## 2023-10-14 ENCOUNTER — Other Ambulatory Visit: Payer: Self-pay | Admitting: Internal Medicine

## 2023-10-14 NOTE — Telephone Encounter (Signed)
Copied from CRM [id number]. Topic: Clinical - Medication Refill >> Oct 14, 2023  2:17 PM Turkey A wrote: Most Recent Primary Care Visit:  Provider: Tresa Garter  Department: [facility]  Visit Type: OFFICE VISIT  Date: 08/04/2023  Medication: tadalafil (CIALIS) 20 MG tablet  Has the patient contacted their pharmacy? No (Agent: If no, request that the patient contact the pharmacy for the refill. If patient does not wish to contact the pharmacy document the reason why and proceed with request.) (Agent: If yes, when and what did the pharmacy advise?)  Is this the correct pharmacy for this prescription? No PLEASE USE [facility] [address] [place], [place] [address]   If no, delete pharmacy and type the correct one.  This is the patient's preferred pharmacy:  [id number] [place], [place] - [address] [place] [address] Phone: [phone] Fax: [phone]  [facility] [address] [place] - [address] [place] [address] Phone: [phone] Fax: [phone]   Has the prescription been filled recently? No  Is the patient out of the medication? Yes  Has the patient been seen for an appointment in the last year OR does the patient have an upcoming appointment? Yes  Can we respond through MyChart? No  Agent: Please be advised that Rx refills may take up to 3 business days. We ask that you follow-up with your pharmacy.

## 2023-10-14 NOTE — Telephone Encounter (Signed)
Copied from CRM [id number]. Topic: Clinical - Medication Refill >> Oct 14, 2023  2:12 PM Theodis Sato wrote: Most Recent Primary Care Visit:  Provider: Tresa Garter  Department: [facility]  Visit Type: OFFICE VISIT  Date: 08/04/2023  Medication: HYDROcodone-acetaminophen (NORCO) 10-325 MG tablet [  Has the patient contacted their pharmacy? No- Controlled substance (Agent: If no, request that the patient contact the pharmacy for the refill. If patient does not wish to contact the pharmacy document the reason why and proceed with request.) (Agent: If yes, when and what did the pharmacy advise?)  Is this the correct pharmacy for this prescription? Yes If no, delete pharmacy and type the correct one.  This is the patient's preferred pharmacy:  [id number] [place], [place] - [address] [place] [address] Phone: [phone] Fax: [phone]  [facility] [address] - [address], [place] - [address] [place] [address] Phone: [phone] Fax: [phone]   Has the prescription been filled recently? Yes  Is the patient out of the medication? Yes  Has the patient been seen for an appointment in the last year OR does the patient have an upcoming appointment? Yes  Can we respond through MyChart? No  Agent: Please be advised that Rx refills may take up to 3 business days. We ask that you follow-up with your pharmacy.

## 2023-10-16 MED ORDER — HYDROCODONE-ACETAMINOPHEN 10-325 MG PO TABS: 1.0000 | ORAL_TABLET | Freq: Four times a day (QID) | ORAL | 0 refills | Status: DC | PRN

## 2023-10-16 NOTE — Addendum Note (Signed)
Addended by: Tresa Garter on: 10/16/2023 08:45 AM   Modules accepted: Orders

## 2023-11-03 ENCOUNTER — Ambulatory Visit: Payer: Medicare Other | Admitting: Internal Medicine

## 2023-11-03 ENCOUNTER — Other Ambulatory Visit: Payer: Self-pay | Admitting: Internal Medicine

## 2023-11-04 ENCOUNTER — Other Ambulatory Visit (INDEPENDENT_AMBULATORY_CARE_PROVIDER_SITE_OTHER)

## 2023-11-04 DIAGNOSIS — E785 Hyperlipidemia, unspecified: Secondary | ICD-10-CM | POA: Diagnosis not present

## 2023-11-04 DIAGNOSIS — N320 Bladder-neck obstruction: Secondary | ICD-10-CM

## 2023-11-04 DIAGNOSIS — R202 Paresthesia of skin: Secondary | ICD-10-CM | POA: Diagnosis not present

## 2023-11-04 DIAGNOSIS — I10 Essential (primary) hypertension: Secondary | ICD-10-CM

## 2023-11-04 LAB — COMPREHENSIVE METABOLIC PANEL
ALT: 14 U/L (ref 0–53)
AST: 15 U/L (ref 0–37)
Albumin: 3.9 g/dL (ref 3.5–5.2)
Alkaline Phosphatase: 65 U/L (ref 39–117)
BUN: 11 mg/dL (ref 6–23)
CO2: 26 meq/L (ref 19–32)
Calcium: 9.4 mg/dL (ref 8.4–10.5)
Chloride: 103 meq/L (ref 96–112)
Creatinine, Ser: 0.96 mg/dL (ref 0.40–1.50)
GFR: 73.73 mL/min (ref >60.00)
Glucose, Bld: 115 mg/dL — ABNORMAL HIGH (ref 70–99)
Potassium: 4 meq/L (ref 3.5–5.1)
Sodium: 137 meq/L (ref 135–145)
Total Bilirubin: 0.4 mg/dL (ref 0.2–1.2)
Total Protein: 6.8 g/dL (ref 6.0–8.3)

## 2023-11-04 LAB — CBC WITH DIFFERENTIAL/PLATELET
Basophils Absolute: 0 10*3/uL (ref 0.0–0.1)
Basophils Relative: 0.4 % (ref 0.0–3.0)
Eosinophils Absolute: 0.2 10*3/uL (ref 0.0–0.7)
Eosinophils Relative: 2.7 % (ref 0.0–5.0)
HCT: 41.5 % (ref 39.0–52.0)
Hemoglobin: 13.9 g/dL (ref 13.0–17.0)
Lymphocytes Relative: 37.8 % (ref 12.0–46.0)
Lymphs Abs: 3.4 10*3/uL (ref 0.7–4.0)
MCHC: 33.6 g/dL (ref 30.0–36.0)
MCV: 102.6 fl — ABNORMAL HIGH (ref 78.0–100.0)
Monocytes Absolute: 0.6 10*3/uL (ref 0.1–1.0)
Monocytes Relative: 6.6 % (ref 3.0–12.0)
Neutro Abs: 4.8 10*3/uL (ref 1.4–7.7)
Neutrophils Relative %: 52.5 % (ref 43.0–77.0)
Platelets: 300 10*3/uL (ref 150.0–400.0)
RBC: 4.04 Mil/uL — ABNORMAL LOW (ref 4.22–5.81)
RDW: 14.3 % (ref 11.5–15.5)
WBC: 9.1 10*3/uL (ref 4.0–10.5)

## 2023-11-04 LAB — TSH: TSH: 2.91 u[IU]/mL (ref 0.35–5.50)

## 2023-11-04 LAB — PSA: PSA: 6.68 ng/mL — ABNORMAL HIGH (ref 0.10–4.00)

## 2023-11-04 LAB — VITAMIN B12: Vitamin B-12: 839 pg/mL (ref 211–911)

## 2023-11-04 LAB — T4, FREE: Free T4: 0.89 ng/dL (ref 0.60–1.60)

## 2023-11-20 ENCOUNTER — Encounter: Payer: Self-pay | Admitting: Internal Medicine

## 2023-11-20 ENCOUNTER — Ambulatory Visit (INDEPENDENT_AMBULATORY_CARE_PROVIDER_SITE_OTHER)

## 2023-11-20 ENCOUNTER — Ambulatory Visit: Admitting: Internal Medicine

## 2023-11-20 VITALS — BP 160/100 | HR 99 | Temp 97.9°F | Ht 72.0 in | Wt 135.4 lb

## 2023-11-20 DIAGNOSIS — M5430 Sciatica, unspecified side: Secondary | ICD-10-CM

## 2023-11-20 DIAGNOSIS — E291 Testicular hypofunction: Secondary | ICD-10-CM | POA: Diagnosis not present

## 2023-11-20 DIAGNOSIS — R634 Abnormal weight loss: Secondary | ICD-10-CM | POA: Diagnosis not present

## 2023-11-20 DIAGNOSIS — M48061 Spinal stenosis, lumbar region without neurogenic claudication: Secondary | ICD-10-CM | POA: Diagnosis not present

## 2023-11-20 DIAGNOSIS — M47816 Spondylosis without myelopathy or radiculopathy, lumbar region: Secondary | ICD-10-CM | POA: Diagnosis not present

## 2023-11-20 DIAGNOSIS — M5126 Other intervertebral disc displacement, lumbar region: Secondary | ICD-10-CM | POA: Diagnosis not present

## 2023-11-20 DIAGNOSIS — K219 Gastro-esophageal reflux disease without esophagitis: Secondary | ICD-10-CM | POA: Diagnosis not present

## 2023-11-20 DIAGNOSIS — M5440 Lumbago with sciatica, unspecified side: Secondary | ICD-10-CM | POA: Diagnosis not present

## 2023-11-20 MED ORDER — HYDROCODONE-ACETAMINOPHEN 10-325 MG PO TABS: 1.0000 | ORAL_TABLET | Freq: Four times a day (QID) | ORAL | 0 refills | Status: DC | PRN

## 2023-11-20 MED ORDER — METHYLPREDNISOLONE 4 MG PO TBPK: ORAL_TABLET | ORAL | 0 refills | Status: DC

## 2023-11-20 MED ORDER — PANTOPRAZOLE SODIUM 40 MG PO TBEC: 40.0000 mg | DELAYED_RELEASE_TABLET | Freq: Every day | ORAL | 3 refills | Status: AC

## 2023-11-20 MED ORDER — TIZANIDINE HCL 2 MG PO CAPS: 2.0000 mg | ORAL_CAPSULE | Freq: Three times a day (TID) | ORAL | 1 refills | Status: DC

## 2023-11-20 NOTE — Assessment & Plan Note (Signed)
 Wt Readings from Last 3 Encounters:  11/20/23 135 lb 6.4 oz (61.4 kg)  08/04/23 137 lb (62.1 kg)  06/02/23 135 lb (61.2 kg)

## 2023-11-20 NOTE — Assessment & Plan Note (Signed)
 New severe pain in the R side of his R flank, R hip and R knee after he tried to reach for something at work. He did not sleep for 2 nights... LS X ray Tizanidine, Medrol, Norco

## 2023-11-20 NOTE — Assessment & Plan Note (Signed)
 New Protonix qd

## 2023-11-20 NOTE — Progress Notes (Signed)
 Subjective:  Patient ID: Brent Turner, male    DOB: September 14, 1940  Age: 83 y.o. MRN: [id number]  CC:    HPI Brent Turner presents for severe pain in the R side of Brent Turner R flank, R hip and R knee after Brent Turner tried to reach for something at work. Brent Turner did not sleep for 2 nights... C/o GERD F/u on LBP  Outpatient Medications Prior to Visit  Medication Sig Dispense Refill   allopurinol (ZYLOPRIM) 300 MG tablet TAKE 1 TABLET BY MOUTH EVERY DAY 90 tablet 2   amLODipine (NORVASC) 5 MG tablet TAKE 1 TABLET (5 MG TOTAL) BY MOUTH DAILY. 90 tablet 3   Cholecalciferol 1000 UNITS tablet Take 1,000 Units by mouth daily.     methocarbamol (ROBAXIN) 500 MG tablet Take 1.5 tablets (750 mg total) by mouth every 6 (six) hours as needed for muscle spasms. 90 tablet 3   SYRINGE-NEEDLE, DISP, 3 ML (BD ECLIPSE SYRINGE/NEEDLE) 23G X 1" 3 ML MISC As directed 50 each 0   tadalafil (CIALIS) 20 MG tablet TAKE 1 TABLET BY MOUTH ONCE DAILY AS NEEDED FOR  ERECTILE  DYSFUNCTION 10 tablet 5   tamsulosin (FLOMAX) 0.4 MG CAPS capsule TAKE 1 CAPSULE BY MOUTH DAILY *NEED APPOINTMENT WITH LABS FOR REFILLS* 90 capsule 3   testosterone cypionate (DEPOTESTOSTERONE CYPIONATE) 200 MG/ML injection INJECT 1 ML (200 MG TOTAL) INTO THE MUSCLE EVERY 14 DAYS 2 mL 5   HYDROcodone-acetaminophen (NORCO) 10-325 MG tablet Take 1 tablet by mouth every 6 (six) hours as needed. Take by mouth. 60 tablet 0   No facility-administered medications prior to visit.    ROS: Review of Systems  Constitutional:  Negative for appetite change, fatigue and unexpected weight change.  HENT:  Negative for congestion, nosebleeds, sneezing, sore throat and trouble swallowing.   Eyes:  Negative for itching and visual disturbance.  Respiratory:  Negative for cough.   Cardiovascular:  Negative for chest pain, palpitations and leg swelling.  Gastrointestinal:  Negative for abdominal distention, blood in stool, diarrhea and nausea.  Genitourinary:  Negative for  frequency and hematuria.  Musculoskeletal:  Negative for back pain, gait problem, joint swelling and neck pain.  Skin:  Negative for rash.  Neurological:  Negative for dizziness, tremors, speech difficulty and weakness.  Psychiatric/Behavioral:  Negative for agitation, dysphoric mood and sleep disturbance. The patient is not nervous/anxious.     Objective:  BP (!) 160/100   Pulse 99   Temp 97.9 F (36.6 C)   Ht 6' (1.829 m)   Wt 135 lb 6.4 oz (61.4 kg)   SpO2 96%   BMI 18.36 kg/m   BP Readings from Last 3 Encounters:  11/20/23 (!) 160/100  08/04/23 110/70  05/01/23 110/80    Wt Readings from Last 3 Encounters:  11/20/23 135 lb 6.4 oz (61.4 kg)  08/04/23 137 lb (62.1 kg)  06/02/23 135 lb (61.2 kg)    Physical Exam Constitutional:      General: Brent Turner is not in acute distress.    Appearance: Normal appearance. Brent Turner is well-developed.     Comments: NAD  Eyes:     Conjunctiva/sclera: Conjunctivae normal.     Pupils: Pupils are equal, round, and reactive to light.  Neck:     Thyroid: No thyromegaly.     Vascular: No JVD.  Cardiovascular:     Rate and Rhythm: Normal rate and regular rhythm.     Heart sounds: Normal heart sounds. No murmur heard.    No friction  rub. No gallop.  Pulmonary:     Effort: Pulmonary effort is normal. No respiratory distress.     Breath sounds: Normal breath sounds. No wheezing or rales.  Chest:     Chest wall: No tenderness.  Abdominal:     General: Bowel sounds are normal. There is no distension.     Palpations: Abdomen is soft. There is no mass.     Tenderness: There is no abdominal tenderness. There is no guarding or rebound.  Musculoskeletal:        General: Tenderness present. Normal range of motion.     Cervical back: Normal range of motion.     Right lower leg: No edema.     Left lower leg: No edema.  Lymphadenopathy:     Cervical: No cervical adenopathy.  Skin:    General: Skin is warm and dry.     Findings: No rash.   Neurological:     Mental Status: Brent Turner is alert and oriented to person, place, and time.     Cranial Nerves: No cranial nerve deficit.     Motor: No abnormal muscle tone.     Coordination: Coordination normal.     Gait: Gait abnormal.     Deep Tendon Reflexes: Reflexes are normal and symmetric.  Psychiatric:        Behavior: Behavior normal.        Thought Content: Thought content normal.        Judgment: Judgment normal.   R LS/R flank - pain w/ROM Str leg elev (-) B Antalgic gait  Lab Results  Component Value Date   WBC 9.1 11/04/2023   HGB 13.9 11/04/2023   HCT 41.5 11/04/2023   PLT 300.0 11/04/2023   GLUCOSE 115 (H) 11/04/2023   CHOL 183 12/02/2022   TRIG 125.0 12/02/2022   HDL 47.60 12/02/2022   LDLDIRECT 106.0 02/14/2020   LDLCALC 111 (H) 12/02/2022   ALT 14 11/04/2023   AST 15 11/04/2023   NA 137 11/04/2023   K 4.0 11/04/2023   CL 103 11/04/2023   CREATININE 0.96 11/04/2023   BUN 11 11/04/2023   CO2 26 11/04/2023   TSH 2.91 11/04/2023   PSA 6.68 (H) 11/04/2023   HGBA1C 7.0 (H) 07/09/2017    DG Lumbar Spine 2-3 Views Result Date: 03/26/2022 CLINICAL DATA:  Extension to L3-L4 PLIF EXAM: LUMBAR SPINE - 2 VIEW COMPARISON:  Lumbar spine radiograph dated February 05, 2021 FINDINGS: Fluoroscopic images were obtained intraoperatively and submitted for post operative interpretation. 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 Prior posterior fusion of L4-S1. Please see the performing provider's procedural report for further detail. IMPRESSION: Fluoroscopic images demonstrate extension of posterior fusion to L3-L4. Electronically Signed   By: Allegra Lai M.D.   On: 03/26/2022 11:34   DG C-Arm 1-60 Min-No Report Result Date: 03/26/2022 Fluoroscopy was utilized by the requesting physician.  No radiographic interpretation.   DG C-Arm 1-60 Min-No Report Result Date:  03/26/2022 Fluoroscopy was utilized by the requesting physician.  No radiographic interpretation.   DG C-Arm 1-60 Min-No Report Result Date: 03/26/2022 Fluoroscopy was utilized by the requesting physician.  No radiographic interpretation.   DG C-Arm 1-60 Min-No Report Result Date: 03/26/2022 Fluoroscopy was utilized by the requesting physician.  No radiographic interpretation.    Assessment & Plan:   Problem List Items Addressed This Visit     Weight loss   Wt  Readings from Last 3 Encounters:  11/20/23 135 lb 6.4 oz (61.4 kg)  08/04/23 137 lb (62.1 kg)  06/02/23 135 lb (61.2 kg)         Sciatic leg pain - Primary   New severe pain in the R side of Brent Turner R flank, R hip and R knee after Brent Turner tried to reach for something at work. Brent Turner did not sleep for 2 nights... LS X ray Tizanidine, Medrol, Norco      Relevant Medications   tizanidine (ZANAFLEX) 2 MG capsule   Other Relevant Orders   DG Lumbar Spine 2-3 Views   Hypogonadism in male    Ganesh wants to continue  Testosterone. Potential benefits of a long term sex steroid  use as well as potential risks (MI, elevation of PSA)  and complications were explained to the patient and were aknowledged. Monitor PSA, LFTs CBC      GERD (gastroesophageal reflux disease)   New Protonix qd      Relevant Medications   pantoprazole (PROTONIX) 40 MG tablet      Meds ordered this encounter  Medications   tizanidine (ZANAFLEX) 2 MG capsule    Sig: Take 1 capsule (2 mg total) by mouth 3 (three) times daily.    Dispense:  60 capsule    Refill:  1   HYDROcodone-acetaminophen (NORCO) 10-325 MG tablet    Sig: Take 1 tablet by mouth every 6 (six) hours as needed. Take by mouth.    Dispense:  60 tablet    Refill:  0   methylPREDNISolone (MEDROL DOSEPAK) 4 MG TBPK tablet    Sig: As directed    Dispense:  21 tablet    Refill:  0   pantoprazole (PROTONIX) 40 MG tablet    Sig: Take 1 tablet (40 mg total) by mouth daily.    Dispense:  90  tablet    Refill:  3      Follow-up: Return in about 4 weeks (around 12/18/2023) for a follow-up visit.  Sonda Primes, MD

## 2023-11-20 NOTE — Assessment & Plan Note (Signed)
  Brent Turner wants to continue  Testosterone. Potential benefits of a long term sex steroid  use as well as potential risks (MI, elevation of PSA)  and complications were explained to the patient and were aknowledged. Monitor PSA, LFTs CBC

## 2023-12-12 ENCOUNTER — Other Ambulatory Visit: Payer: Self-pay | Admitting: Internal Medicine

## 2023-12-12 MED ORDER — HYDROCODONE-ACETAMINOPHEN 10-325 MG PO TABS: 1.0000 | ORAL_TABLET | Freq: Four times a day (QID) | ORAL | 0 refills | Status: DC | PRN

## 2023-12-12 NOTE — Telephone Encounter (Signed)
 Copied from CRM [id number]. Topic: Clinical - Medication Refill >> Dec 12, 2023  1:12 PM Almira Coaster wrote: Most Recent Primary Care Visit:  Provider: Tresa Garter  Department: [facility]  Visit Type: OFFICE VISIT  Date: 11/20/2023  Medication: HYDROcodone-acetaminophen (NORCO) 10-325 MG tablet   Has the patient contacted their pharmacy? Yes, no refills (Agent: If no, request that the patient contact the pharmacy for the refill. If patient does not wish to contact the pharmacy document the reason why and proceed with request.) (Agent: If yes, when and what did the pharmacy advise?)  Is this the correct pharmacy for this prescription? Yes If no, delete pharmacy and type the correct one.  This is the patient's preferred pharmacy:  [id number] [place],  - [address] [place] [address] Phone: [phone] Fax: [phone]    Has the prescription been filled recently? No  Is the patient out of the medication? Yes  Has the patient been seen for an appointment in the last year OR does the patient have an upcoming appointment? Yes  Can we respond through MyChart? Yes  Agent: Please be advised that Rx refills may take up to 3 business days. We ask that you follow-up with your pharmacy.

## 2024-01-01 ENCOUNTER — Ambulatory Visit: Admitting: Internal Medicine

## 2024-01-01 ENCOUNTER — Encounter: Payer: Self-pay | Admitting: Internal Medicine

## 2024-01-01 VITALS — BP 142/80 | HR 89 | Temp 97.7°F | Ht 72.0 in | Wt 133.8 lb

## 2024-01-01 DIAGNOSIS — R634 Abnormal weight loss: Secondary | ICD-10-CM

## 2024-01-01 DIAGNOSIS — M5430 Sciatica, unspecified side: Secondary | ICD-10-CM

## 2024-01-01 DIAGNOSIS — N529 Male erectile dysfunction, unspecified: Secondary | ICD-10-CM

## 2024-01-01 DIAGNOSIS — G5711 Meralgia paresthetica, right lower limb: Secondary | ICD-10-CM | POA: Diagnosis not present

## 2024-01-01 MED ORDER — PREDNISONE 10 MG PO TABS: ORAL_TABLET | ORAL | 1 refills | Status: DC

## 2024-01-01 MED ORDER — TADALAFIL 20 MG PO TABS
20.0000 mg | ORAL_TABLET | Freq: Every day | ORAL | 5 refills | Status: DC | PRN
Start: 2024-01-01 — End: 2024-01-01

## 2024-01-01 MED ORDER — TADALAFIL 20 MG PO TABS: 20.0000 mg | ORAL_TABLET | Freq: Every day | ORAL | 5 refills | Status: DC | PRN

## 2024-01-01 MED ORDER — HYDROCODONE-ACETAMINOPHEN 10-325 MG PO TABS: 1.0000 | ORAL_TABLET | Freq: Three times a day (TID) | ORAL | 0 refills | Status: DC | PRN

## 2024-01-01 NOTE — Progress Notes (Signed)
 Subjective:  Patient ID: Brent Turner, male    DOB: 05-19-1941  Age: 83 y.o. MRN: [id number]  CC: Flank Pain (4 week follow up for right leg, knee, flank pain. Pain still present and notes it had not gotten better. Has since finished prednisone  and tizanidine )   HPI Brent Turner presents for R flank and R LE pain to the knee - no change New c/o numbness and "wet sensation" on the L anterior thigh Not using testosterone  inj any moore  Outpatient Medications Prior to Visit  Medication Sig Dispense Refill   allopurinol  (ZYLOPRIM ) 300 MG tablet TAKE 1 TABLET BY MOUTH EVERY DAY 90 tablet 2   amLODipine  (NORVASC ) 5 MG tablet TAKE 1 TABLET (5 MG TOTAL) BY MOUTH DAILY. 90 tablet 3   Cholecalciferol  1000 UNITS tablet Take 1,000 Units by mouth daily.     methocarbamol  (ROBAXIN ) 500 MG tablet Take 1.5 tablets (750 mg total) by mouth every 6 (six) hours as needed for muscle spasms. 90 tablet 3   pantoprazole  (PROTONIX ) 40 MG tablet Take 1 tablet (40 mg total) by mouth daily. 90 tablet 3   SYRINGE-NEEDLE, DISP, 3 ML (BD ECLIPSE SYRINGE/NEEDLE) 23G X 1" 3 ML MISC As directed 50 each 0   tamsulosin  (FLOMAX ) 0.4 MG CAPS capsule TAKE 1 CAPSULE BY MOUTH DAILY *NEED APPOINTMENT WITH LABS FOR REFILLS* 90 capsule 3   HYDROcodone -acetaminophen  (NORCO) 10-325 MG tablet Take 1 tablet by mouth every 6 (six) hours as needed. Take by mouth. 60 tablet 0   tadalafil  (CIALIS ) 20 MG tablet TAKE 1 TABLET BY MOUTH ONCE DAILY AS NEEDED FOR  ERECTILE  DYSFUNCTION 10 tablet 5   testosterone  cypionate (DEPOTESTOSTERONE CYPIONATE) 200 MG/ML injection INJECT 1 ML (200 MG TOTAL) INTO THE MUSCLE EVERY 14 DAYS 2 mL 5   tizanidine  (ZANAFLEX ) 2 MG capsule Take 1 capsule (2 mg total) by mouth 3 (three) times daily. (Patient not taking: Reported on 01/01/2024) 60 capsule 1   methylPREDNISolone  (MEDROL  DOSEPAK) 4 MG TBPK tablet As directed (Patient not taking: Reported on 01/01/2024) 21 tablet 0   No facility-administered medications  prior to visit.    ROS: Review of Systems  Constitutional:  Positive for fatigue and unexpected weight change. Negative for appetite change.  HENT:  Negative for congestion, nosebleeds, sneezing, sore throat and trouble swallowing.   Eyes:  Negative for itching and visual disturbance.  Respiratory:  Negative for cough.   Cardiovascular:  Negative for chest pain, palpitations and leg swelling.  Gastrointestinal:  Negative for abdominal distention, blood in stool, diarrhea and nausea.  Genitourinary:  Negative for frequency and hematuria.  Musculoskeletal:  Positive for back pain. Negative for gait problem, joint swelling and neck pain.  Skin:  Negative for rash.  Neurological:  Positive for weakness and numbness. Negative for dizziness, tremors and speech difficulty.  Hematological:  Does not bruise/bleed easily.  Psychiatric/Behavioral:  Negative for agitation, dysphoric mood and sleep disturbance. The patient is not nervous/anxious.     Objective:  BP (!) 142/80   Pulse 89   Temp 97.7 F (36.5 C)   Ht 6' (1.829 m)   Wt 133 lb 12.8 oz (60.7 kg)   SpO2 97%   BMI 18.15 kg/m   BP Readings from Last 3 Encounters:  01/01/24 (!) 142/80  11/20/23 (!) 160/100  08/04/23 110/70    Wt Readings from Last 3 Encounters:  01/01/24 133 lb 12.8 oz (60.7 kg)  11/20/23 135 lb 6.4 oz (61.4 kg)  08/04/23 137  lb (62.1 kg)    Physical Exam Constitutional:      General: He is not in acute distress.    Appearance: He is well-developed. He is not toxic-appearing.     Comments: NAD  Eyes:     Conjunctiva/sclera: Conjunctivae normal.     Pupils: Pupils are equal, round, and reactive to light.  Neck:     Thyroid : No thyromegaly.     Vascular: No JVD.  Cardiovascular:     Rate and Rhythm: Normal rate and regular rhythm.     Heart sounds: Normal heart sounds. No murmur heard.    No friction rub. No gallop.  Pulmonary:     Effort: Pulmonary effort is normal. No respiratory distress.      Breath sounds: Normal breath sounds. No wheezing or rales.  Chest:     Chest wall: No tenderness.  Abdominal:     General: Bowel sounds are normal. There is no distension.     Palpations: Abdomen is soft. There is no mass.     Tenderness: There is no abdominal tenderness. There is no guarding or rebound.  Musculoskeletal:        General: Tenderness present. Normal range of motion.     Cervical back: Normal range of motion.     Right lower leg: No edema.     Left lower leg: No edema.  Lymphadenopathy:     Cervical: No cervical adenopathy.  Skin:    General: Skin is warm and dry.     Findings: No rash.  Neurological:     Mental Status: He is alert and oriented to person, place, and time.     Cranial Nerves: No cranial nerve deficit.     Motor: No abnormal muscle tone.     Coordination: Coordination normal.     Gait: Gait normal.     Deep Tendon Reflexes: Reflexes are normal and symmetric.  Psychiatric:        Behavior: Behavior normal.        Thought Content: Thought content normal.        Judgment: Judgment normal.   R anterolat thigh w/numbness, tight belt LS stiff and tender w/ROM Str leg (-) B  Lab Results  Component Value Date   WBC 9.1 11/04/2023   HGB 13.9 11/04/2023   HCT 41.5 11/04/2023   PLT 300.0 11/04/2023   GLUCOSE 115 (H) 11/04/2023   CHOL 183 12/02/2022   TRIG 125.0 12/02/2022   HDL 47.60 12/02/2022   LDLDIRECT 106.0 02/14/2020   LDLCALC 111 (H) 12/02/2022   ALT 14 11/04/2023   AST 15 11/04/2023   NA 137 11/04/2023   K 4.0 11/04/2023   CL 103 11/04/2023   CREATININE 0.96 11/04/2023   BUN 11 11/04/2023   CO2 26 11/04/2023   TSH 2.91 11/04/2023   PSA 6.68 (H) 11/04/2023   HGBA1C 7.0 (H) 07/09/2017    DG Lumbar Spine 2-3 Views Result Date: 03/26/2022 CLINICAL DATA:  Extension to L3-L4 PLIF EXAM: LUMBAR SPINE - 2 VIEW COMPARISON:  Lumbar spine radiograph dated February 05, 2021 FINDINGS: Fluoroscopic images were obtained intraoperatively and submitted  for post operative interpretation. 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 Prior posterior fusion of L4-S1. Please see the performing provider's procedural report for further detail. IMPRESSION: Fluoroscopic images demonstrate extension of posterior fusion to L3-L4. Electronically Signed   By: Avelino Lek M.D.   On:  03/26/2022 11:34   DG C-Arm 1-60 Min-No Report Result Date: 03/26/2022 Fluoroscopy was utilized by the requesting physician.  No radiographic interpretation.   DG C-Arm 1-60 Min-No Report Result Date: 03/26/2022 Fluoroscopy was utilized by the requesting physician.  No radiographic interpretation.   DG C-Arm 1-60 Min-No Report Result Date: 03/26/2022 Fluoroscopy was utilized by the requesting physician.  No radiographic interpretation.   DG C-Arm 1-60 Min-No Report Result Date: 03/26/2022 Fluoroscopy was utilized by the requesting physician.  No radiographic interpretation.    Assessment & Plan:   Problem List Items Addressed This Visit     Erectile dysfunction   Cialis  prn      Weight loss   Better      Relevant Orders   Basic metabolic panel with GFR   Sciatic leg pain - Primary   Not better LS MRI ordered Prednisone  10 mg: take 4 tabs a day x 3 days; then 3 tabs a day x 4 days; then 2 tabs a day x 4 days, then 1 tab a day x 6 days, then stop. Take pc. Norco prn  Potential benefits of a long term opioids use as well as potential risks (i.e. addiction risk, apnea etc) and complications (i.e. Somnolence, constipation and others) were explained to the patient and were aknowledged.        Relevant Orders   MR LUMBAR SPINE W WO CONTRAST   Meralgia paraesthetica, right   New Use suspenders          Meds ordered this encounter  Medications   HYDROcodone -acetaminophen  (NORCO) 10-325 MG tablet    Sig: Take 1 tablet by mouth 3 (three) times daily as  needed for severe pain (pain score 7-10). Take by mouth.    Dispense:  90 tablet    Refill:  0   predniSONE  (DELTASONE ) 10 MG tablet    Sig: Prednisone  10 mg: take 4 tabs a day x 3 days; then 3 tabs a day x 4 days; then 2 tabs a day x 4 days, then 1 tab a day x 6 days, then stop. Take pc.    Dispense:  38 tablet    Refill:  1   DISCONTD: tadalafil  (CIALIS ) 20 MG tablet    Sig: Take 1 tablet (20 mg total) by mouth daily as needed for erectile dysfunction.    Dispense:  10 tablet    Refill:  5   tadalafil  (CIALIS ) 20 MG tablet    Sig: Take 1 tablet (20 mg total) by mouth daily as needed for erectile dysfunction.    Dispense:  10 tablet    Refill:  5      Follow-up: No follow-ups on file.  Anitra Barn, MD

## 2024-01-01 NOTE — Assessment & Plan Note (Addendum)
 New Use suspenders

## 2024-01-01 NOTE — Assessment & Plan Note (Signed)
 Better

## 2024-01-01 NOTE — Assessment & Plan Note (Signed)
Cialis prn 

## 2024-01-01 NOTE — Assessment & Plan Note (Addendum)
 Not better LS MRI ordered Prednisone  10 mg: take 4 tabs a day x 3 days; then 3 tabs a day x 4 days; then 2 tabs a day x 4 days, then 1 tab a day x 6 days, then stop. Take pc. Norco prn  Potential benefits of a long term opioids use as well as potential risks (i.e. addiction risk, apnea etc) and complications (i.e. Somnolence, constipation and others) were explained to the patient and were aknowledged.

## 2024-01-20 ENCOUNTER — Ambulatory Visit
Admission: RE | Admit: 2024-01-20 | Discharge: 2024-01-20 | Disposition: A | Discharge status: Home / Self Care | Source: Ambulatory Visit | Attending: Internal Medicine | Admitting: Internal Medicine

## 2024-01-20 DIAGNOSIS — M5416 Radiculopathy, lumbar region: Secondary | ICD-10-CM | POA: Diagnosis not present

## 2024-01-20 DIAGNOSIS — Z981 Arthrodesis status: Secondary | ICD-10-CM | POA: Diagnosis not present

## 2024-01-20 DIAGNOSIS — M5430 Sciatica, unspecified side: Secondary | ICD-10-CM

## 2024-01-20 MED ORDER — GADOPICLENOL 0.5 MMOL/ML IV SOLN
6.0000 mL | Freq: Once | INTRAVENOUS | Status: AC | PRN
  Administered 2024-01-20: 6 mL via INTRAVENOUS

## 2024-01-23 ENCOUNTER — Ambulatory Visit: Payer: Self-pay | Admitting: Internal Medicine

## 2024-01-23 DIAGNOSIS — M5430 Sciatica, unspecified side: Secondary | ICD-10-CM

## 2024-02-04 ENCOUNTER — Telehealth: Payer: Self-pay | Admitting: Internal Medicine

## 2024-02-04 NOTE — Telephone Encounter (Unsigned)
 Copied from CRM [id number]. Topic: Clinical - Medication Refill >> Feb 04, 2024 10:51 AM Shereese L wrote: Medication: HYDROcodone -acetaminophen  (NORCO) 10-325 MG tablet  Has the patient contacted their pharmacy? Yes (Agent: If no, request that the patient contact the pharmacy for the refill. If patient does not wish to contact the pharmacy document the reason why and proceed with request.) (Agent: If yes, when and what did the pharmacy advise?)  This is the patient's preferred pharmacy:  [id number] [place], [place] - [address] [place] [address] Phone: [phone] Fax: [phone]   Is this the correct pharmacy for this prescription? Yes If no, delete pharmacy and type the correct one.   Has the prescription been filled recently? Yes  Is the patient out of the medication? Yes  Has the patient been seen for an appointment in the last year OR does the patient have an upcoming appointment? Yes  Can we respond through MyChart? No  Agent: Please be advised that Rx refills may take up to 3 business days. We ask that you follow-up with your pharmacy.

## 2024-02-09 ENCOUNTER — Other Ambulatory Visit: Payer: Self-pay | Admitting: Internal Medicine

## 2024-02-09 NOTE — Telephone Encounter (Signed)
 Copied from CRM [id number]. Topic: Clinical - Medication Refill >> Feb 04, 2024 10:51 AM Shereese L wrote: Medication: HYDROcodone -acetaminophen  (NORCO) 10-325 MG tablet  Has the patient contacted their pharmacy? Yes (Agent: If no, request that the patient contact the pharmacy for the refill. If patient does not wish to contact the pharmacy document the reason why and proceed with request.) (Agent: If yes, when and what did the pharmacy advise?)  This is the patient's preferred pharmacy:  [id number] [place], [place] - [address] [place] [address] Phone: [phone] Fax: [phone]   Is this the correct pharmacy for this prescription? Yes If no, delete pharmacy and type the correct one.   Has the prescription been filled recently? Yes  Is the patient out of the medication? Yes  Has the patient been seen for an appointment in the last year OR does the patient have an upcoming appointment? Yes  Can we respond through MyChart? No  Agent: Please be advised that Rx refills may take up to 3 business days. We ask that you follow-up with your pharmacy. >> Feb 09, 2024 11:11 AM Baldo Levan wrote: Patient is calling in stating he is in a lot of pain and needs this medication as soon as possible, if it can be sent in today as he is completely out.

## 2024-02-10 MED ORDER — HYDROCODONE-ACETAMINOPHEN 10-325 MG PO TABS: 1.0000 | ORAL_TABLET | Freq: Three times a day (TID) | ORAL | 0 refills | Status: DC | PRN

## 2024-02-10 NOTE — Telephone Encounter (Signed)
 Okay.  Thanks.

## 2024-02-11 DIAGNOSIS — M5126 Other intervertebral disc displacement, lumbar region: Secondary | ICD-10-CM | POA: Diagnosis not present

## 2024-02-12 ENCOUNTER — Ambulatory Visit: Admitting: Internal Medicine

## 2024-02-12 ENCOUNTER — Encounter: Payer: Self-pay | Admitting: Internal Medicine

## 2024-02-12 ENCOUNTER — Other Ambulatory Visit: Payer: Self-pay | Admitting: Internal Medicine

## 2024-02-12 VITALS — BP 138/80 | HR 75 | Temp 98.1°F | Ht 72.0 in | Wt 138.0 lb

## 2024-02-12 DIAGNOSIS — M48062 Spinal stenosis, lumbar region with neurogenic claudication: Secondary | ICD-10-CM | POA: Diagnosis not present

## 2024-02-12 DIAGNOSIS — I10 Essential (primary) hypertension: Secondary | ICD-10-CM

## 2024-02-12 DIAGNOSIS — M5386 Other specified dorsopathies, lumbar region: Secondary | ICD-10-CM

## 2024-02-12 NOTE — Assessment & Plan Note (Signed)
Chronic Cont on Amlodipine

## 2024-02-12 NOTE — Assessment & Plan Note (Signed)
 Worse Medrol  pac F/u w/Dr Dawley X ray - LS spine Norco prn  Potential benefits of a long term opioids use as well as potential risks (i.e. addiction risk, apnea etc) and complications (i.e. Somnolence, constipation and others) were explained to the patient and were aknowledged.   The pt saw Dr Nat Badger yesterday: epidural inj is scheduled. He may need to have another surgery. The pain is worse some

## 2024-02-12 NOTE — Assessment & Plan Note (Signed)
 MRI report was reviewed  The pt saw Dr Nat Badger yesterday: epidural inj is scheduled. He may need to have another surgery. The pain is worse some

## 2024-02-12 NOTE — Progress Notes (Signed)
 Subjective:  Patient ID: Brent Turner, male    DOB: Oct 29, 1940  Age: 83 y.o. MRN: [id number]  CC: Medical Management of Chronic Issues (6 week follow up Sciatic leg pain worse than last visit. Pt states the neurologist is going to see if a shot in lower back will help.)   HPI Brent Turner presents for sciatica f/u. The pt saw Dr Nat Badger yesterday: epidural inj is scheduled. He may need to have another surgery. The pain is worse some F/u HTN    Outpatient Medications Prior to Visit  Medication Sig Dispense Refill   allopurinol  (ZYLOPRIM ) 300 MG tablet TAKE 1 TABLET BY MOUTH EVERY DAY 90 tablet 2   amLODipine  (NORVASC ) 5 MG tablet TAKE 1 TABLET (5 MG TOTAL) BY MOUTH DAILY. 90 tablet 3   Cholecalciferol  1000 UNITS tablet Take 1,000 Units by mouth daily.     HYDROcodone -acetaminophen  (NORCO) 10-325 MG tablet Take 1 tablet by mouth 3 (three) times daily as needed for severe pain (pain score 7-10). Take by mouth. 90 tablet 0   methocarbamol  (ROBAXIN ) 500 MG tablet Take 1.5 tablets (750 mg total) by mouth every 6 (six) hours as needed for muscle spasms. 90 tablet 3   pantoprazole  (PROTONIX ) 40 MG tablet Take 1 tablet (40 mg total) by mouth daily. 90 tablet 3   predniSONE  (DELTASONE ) 10 MG tablet Prednisone  10 mg: take 4 tabs a day x 3 days; then 3 tabs a day x 4 days; then 2 tabs a day x 4 days, then 1 tab a day x 6 days, then stop. Take pc. 38 tablet 1   SYRINGE-NEEDLE, DISP, 3 ML (BD ECLIPSE SYRINGE/NEEDLE) 23G X 1 3 ML MISC As directed 50 each 0   tadalafil  (CIALIS ) 20 MG tablet TAKE 1 TABLET BY MOUTH ONCE DAILY AS NEEDED FOR ERECTILE DYSFUNCTION 10 tablet 0   tamsulosin  (FLOMAX ) 0.4 MG CAPS capsule TAKE 1 CAPSULE BY MOUTH DAILY *NEED APPOINTMENT WITH LABS FOR REFILLS* 90 capsule 3   tizanidine  (ZANAFLEX ) 2 MG capsule TAKE 1 CAPSULE BY MOUTH 3 TIMES DAILY. 60 capsule 1   No facility-administered medications prior to visit.    ROS: Review of Systems  Constitutional:  Negative for  appetite change, fatigue and unexpected weight change.  HENT:  Negative for congestion, nosebleeds, sneezing, sore throat and trouble swallowing.   Eyes:  Negative for itching and visual disturbance.  Respiratory:  Negative for cough.   Cardiovascular:  Negative for chest pain, palpitations and leg swelling.  Gastrointestinal:  Negative for abdominal distention, blood in stool, diarrhea and nausea.  Genitourinary:  Negative for frequency and hematuria.  Musculoskeletal:  Positive for arthralgias, back pain and gait problem. Negative for joint swelling and neck pain.  Skin:  Negative for rash.  Neurological:  Negative for dizziness, tremors, speech difficulty and weakness.  Psychiatric/Behavioral:  Negative for agitation, dysphoric mood, sleep disturbance and suicidal ideas. The patient is not nervous/anxious.     Objective:  BP 138/80   Pulse 75   Temp 98.1 F (36.7 C) (Oral)   Ht 6' (1.829 m)   Wt 138 lb (62.6 kg)   SpO2 98%   BMI 18.72 kg/m   BP Readings from Last 3 Encounters:  02/12/24 138/80  01/01/24 (!) 142/80  11/20/23 (!) 160/100    Wt Readings from Last 3 Encounters:  02/12/24 138 lb (62.6 kg)  01/01/24 133 lb 12.8 oz (60.7 kg)  11/20/23 135 lb 6.4 oz (61.4 kg)    Physical Exam Constitutional:  General: He is not in acute distress.    Appearance: Normal appearance. He is well-developed.     Comments: NAD   Eyes:     Conjunctiva/sclera: Conjunctivae normal.     Pupils: Pupils are equal, round, and reactive to light.   Neck:     Thyroid : No thyromegaly.     Vascular: No JVD.   Cardiovascular:     Rate and Rhythm: Normal rate and regular rhythm.     Heart sounds: Normal heart sounds. No murmur heard.    No friction rub. No gallop.  Pulmonary:     Effort: Pulmonary effort is normal. No respiratory distress.     Breath sounds: Normal breath sounds. No wheezing or rales.  Chest:     Chest wall: No tenderness.  Abdominal:     General: Bowel sounds  are normal. There is no distension.     Palpations: Abdomen is soft. There is no mass.     Tenderness: There is abdominal tenderness. There is no guarding or rebound.   Musculoskeletal:        General: No tenderness. Normal range of motion.     Cervical back: Normal range of motion.  Lymphadenopathy:     Cervical: No cervical adenopathy.   Skin:    General: Skin is warm and dry.     Findings: No rash.   Neurological:     Mental Status: He is alert and oriented to person, place, and time.     Cranial Nerves: No cranial nerve deficit.     Motor: No abnormal muscle tone.     Coordination: Coordination normal.     Gait: Gait normal.     Deep Tendon Reflexes: Reflexes are normal and symmetric.   Psychiatric:        Behavior: Behavior normal.        Thought Content: Thought content normal.        Judgment: Judgment normal.   Painful LS spine  Lab Results  Component Value Date   WBC 9.1 11/04/2023   HGB 13.9 11/04/2023   HCT 41.5 11/04/2023   PLT 300.0 11/04/2023   GLUCOSE 115 (H) 11/04/2023   CHOL 183 12/02/2022   TRIG 125.0 12/02/2022   HDL 47.60 12/02/2022   LDLDIRECT 106.0 02/14/2020   LDLCALC 111 (H) 12/02/2022   ALT 14 11/04/2023   AST 15 11/04/2023   NA 137 11/04/2023   K 4.0 11/04/2023   CL 103 11/04/2023   CREATININE 0.96 11/04/2023   BUN 11 11/04/2023   CO2 26 11/04/2023   TSH 2.91 11/04/2023   PSA 6.68 (H) 11/04/2023   HGBA1C 7.0 (H) 07/09/2017    MR LUMBAR SPINE W WO CONTRAST Result Date: 01/20/2024 CLINICAL DATA:  Initial evaluation for lumbar radiculopathy, right-sided. History of prior fusion. EXAM: MRI LUMBAR SPINE WITHOUT AND WITH CONTRAST TECHNIQUE: Multiplanar and multiecho pulse sequences of the lumbar spine were obtained without and with intravenous contrast. CONTRAST:  6 mL of Vueway  COMPARISON:  Prior MRI from 11/28/2021 FINDINGS: Segmentation: Standard. Lowest well-formed disc space labeled the L5-S1 level. Alignment: Mild levoscoliosis. Grade 1  degenerative retrolisthesis of T12 on L1 through L2 on L3, with chronic grade 1 anterolisthesis of L4 on L5. Vertebrae: Postoperative changes from prior PLIF at L3-4 through L5-S1. Mild chronic height loss at the superior endplate of L3 with associated Schmorl's node deformity. Vertebral body height otherwise maintained with no acute or recent fracture. Bone marrow signal intensity within normal limits. No worrisome osseous lesions. Degenerative reactive  endplate changes present about the L2-3 and L3-4 interspaces. No other abnormal marrow edema or enhancement. Conus medullaris and cauda equina: Conus extends to the L1 level. Conus medullaris within normal limits. Again seen are multiple small nodular densities associated with the proximal nerve roots of the cauda equina, primarily at the L1 and L2 levels (series 107, images 7-9). Largest of these foci measures 5 mm at the level of L2 (series 107, image 9). 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 Paraspinal and other soft tissues: Chronic postoperative scarring within the posterior paraspinous soft tissues. No acute finding. Multiple T2 hyperintense cyst noted about the kidneys bilaterally, benign in appearance, no follow-up imaging recommended. Disc levels: T11-12: Trace anterolisthesis. Disc desiccation with mild disc bulge and reactive endplate spurring. Bilateral facet hypertrophy. No spinal stenosis. Moderate right with severe left foraminal stenosis. T12-L1: Retrolisthesis. Disc desiccation with diffuse disc bulge and reactive endplate spurring. Mild facet hypertrophy. No spinal stenosis. Moderate bilateral foraminal narrowing. L1-2: Retrolisthesis. Disc desiccation with diffuse disc bulge and reactive endplate spurring. Mild facet hypertrophy. No spinal stenosis. Moderate right with mild left foraminal stenosis. L2-3: Retrolisthesis. Disc desiccation with diffuse disc bulge and disc desiccation.  Superimposed right foraminal disc extrusion with superior migration (series 104, images 14, 12). Disc material closely approximates both the right L2 and descending L3 nerve roots, either which could be affected. Superimposed mild to moderate facet and ligament flavum hypertrophy with small joint effusions. Resultant moderate to severe canal with bilateral subarticular stenosis. Severe bilateral L2 foraminal narrowing. L3-4: Prior PLIF. No residual spinal stenosis. Moderate bilateral L3 foraminal narrowing related to endplate spurring and facet disease. L4-5: Advanced degenerative intervertebral disc space narrowing with bony ankylosis. Prior posterior decompression with fusion. No significant residual spinal stenosis. Moderate bilateral L4 foraminal narrowing related to endplate spurring and facet disease. L5-S1: Prior fusion and posterior decompression. No residual spinal stenosis. Mild left L5 foraminal narrowing. Right neural foramina remains patent. IMPRESSION: 1. Prior PLIF at L3-4 through L5-S1 without residual spinal stenosis. Moderate bilateral L3 and L4 foraminal narrowing related to endplate spurring and facet disease. 2. 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 Additional multifactorial degenerative changes at this level with resultant moderate to severe canal and bilateral subarticular stenosis. This is suspected to be the symptomatic level. 3. 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 Electronically Signed   By: Virgia Griffins M.D.   On: 01/20/2024 18:42    Assessment & Plan:   Problem List Items Addressed This Visit     Essential hypertension - Primary   Chronic Cont on Amlodipine       Sciatica of left side  associated with disorder of lumbar spine   Worse Medrol  pac F/u w/Dr Dawley X ray - LS spine Norco prn  Potential benefits of a long term opioids use as well as potential risks (i.e. addiction risk, apnea etc) and complications (i.e. Somnolence, constipation and others) were explained to the patient and were aknowledged.   The pt saw Dr Nat Badger yesterday: epidural inj is scheduled. He may need to have another surgery. The pain is worse some      Lumbar spinal stenosis   MRI report was reviewed  The pt saw Dr Nat Badger yesterday: epidural inj is scheduled.  He may need to have another surgery. The pain is worse some         No orders of the defined types were placed in this encounter.     Follow-up: No follow-ups on file.  Anitra Barn, MD

## 2024-02-17 ENCOUNTER — Other Ambulatory Visit: Payer: Self-pay | Admitting: Internal Medicine

## 2024-03-04 DIAGNOSIS — M48062 Spinal stenosis, lumbar region with neurogenic claudication: Secondary | ICD-10-CM | POA: Diagnosis not present

## 2024-03-19 ENCOUNTER — Telehealth: Payer: Self-pay | Admitting: Internal Medicine

## 2024-03-19 ENCOUNTER — Other Ambulatory Visit: Payer: Self-pay | Admitting: Internal Medicine

## 2024-03-19 ENCOUNTER — Telehealth: Payer: Self-pay

## 2024-03-19 NOTE — Telephone Encounter (Signed)
 Copied from CRM [id number]. Topic: Clinical - Medication Refill >> Mar 19, 2024 10:54 AM Chasity T wrote: Medication: HYDROcodone -acetaminophen  (NORCO) 10-325 MG tablet   Has the patient contacted their pharmacy? Yes   This is the patient's preferred pharmacy:  [id number] [place], [place] - [address] [place] [address] Phone: [phone] Fax: [phone]   Is this the correct pharmacy for this prescription? Yes If no, delete pharmacy and type the correct one.   Has the prescription been filled recently? Yes  Is the patient out of the medication? Yes  Has the patient been seen for an appointment in the last year OR does the patient have an upcoming appointment? Yes  Can we respond through MyChart? Yes  Agent: Please be advised that Rx refills may take up to 3 business days. We ask that you follow-up with your pharmacy.

## 2024-03-19 NOTE — Telephone Encounter (Unsigned)
 Copied from CRM [id number]. Topic: Clinical - Medication Refill >> Mar 19, 2024 10:54 AM Chasity T wrote: Medication: HYDROcodone -acetaminophen  (NORCO) 10-325 MG tablet   Has the patient contacted their pharmacy? Yes   This is the patient's preferred pharmacy:  [id number] [place], [place] - [address] [place] [address] Phone: [phone] Fax: [phone]   Is this the correct pharmacy for this prescription? Yes If no, delete pharmacy and type the correct one.   Has the prescription been filled recently? Yes  Is the patient out of the medication? Yes  Has the patient been seen for an appointment in the last year OR does the patient have an upcoming appointment? Yes  Can we respond through MyChart? Yes  Agent: Please be advised that Rx refills may take up to 3 business days. We ask that you follow-up with your pharmacy.

## 2024-03-22 MED ORDER — HYDROCODONE-ACETAMINOPHEN 10-325 MG PO TABS
1.0000 | ORAL_TABLET | Freq: Three times a day (TID) | ORAL | 0 refills | Status: DC | PRN
Start: 2024-03-22 — End: 2024-06-08

## 2024-03-22 NOTE — Telephone Encounter (Signed)
 Okay.  Thanks.

## 2024-03-23 NOTE — Addendum Note (Signed)
 Addended by: Luretta Everly V on: 03/23/2024 12:03 AM   Modules accepted: Orders

## 2024-03-29 DIAGNOSIS — Z681 Body mass index (BMI) 19 or less, adult: Secondary | ICD-10-CM | POA: Diagnosis not present

## 2024-03-29 DIAGNOSIS — M5126 Other intervertebral disc displacement, lumbar region: Secondary | ICD-10-CM | POA: Diagnosis not present

## 2024-04-07 DIAGNOSIS — M5126 Other intervertebral disc displacement, lumbar region: Secondary | ICD-10-CM | POA: Diagnosis not present

## 2024-04-07 DIAGNOSIS — Z981 Arthrodesis status: Secondary | ICD-10-CM | POA: Diagnosis not present

## 2024-04-13 ENCOUNTER — Ambulatory Visit: Admitting: Internal Medicine

## 2024-06-02 ENCOUNTER — Ambulatory Visit: Payer: Medicare Other

## 2024-06-02 VITALS — Ht 72.0 in | Wt 138.0 lb

## 2024-06-02 DIAGNOSIS — Z0000 Encounter for general adult medical examination without abnormal findings: Secondary | ICD-10-CM | POA: Diagnosis not present

## 2024-06-02 NOTE — Progress Notes (Cosign Needed Addendum)
 Subjective:   Brent Turner is a 83 y.o. who presents for a Medicare Wellness preventive visit.  As a reminder, Annual Wellness Visits don't include a physical exam, and some assessments may be limited, especially if this visit is performed virtually. We may recommend an in-person follow-up visit with your provider if needed.  Visit Complete: Virtual I connected with  [name] on 06/02/24 by a audio enabled telemedicine application and verified that I am speaking with the correct person using two identifiers.  Patient Location: Home  Provider Location: Office/Clinic  I discussed the limitations of evaluation and management by telemedicine. The patient expressed understanding and agreed to proceed.  Vital Signs: Because this visit was a virtual/telehealth visit, some criteria may be missing or patient reported. Any vitals not documented were not able to be obtained and vitals that have been documented are patient reported.  VideoDeclined- This patient declined Librarian, academic. Therefore the visit was completed with audio only.  Persons Participating in Visit: Patient.  AWV Questionnaire: No: Patient Medicare AWV questionnaire was not completed prior to this visit.  Cardiac Risk Factors include: advanced age (>55men, >18 women);dyslipidemia;hypertension;male gender     Objective:    Today's Vitals   06/02/24 1351  Weight: 138 lb (62.6 kg)  Height: 6' (1.829 m)   Body mass index is 18.72 kg/m.     06/02/2024    2:04 PM 06/02/2023    2:41 PM 05/28/2022   10:13 AM 03/20/2022    2:29 PM 04/21/2020    7:08 AM 04/19/2020    1:11 PM 02/17/2020    3:15 PM  Advanced Directives  Does Patient Have a Medical Advance Directive? No Yes Yes Yes Yes Yes Yes  Type of Furniture conservator/restorer;Living will Healthcare Power of [place];Living will Living will Living will Living will Living will;Healthcare Power of Attorney  Does patient  want to make changes to medical advance directive?    No - Patient declined No - Patient declined No - Patient declined No - Patient declined  Copy of Healthcare Power of Attorney in Chart?  No - copy requested No - copy requested    No - copy requested  Would patient like information on creating a medical advance directive? No - Patient declined          Current Medications (verified) Outpatient Encounter Medications as of 06/02/2024  Medication Sig   allopurinol  (ZYLOPRIM ) 300 MG tablet TAKE 1 TABLET BY MOUTH EVERY DAY   amLODipine  (NORVASC ) 5 MG tablet TAKE 1 TABLET (5 MG TOTAL) BY MOUTH DAILY.   Cholecalciferol  1000 UNITS tablet Take 1,000 Units by mouth daily.   HYDROcodone -acetaminophen  (NORCO) 10-325 MG tablet Take 1 tablet by mouth 3 (three) times daily as needed for severe pain (pain score 7-10). Take by mouth.   methocarbamol  (ROBAXIN ) 500 MG tablet Take 1.5 tablets (750 mg total) by mouth every 6 (six) hours as needed for muscle spasms.   pantoprazole  (PROTONIX ) 40 MG tablet Take 1 tablet (40 mg total) by mouth daily.   predniSONE  (DELTASONE ) 10 MG tablet PREDNISONE  10 MG: TAKE 4 TABS A DAY X 3 DAYS THEN 3 TABS A DAY X 4 DAYS THEN 2 TABS A DAY X 4 DAYS, THEN 1 TAB A DAY X 6 DAYS, THEN STOP. TAKE WITH FOOD   SYRINGE-NEEDLE, DISP, 3 ML (BD ECLIPSE SYRINGE/NEEDLE) 23G X 1 3 ML MISC As directed   tadalafil  (CIALIS ) 20 MG tablet TAKE 1 TABLET  BY MOUTH ONCE DAILY AS NEEDED FOR ERECTILE DYSFUNCTION   tamsulosin  (FLOMAX ) 0.4 MG CAPS capsule TAKE 1 CAPSULE BY MOUTH DAILY *NEED APPOINTMENT WITH LABS FOR REFILLS*   tizanidine  (ZANAFLEX ) 2 MG capsule TAKE 1 CAPSULE BY MOUTH 3 TIMES DAILY.   No facility-administered encounter medications on file as of 06/02/2024.    Allergies (verified) Tetanus toxoid, adsorbed and Tetanus toxoid-containing vaccines   History: Past Medical History:  Diagnosis Date   ED (erectile dysfunction)    Gout    HTN (hypertension)    Vitamin D  deficiency    Past  Surgical History:  Procedure Laterality Date   COLONOSCOPY     EYE SURGERY Bilateral    cataracts removed   LUMBAR DISC SURGERY Left    Family History  Problem Relation Age of Onset   Stroke Mother 39   Cancer Father 69       lung   Social History   Socioeconomic History   Marital status: Widowed    Spouse name: Not on file   Number of children: 5   Years of education: 75   Highest education level: High school graduate  Occupational History   Occupation: hotel maintenance part time  Tobacco Use   Smoking status: Every Day    Current packs/day: 1.00    Average packs/day: 1 pack/day for 50.0 years (50.0 ttl pk-yrs)    Types: Cigarettes   Smokeless tobacco: Never   Tobacco comments:    Nurse discussed 1-800-quit-now  Vaping Use   Vaping status: Never Used  Substance and Sexual Activity   Alcohol use: Yes    Alcohol/week: 7.0 standard drinks of alcohol    Types: 7 Shots of liquor per week    Comment: occasional wine   Drug use: No   Sexual activity: Yes  Other Topics Concern   Not on file  Social History Narrative   Not on file   Social Drivers of Health   Financial Resource Strain: Low Risk  (06/02/2024)   Overall Financial Resource Strain (CARDIA)    Difficulty of Paying Living Expenses: Not hard at all  Food Insecurity: No Food Insecurity (06/02/2024)   Hunger Vital Sign    Worried About Running Out of Food in the Last Year: Never true    Ran Out of Food in the Last Year: Never true  Transportation Needs: No Transportation Needs (06/02/2024)   PRAPARE - Administrator, Civil Service (Medical): No    Lack of Transportation (Non-Medical): No  Physical Activity: Insufficiently Active (06/02/2024)   Exercise Vital Sign    Days of Exercise per Week: 7 days    Minutes of Exercise per Session: 20 min  Stress: No Stress Concern Present (06/02/2024)   [employer] of Occupational Health - Occupational Stress Questionnaire    Feeling of Stress: Not at  all  Social Connections: Moderately Integrated (06/02/2024)   Social Connection and Isolation Panel    Frequency of Communication with Friends and Family: Twice a week    Frequency of Social Gatherings with Friends and Family: Twice a week    Attends Religious Services: 1 to 4 times per year    Active Member of [employer] or Organizations: No    Attends Banker Meetings: 1 to 4 times per year    Marital Status: Widowed    Tobacco Counseling Ready to quit: No Counseling given: Yes Tobacco comments: Nurse discussed 1-800-quit-now    Clinical Intake:  Pre-visit preparation completed: Yes  Pain :  No/denies pain     BMI - recorded: 18.72 Nutritional Status: BMI <19  Underweight Nutritional Risks: None Diabetes: No  Lab Results  Component Value Date   HGBA1C 7.0 (H) 07/09/2017   HGBA1C 7.4 (H) 01/28/2007     How often do you need to have someone help you when you read instructions, pamphlets, or other written materials from your doctor or pharmacy?: 1 - Never  Interpreter Needed?: No  Information entered by :: Verdie Saba, CMA   Activities of Daily Living     06/02/2024    1:54 PM  In your present state of health, do you have any difficulty performing the following activities:  Hearing? 0  Comment wears hearing aids  Vision? 0  Difficulty concentrating or making decisions? 0  Walking or climbing stairs? 0  Dressing or bathing? 0  Doing errands, shopping? 0  Preparing Food and eating ? N  Using the Toilet? N  In the past six months, have you accidently leaked urine? N  Do you have problems with loss of bowel control? N  Managing your Medications? N  Managing your Finances? N  Housekeeping or managing your Housekeeping? N    Patient Care Team: [name], Karlynn GAILS, MD as PCP - General Radionchenko, [place] GAILS, MD as Referring Physician (Ophthalmology) Abran Norleen SAILOR, MD as Consulting Physician (Gastroenterology) Lanis Pupa, MD as Consulting  Physician (Neurosurgery)  I have updated your Care Teams any recent Medical Services you may have received from other providers in the past year.     Assessment:   This is a routine wellness examination for [place].  Hearing/Vision screen Hearing Screening - Comments:: Wears hearing aids Vision Screening - Comments:: Denies vision concerns - plans to schedule an appt w/Yulia Radionchenko   Goals Addressed               This Visit's Progress     Patient Stated (pt-stated)        Patient stated he plans to continue walking and will start trying physical therapy exercises for back since having had surgery       Depression Screen     06/02/2024    1:55 PM 02/12/2024    1:25 PM 01/01/2024    1:15 PM 08/04/2023    3:38 PM 06/02/2023    2:42 PM 05/01/2023    4:12 PM 01/21/2023    1:52 PM  PHQ 2/9 Scores  PHQ - 2 Score 0 0 2 0 0 0 0  PHQ- 9 Score 0  13  0  0    Fall Risk     06/02/2024    1:54 PM 02/12/2024    1:25 PM 11/20/2023    1:46 PM 08/04/2023    3:38 PM 06/02/2023    2:42 PM  Fall Risk   Falls in the past year? 0 0 0 0 0  Number falls in past yr: 0 0 0 0 0  Injury with Fall? 0 0 0 0 0  Risk for fall due to : No Fall Risks  No Fall Risks No Fall Risks No Fall Risks  Follow up Falls evaluation completed;Falls prevention discussed  Falls evaluation completed Falls evaluation completed Falls prevention discussed    MEDICARE RISK AT HOME:  Medicare Risk at Home Any stairs in or around the home?: No If so, are there any without handrails?: No Home free of loose throw rugs in walkways, pet beds, electrical cords, etc?: Yes Adequate lighting in your home to reduce risk of  falls?: Yes Life alert?: No Use of a cane, walker or w/c?: No Grab bars in the bathroom?: Yes Shower chair or bench in shower?: No Elevated toilet seat or a handicapped toilet?: No  TIMED UP AND GO:  Was the test performed?  No  Cognitive Function: 6CIT completed    05/01/2016    2:14 PM  MMSE -  Mini Mental State Exam  Not completed: --        06/02/2024    1:56 PM 06/02/2023    2:52 PM 05/28/2022   10:15 AM  6CIT Screen  What Year? 0 points 0 points 0 points  What month? 0 points 0 points   What time? 0 points 0 points 0 points  Count back from 20 0 points 0 points 0 points  Months in reverse 0 points 0 points 2 points  Repeat phrase 0 points 0 points 0 points  Total Score 0 points 0 points     Immunizations Immunization History  Administered Date(s) Administered   Fluad Quad(high Dose 65+) 08/19/2019   INFLUENZA, HIGH DOSE SEASONAL PF 07/16/2013, 05/01/2016, 07/09/2017   Influenza Split 07/13/2012   Influenza Whole 07/31/2010, 05/14/2011   Influenza,inj,Quad PF,6+ Mos 07/26/2014   Moderna Covid-19 Vaccine Bivalent Booster 10yrs & up 08/22/2020   Moderna Sars-Covid-2 Vaccination 02/02/2020, 03/09/2020   Pneumococcal Conjugate-13 12/28/2020   Pneumococcal Polysaccharide-23 01/28/2007    Screening Tests Health Maintenance  Topic Date Due   DTaP/Tdap/Td (1 - Tdap) Never done   Influenza Vaccine  04/02/2024   COVID-19 Vaccine (4 - 2025-26 season) 05/03/2024   Medicare Annual Wellness (AWV)  06/02/2025   Pneumococcal Vaccine: 50+ Years  Completed   HPV VACCINES  Aged Out   Meningococcal B Vaccine  Aged Out   Zoster Vaccines- Shingrix  Discontinued    Health Maintenance Items Addressed:  I have recommended that this patient have a tetanus booster but he declines at this time. I have discussed the risks and benefits of this procedure with him. The patient verbalizes understanding.   Additional Screening:  Vision Screening: Recommended annual ophthalmology exams for early detection of glaucoma and other disorders of the eye. Is the patient up to date with their annual eye exam?  No - pt plans to contact office to schedule an appt  Who is the provider or what is the name of the office in which the patient attends annual eye exams? [facility]  Dental  Screening: Recommended annual dental exams for proper oral hygiene  Community Resource Referral / Chronic Care Management: CRR required this visit?  No   CCM required this visit?  No   Plan:    I have personally reviewed and noted the following in the patient's chart:   Medical and social history Use of alcohol, tobacco or illicit drugs  Current medications and supplements including opioid prescriptions. Patient is currently taking an opioid prescription. Functional ability and status Nutritional status Physical activity Advanced directives List of other physicians Hospitalizations, surgeries, and ER visits in previous 12 months Vitals Screenings to include cognitive, depression, and falls Referrals and appointments  In addition, I have reviewed and discussed with patient certain preventive protocols, quality metrics, and best practice recommendations. A written personalized care plan for preventive services as well as general preventive health recommendations were provided to patient.   Verdie CHRISTELLA Saba, CMA   06/02/2024   After Visit Summary: (Declined) Due to this being a telephonic visit, with patients personalized plan was offered to patient but patient Declined  AVS at this time   Notes: Scheduled a 4-mth f/u w/PCP.  Medical screening examination/treatment/procedure(s) were performed by non-physician practitioner and as supervising physician I was immediately available for consultation/collaboration.  I agree with above. Karlynn Noel, MD

## 2024-06-02 NOTE — Patient Instructions (Addendum)
 Brent Turner,  Thank you for taking the time for your Medicare Wellness Visit. I appreciate your continued commitment to your health goals. Please review the care plan we discussed, and feel free to reach out if I can assist you further.  Medicare recommends these wellness visits once per year to help you and your care team stay ahead of potential health issues. These visits are designed to focus on prevention, allowing your provider to concentrate on managing your acute and chronic conditions during your regular appointments.  Please note that Annual Wellness Visits do not include a physical exam. Some assessments may be limited, especially if the visit was conducted virtually. If needed, we may recommend a separate in-person follow-up with your provider.  Ongoing Care Seeing your primary care provider every 3 to 6 months helps us  monitor your health and provide consistent, personalized care.   Referrals If a referral was made during today's visit and you haven't received any updates within two weeks, please contact the referred provider directly to check on the status.  Recommended Screenings:  Health Maintenance  Topic Date Due   DTaP/Tdap/Td vaccine (1 - Tdap) Never done   Flu Shot  04/02/2024   COVID-19 Vaccine (4 - 2025-26 season) 05/03/2024   Medicare Annual Wellness Visit  06/02/2025   Pneumococcal Vaccine for age over 10  Completed   HPV Vaccine  Aged Out   Meningitis B Vaccine  Aged Out   Zoster (Shingles) Vaccine  Discontinued       06/02/2024    2:04 PM  Advanced Directives  Does Patient Have a Medical Advance Directive? No  Would patient like information on creating a medical advance directive? No - Patient declined   Advance Care Planning is important because it: Ensures you receive medical care that aligns with your values, goals, and preferences. Provides guidance to your family and loved ones, reducing the emotional burden of decision-making during critical  moments.  Vision: Annual vision screenings are recommended for early detection of glaucoma, cataracts, and diabetic retinopathy. These exams can also reveal signs of chronic conditions such as diabetes and high blood pressure.  Dental: Annual dental screenings help detect early signs of oral cancer, gum disease, and other conditions linked to overall health, including heart disease and diabetes.

## 2024-06-08 ENCOUNTER — Ambulatory Visit: Admitting: Internal Medicine

## 2024-06-08 ENCOUNTER — Encounter: Payer: Self-pay | Admitting: Internal Medicine

## 2024-06-08 VITALS — BP 128/72 | HR 93 | Temp 98.6°F | Ht 72.0 in | Wt 133.2 lb

## 2024-06-08 DIAGNOSIS — Z23 Encounter for immunization: Secondary | ICD-10-CM

## 2024-06-08 DIAGNOSIS — M5430 Sciatica, unspecified side: Secondary | ICD-10-CM

## 2024-06-08 DIAGNOSIS — M48062 Spinal stenosis, lumbar region with neurogenic claudication: Secondary | ICD-10-CM | POA: Diagnosis not present

## 2024-06-08 DIAGNOSIS — R634 Abnormal weight loss: Secondary | ICD-10-CM

## 2024-06-08 MED ORDER — ENSURE ENLIVE PO LIQD: 237.0000 mL | Freq: Two times a day (BID) | ORAL | 2 refills | Status: AC

## 2024-06-08 MED ORDER — PREDNISONE 10 MG PO TABS: ORAL_TABLET | ORAL | 1 refills | Status: DC

## 2024-06-08 MED ORDER — HYDROCODONE-ACETAMINOPHEN 10-325 MG PO TABS: 1.0000 | ORAL_TABLET | Freq: Three times a day (TID) | ORAL | 0 refills | Status: DC | PRN

## 2024-06-08 NOTE — Assessment & Plan Note (Signed)
 S/p right  L2-3 microdiskectomy and laminectomy surgery in summer 2025 - Dr Darlin . States surgery is not going well at all. States he still has back pain in the same areas as before the surgery. He was working at [employer] - not back to work yet  Medrol  taper Start PT Norco prn RTC 6 weeks Eat better

## 2024-06-08 NOTE — Assessment & Plan Note (Signed)
 Use Ensure plus bid Smoke less!

## 2024-06-08 NOTE — Progress Notes (Signed)
 Subjective:  Patient ID: Brent Turner, male    DOB: [birth year]  Age: 83 y.o. MRN: [id number]  CC: Follow-up (Patient stated he had surgery. States surgery is not going well at all. States he still has back pain in the same areas as before the surgery. )   HPI Brent Turner presents for follow-up s/p right  L2-3 microdiskectomy and laminectomy surgery in summer 2025 - Dr Darlin . States surgery is not going well at all. States he still has back pain in the same areas as before the surgery. He was working at [employer] - not back to work yet Pin is severe  - worse at night  F/u on gout   Outpatient Medications Prior to Visit  Medication Sig Dispense Refill   allopurinol  (ZYLOPRIM ) 300 MG tablet TAKE 1 TABLET BY MOUTH EVERY DAY 90 tablet 2   amLODipine  (NORVASC ) 5 MG tablet TAKE 1 TABLET (5 MG TOTAL) BY MOUTH DAILY. 90 tablet 3   Cholecalciferol  1000 UNITS tablet Take 1,000 Units by mouth daily.     methocarbamol  (ROBAXIN ) 500 MG tablet Take 1.5 tablets (750 mg total) by mouth every 6 (six) hours as needed for muscle spasms. 90 tablet 3   pantoprazole  (PROTONIX ) 40 MG tablet Take 1 tablet (40 mg total) by mouth daily. 90 tablet 3   SYRINGE-NEEDLE, DISP, 3 ML (BD ECLIPSE SYRINGE/NEEDLE) 23G X 1 3 ML MISC As directed 50 each 0   tadalafil  (CIALIS ) 20 MG tablet TAKE 1 TABLET BY MOUTH ONCE DAILY AS NEEDED FOR ERECTILE DYSFUNCTION 10 tablet 0   tamsulosin  (FLOMAX ) 0.4 MG CAPS capsule TAKE 1 CAPSULE BY MOUTH DAILY *NEED APPOINTMENT WITH LABS FOR REFILLS* 90 capsule 3   tizanidine  (ZANAFLEX ) 2 MG capsule TAKE 1 CAPSULE BY MOUTH 3 TIMES DAILY. 60 capsule 1   HYDROcodone -acetaminophen  (NORCO) 10-325 MG tablet Take 1 tablet by mouth 3 (three) times daily as needed for severe pain (pain score 7-10). Take by mouth. 90 tablet 0   predniSONE  (DELTASONE ) 10 MG tablet PREDNISONE  10 MG: TAKE 4 TABS A DAY X 3 DAYS THEN 3 TABS A DAY X 4 DAYS THEN 2 TABS A DAY X 4 DAYS, THEN 1 TAB A DAY X 6 DAYS,  THEN STOP. TAKE WITH FOOD 38 tablet 1   No facility-administered medications prior to visit.    ROS: Review of Systems  Constitutional:  Positive for fatigue and unexpected weight change. Negative for appetite change.  HENT:  Negative for congestion, nosebleeds, sneezing, sore throat and trouble swallowing.   Eyes:  Negative for itching and visual disturbance.  Respiratory:  Negative for cough.   Cardiovascular:  Negative for chest pain, palpitations and leg swelling.  Gastrointestinal:  Negative for abdominal distention, blood in stool, diarrhea and nausea.  Genitourinary:  Negative for frequency and hematuria.  Musculoskeletal:  Positive for arthralgias, back pain and gait problem. Negative for joint swelling and neck pain.  Skin:  Negative for rash.  Neurological:  Negative for dizziness, tremors, speech difficulty and weakness.  Psychiatric/Behavioral:  Negative for agitation, dysphoric mood and sleep disturbance. The patient is not nervous/anxious.     Objective:  BP 128/72   Pulse 93   Temp 98.6 F (37 C) (Oral)   Ht 6' (1.829 m)   Wt 133 lb 3.2 oz (60.4 kg)   SpO2 97%   BMI 18.07 kg/m   BP Readings from Last 3 Encounters:  06/08/24 128/72  02/12/24 138/80  01/01/24 (!) 142/80  Wt Readings from Last 3 Encounters:  06/08/24 133 lb 3.2 oz (60.4 kg)  06/02/24 138 lb (62.6 kg)  02/12/24 138 lb (62.6 kg)    Physical Exam Constitutional:      General: He is not in acute distress.    Appearance: Normal appearance. He is well-developed.     Comments: NAD  Eyes:     Conjunctiva/sclera: Conjunctivae normal.     Pupils: Pupils are equal, round, and reactive to light.  Neck:     Thyroid : No thyromegaly.     Vascular: No JVD.  Cardiovascular:     Rate and Rhythm: Normal rate and regular rhythm.     Heart sounds: Normal heart sounds. No murmur heard.    No friction rub. No gallop.  Pulmonary:     Effort: Pulmonary effort is normal. No respiratory distress.      Breath sounds: Normal breath sounds. No wheezing or rales.  Chest:     Chest wall: No tenderness.  Abdominal:     General: Bowel sounds are normal. There is no distension.     Palpations: Abdomen is soft. There is no mass.     Tenderness: There is no abdominal tenderness. There is no guarding or rebound.  Musculoskeletal:        General: Tenderness present. Normal range of motion.     Cervical back: Normal range of motion.     Right lower leg: No edema.     Left lower leg: No edema.  Lymphadenopathy:     Cervical: No cervical adenopathy.  Skin:    General: Skin is warm and dry.     Findings: No rash.  Neurological:     Mental Status: He is alert and oriented to person, place, and time.     Cranial Nerves: No cranial nerve deficit.     Motor: No abnormal muscle tone.     Coordination: Coordination normal.     Gait: Gait abnormal.     Deep Tendon Reflexes: Reflexes are normal and symmetric.  Psychiatric:        Behavior: Behavior normal.        Thought Content: Thought content normal.        Judgment: Judgment normal.   Using a cane Unsteady gait Stooped posture    Lab Results  Component Value Date   WBC 9.1 11/04/2023   HGB 13.9 11/04/2023   HCT 41.5 11/04/2023   PLT 300.0 11/04/2023   GLUCOSE 115 (H) 11/04/2023   CHOL 183 12/02/2022   TRIG 125.0 12/02/2022   HDL 47.60 12/02/2022   LDLDIRECT 106.0 02/14/2020   LDLCALC 111 (H) 12/02/2022   ALT 14 11/04/2023   AST 15 11/04/2023   NA 137 11/04/2023   K 4.0 11/04/2023   CL 103 11/04/2023   CREATININE 0.96 11/04/2023   BUN 11 11/04/2023   CO2 26 11/04/2023   TSH 2.91 11/04/2023   PSA 6.68 (H) 11/04/2023   HGBA1C 7.0 (H) 07/09/2017    MR LUMBAR SPINE W WO CONTRAST Result Date: 01/20/2024 CLINICAL DATA:  Initial evaluation for lumbar radiculopathy, right-sided. History of prior fusion. EXAM: MRI LUMBAR SPINE WITHOUT AND WITH CONTRAST TECHNIQUE: Multiplanar and multiecho pulse sequences of the lumbar spine were  obtained without and with intravenous contrast. CONTRAST:  6 mL of Vueway  COMPARISON:  Prior MRI from 11/28/2021 FINDINGS: Segmentation: Standard. Lowest well-formed disc space labeled the L5-S1 level. Alignment: Mild levoscoliosis. Grade 1 degenerative retrolisthesis of T12 on L1 through L2 on L3, with chronic grade  1 anterolisthesis of L4 on L5. Vertebrae: Postoperative changes from prior PLIF at L3-4 through L5-S1. Mild chronic height loss at the superior endplate of L3 with associated Schmorl's node deformity. Vertebral body height otherwise maintained with no acute or recent fracture. Bone marrow signal intensity within normal limits. No worrisome osseous lesions. Degenerative reactive endplate changes present about the L2-3 and L3-4 interspaces. No other abnormal marrow edema or enhancement. Conus medullaris and cauda equina: Conus extends to the L1 level. Conus medullaris within normal limits. Again seen are multiple small nodular densities associated with the proximal nerve roots of the cauda equina, primarily at the L1 and L2 levels (series 107, images 7-9). Largest of these foci measures 5 mm at the level of L2 (series 107, image 9). 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 Paraspinal and other soft tissues: Chronic postoperative scarring within the posterior paraspinous soft tissues. No acute finding. Multiple T2 hyperintense cyst noted about the kidneys bilaterally, benign in appearance, no follow-up imaging recommended. Disc levels: T11-12: Trace anterolisthesis. Disc desiccation with mild disc bulge and reactive endplate spurring. Bilateral facet hypertrophy. No spinal stenosis. Moderate right with severe left foraminal stenosis. T12-L1: Retrolisthesis. Disc desiccation with diffuse disc bulge and reactive endplate spurring. Mild facet hypertrophy. No spinal stenosis. Moderate bilateral foraminal narrowing. L1-2: Retrolisthesis. Disc  desiccation with diffuse disc bulge and reactive endplate spurring. Mild facet hypertrophy. No spinal stenosis. Moderate right with mild left foraminal stenosis. L2-3: Retrolisthesis. Disc desiccation with diffuse disc bulge and disc desiccation. Superimposed right foraminal disc extrusion with superior migration (series 104, images 14, 12). Disc material closely approximates both the right L2 and descending L3 nerve roots, either which could be affected. Superimposed mild to moderate facet and ligament flavum hypertrophy with small joint effusions. Resultant moderate to severe canal with bilateral subarticular stenosis. Severe bilateral L2 foraminal narrowing. L3-4: Prior PLIF. No residual spinal stenosis. Moderate bilateral L3 foraminal narrowing related to endplate spurring and facet disease. L4-5: Advanced degenerative intervertebral disc space narrowing with bony ankylosis. Prior posterior decompression with fusion. No significant residual spinal stenosis. Moderate bilateral L4 foraminal narrowing related to endplate spurring and facet disease. L5-S1: Prior fusion and posterior decompression. No residual spinal stenosis. Mild left L5 foraminal narrowing. Right neural foramina remains patent. IMPRESSION: 1. Prior PLIF at L3-4 through L5-S1 without residual spinal stenosis. Moderate bilateral L3 and L4 foraminal narrowing related to endplate spurring and facet disease. 2. 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 Additional multifactorial degenerative changes at this level with resultant moderate to severe canal and bilateral subarticular stenosis. This is suspected to be the symptomatic level. 3. 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  Electronically Signed   By: Morene Hoard M.D.   On: 01/20/2024 18:42    Assessment & Plan:   Problem List Items Addressed This Visit     Lumbar spinal stenosis - Primary   S/p right  L2-3 microdiskectomy and laminectomy surgery in summer 2025 - Dr Darlin . States surgery is not going well at all. States he still has back pain in the same areas as before the surgery. He was working at [employer] - not back to work yet  Medrol  taper Start PT Norco prn RTC 6 weeks Eat  better      Relevant Orders   Ambulatory referral to Physical Therapy   Sciatic leg pain   S/p right  L2-3 microdiskectomy and laminectomy surgery in summer 2025 - Dr Darlin . States surgery is not going well at all. States he still has back pain in the same areas as before the surgery. He was working at [employer] - not back to work yet  Medrol  taper Start PT Norco prn RTC 6 weeks Eat better      Relevant Orders   Ambulatory referral to Physical Therapy   Weight loss   Use Ensure plus bid Smoke less!      Other Visit Diagnoses       Immunization due       Relevant Orders   Flu vaccine HIGH DOSE PF(Fluzone Trivalent) (Completed)         Meds ordered this encounter  Medications   feeding supplement (ENSURE ENLIVE / ENSURE PLUS) LIQD    Sig: Take 237 mLs by mouth 2 (two) times daily between meals.    Dispense:  60 mL    Refill:  2   predniSONE  (DELTASONE ) 10 MG tablet    Sig: PREDNISONE  10 MG: TAKE 4 TABS A DAY X 3 DAYS THEN 3 TABS A DAY X 4 DAYS THEN 2 TABS A DAY X 4 DAYS, THEN 1 TAB A DAY X 6 DAYS, THEN STOP. TAKE WITH FOOD    Dispense:  38 tablet    Refill:  1   HYDROcodone -acetaminophen  (NORCO) 10-325 MG tablet    Sig: Take 1 tablet by mouth 3 (three) times daily as needed for severe pain (pain score 7-10). Take by mouth.    Dispense:  90 tablet    Refill:  0      Follow-up: Return in about 4 weeks (around 07/06/2024) for a follow-up visit.  Marolyn Noel, MD

## 2024-07-07 ENCOUNTER — Encounter: Payer: Self-pay | Admitting: Internal Medicine

## 2024-07-07 ENCOUNTER — Ambulatory Visit (INDEPENDENT_AMBULATORY_CARE_PROVIDER_SITE_OTHER): Admitting: Internal Medicine

## 2024-07-07 VITALS — BP 130/84 | HR 90 | Temp 97.7°F | Ht 72.0 in | Wt 133.0 lb

## 2024-07-07 DIAGNOSIS — R634 Abnormal weight loss: Secondary | ICD-10-CM

## 2024-07-07 DIAGNOSIS — E291 Testicular hypofunction: Secondary | ICD-10-CM

## 2024-07-07 DIAGNOSIS — M48062 Spinal stenosis, lumbar region with neurogenic claudication: Secondary | ICD-10-CM

## 2024-07-07 DIAGNOSIS — M1000 Idiopathic gout, unspecified site: Secondary | ICD-10-CM

## 2024-07-07 MED ORDER — ALLOPURINOL 300 MG PO TABS
300.0000 mg | ORAL_TABLET | Freq: Every day | ORAL | 2 refills | Status: AC
Start: 2024-07-07 — End: ?

## 2024-07-07 MED ORDER — AMLODIPINE BESYLATE 5 MG PO TABS: 5.0000 mg | ORAL_TABLET | Freq: Every day | ORAL | 3 refills | Status: DC

## 2024-07-07 MED ORDER — HYDROCODONE-ACETAMINOPHEN 10-325 MG PO TABS: 1.0000 | ORAL_TABLET | Freq: Three times a day (TID) | ORAL | 0 refills | Status: DC | PRN

## 2024-07-07 MED ORDER — TESTOSTERONE 10 MG/ACT (2%) TD GEL: 10.0000 mg | Freq: Every morning | TRANSDERMAL | 5 refills | Status: DC

## 2024-07-07 MED ORDER — PREDNISONE 10 MG PO TABS: ORAL_TABLET | ORAL | 1 refills | Status: AC

## 2024-07-07 MED ORDER — CYCLOBENZAPRINE HCL 10 MG PO TABS: 10.0000 mg | ORAL_TABLET | Freq: Three times a day (TID) | ORAL | 0 refills | Status: AC | PRN

## 2024-07-07 MED ORDER — TADALAFIL 20 MG PO TABS: 20.0000 mg | ORAL_TABLET | ORAL | 2 refills | Status: AC | PRN

## 2024-07-07 NOTE — Assessment & Plan Note (Signed)
  Brent Turner wants to continue  Testosterone , but change to T gel. Potential benefits of a long term sex steroid  use as well as potential risks (MI, elevation of PSA)  and complications were explained to the patient and were aknowledged. Monitor PSA, LFTs CBC

## 2024-07-07 NOTE — Assessment & Plan Note (Signed)
 Stable Diet discussed

## 2024-07-07 NOTE — Assessment & Plan Note (Signed)
 S/p right  L2-3 microdiskectomy and laminectomy surgery in summer 2025 - Dr Darlin . States surgery is not going well at all. States he still has back pain in the same areas as before the surgery. He was working at [employer] - not back to work yet  Medrol  taper Start PT Norco prn RTC 6 weeks Eat better

## 2024-07-07 NOTE — Progress Notes (Signed)
 Subjective:  Patient ID: Brent Turner, male    DOB: February 14, 1941  Age: 83 y.o. MRN: [id number]  CC: Medical Management of Chronic Issues (4 Month follow up)   HPI Brent Turner presents for LBP, Wt loss, hypogonadism  Outpatient Medications Prior to Visit  Medication Sig Dispense Refill   Cholecalciferol  1000 UNITS tablet Take 1,000 Units by mouth daily.     feeding supplement (ENSURE ENLIVE / ENSURE PLUS) LIQD Take 237 mLs by mouth 2 (two) times daily between meals. 60 mL 2   methocarbamol  (ROBAXIN ) 500 MG tablet Take 1.5 tablets (750 mg total) by mouth every 6 (six) hours as needed for muscle spasms. 90 tablet 3   pantoprazole  (PROTONIX ) 40 MG tablet Take 1 tablet (40 mg total) by mouth daily. 90 tablet 3   SYRINGE-NEEDLE, DISP, 3 ML (BD ECLIPSE SYRINGE/NEEDLE) 23G X 1 3 ML MISC As directed 50 each 0   tamsulosin  (FLOMAX ) 0.4 MG CAPS capsule TAKE 1 CAPSULE BY MOUTH DAILY *NEED APPOINTMENT WITH LABS FOR REFILLS* 90 capsule 3   tizanidine  (ZANAFLEX ) 2 MG capsule TAKE 1 CAPSULE BY MOUTH 3 TIMES DAILY. 60 capsule 1   allopurinol  (ZYLOPRIM ) 300 MG tablet TAKE 1 TABLET BY MOUTH EVERY DAY 90 tablet 2   amLODipine  (NORVASC ) 5 MG tablet TAKE 1 TABLET (5 MG TOTAL) BY MOUTH DAILY. 90 tablet 3   HYDROcodone -acetaminophen  (NORCO) 10-325 MG tablet Take 1 tablet by mouth 3 (three) times daily as needed for severe pain (pain score 7-10). Take by mouth. 90 tablet 0   predniSONE  (DELTASONE ) 10 MG tablet PREDNISONE  10 MG: TAKE 4 TABS A DAY X 3 DAYS THEN 3 TABS A DAY X 4 DAYS THEN 2 TABS A DAY X 4 DAYS, THEN 1 TAB A DAY X 6 DAYS, THEN STOP. TAKE WITH FOOD 38 tablet 1   tadalafil  (CIALIS ) 20 MG tablet TAKE 1 TABLET BY MOUTH ONCE DAILY AS NEEDED FOR ERECTILE DYSFUNCTION 10 tablet 0   No facility-administered medications prior to visit.    ROS: Review of Systems  Constitutional:  Positive for fatigue. Negative for appetite change and unexpected weight change.  HENT:  Negative for congestion, nosebleeds,  sneezing, sore throat and trouble swallowing.   Eyes:  Negative for itching and visual disturbance.  Respiratory:  Negative for cough.   Cardiovascular:  Negative for chest pain, palpitations and leg swelling.  Gastrointestinal:  Negative for abdominal distention, blood in stool, diarrhea and nausea.  Genitourinary:  Negative for frequency and hematuria.  Musculoskeletal:  Positive for back pain and gait problem. Negative for joint swelling and neck pain.  Skin:  Negative for rash.  Neurological:  Negative for dizziness, tremors, speech difficulty and weakness.  Psychiatric/Behavioral:  Negative for agitation, dysphoric mood and sleep disturbance. The patient is not nervous/anxious.     Objective:  BP 130/84   Pulse 90   Temp 97.7 F (36.5 C)   Ht 6' (1.829 m)   Wt 133 lb (60.3 kg)   SpO2 95%   BMI 18.04 kg/m   BP Readings from Last 3 Encounters:  07/07/24 130/84  06/08/24 128/72  02/12/24 138/80    Wt Readings from Last 3 Encounters:  07/07/24 133 lb (60.3 kg)  06/08/24 133 lb 3.2 oz (60.4 kg)  06/02/24 138 lb (62.6 kg)    Physical Exam Constitutional:      General: He is not in acute distress.    Appearance: He is well-developed.     Comments: NAD  Eyes:  Conjunctiva/sclera: Conjunctivae normal.     Pupils: Pupils are equal, round, and reactive to light.  Neck:     Thyroid : No thyromegaly.     Vascular: No JVD.  Cardiovascular:     Rate and Rhythm: Normal rate and regular rhythm.     Heart sounds: Normal heart sounds. No murmur heard.    No friction rub. No gallop.  Pulmonary:     Effort: Pulmonary effort is normal. No respiratory distress.     Breath sounds: Normal breath sounds. No wheezing or rales.  Chest:     Chest wall: No tenderness.  Abdominal:     General: Bowel sounds are normal. There is no distension.     Palpations: Abdomen is soft. There is no mass.     Tenderness: There is no abdominal tenderness. There is no guarding or rebound.   Musculoskeletal:        General: Tenderness present. Normal range of motion.     Cervical back: Normal range of motion.     Right lower leg: No edema.     Left lower leg: No edema.  Lymphadenopathy:     Cervical: No cervical adenopathy.  Skin:    General: Skin is warm and dry.     Findings: No rash.  Neurological:     Mental Status: He is alert and oriented to person, place, and time.     Cranial Nerves: No cranial nerve deficit.     Motor: No abnormal muscle tone.     Coordination: Coordination normal.     Gait: Gait abnormal.     Deep Tendon Reflexes: Reflexes are normal and symmetric.  Psychiatric:        Behavior: Behavior normal.        Thought Content: Thought content normal.        Judgment: Judgment normal.   LS is stiff w/pain Using a cane  Lab Results  Component Value Date   WBC 9.1 11/04/2023   HGB 13.9 11/04/2023   HCT 41.5 11/04/2023   PLT 300.0 11/04/2023   GLUCOSE 115 (H) 11/04/2023   CHOL 183 12/02/2022   TRIG 125.0 12/02/2022   HDL 47.60 12/02/2022   LDLDIRECT 106.0 02/14/2020   LDLCALC 111 (H) 12/02/2022   ALT 14 11/04/2023   AST 15 11/04/2023   NA 137 11/04/2023   K 4.0 11/04/2023   CL 103 11/04/2023   CREATININE 0.96 11/04/2023   BUN 11 11/04/2023   CO2 26 11/04/2023   TSH 2.91 11/04/2023   PSA 6.68 (H) 11/04/2023   HGBA1C 7.0 (H) 07/09/2017    MR LUMBAR SPINE W WO CONTRAST Result Date: 01/20/2024 CLINICAL DATA:  Initial evaluation for lumbar radiculopathy, right-sided. History of prior fusion. EXAM: MRI LUMBAR SPINE WITHOUT AND WITH CONTRAST TECHNIQUE: Multiplanar and multiecho pulse sequences of the lumbar spine were obtained without and with intravenous contrast. CONTRAST:  6 mL of Vueway  COMPARISON:  Prior MRI from 11/28/2021 FINDINGS: Segmentation: Standard. Lowest well-formed disc space labeled the L5-S1 level. Alignment: Mild levoscoliosis. Grade 1 degenerative retrolisthesis of T12 on L1 through L2 on L3, with chronic grade 1  anterolisthesis of L4 on L5. Vertebrae: Postoperative changes from prior PLIF at L3-4 through L5-S1. Mild chronic height loss at the superior endplate of L3 with associated Schmorl's node deformity. Vertebral body height otherwise maintained with no acute or recent fracture. Bone marrow signal intensity within normal limits. No worrisome osseous lesions. Degenerative reactive endplate changes present about the L2-3 and L3-4 interspaces. No other abnormal  marrow edema or enhancement. Conus medullaris and cauda equina: Conus extends to the L1 level. Conus medullaris within normal limits. Again seen are multiple small nodular densities associated with the proximal nerve roots of the cauda equina, primarily at the L1 and L2 levels (series 107, images 7-9). Largest of these foci measures 5 mm at the level of L2 (series 107, image 9). 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 Paraspinal and other soft tissues: Chronic postoperative scarring within the posterior paraspinous soft tissues. No acute finding. Multiple T2 hyperintense cyst noted about the kidneys bilaterally, benign in appearance, no follow-up imaging recommended. Disc levels: T11-12: Trace anterolisthesis. Disc desiccation with mild disc bulge and reactive endplate spurring. Bilateral facet hypertrophy. No spinal stenosis. Moderate right with severe left foraminal stenosis. T12-L1: Retrolisthesis. Disc desiccation with diffuse disc bulge and reactive endplate spurring. Mild facet hypertrophy. No spinal stenosis. Moderate bilateral foraminal narrowing. L1-2: Retrolisthesis. Disc desiccation with diffuse disc bulge and reactive endplate spurring. Mild facet hypertrophy. No spinal stenosis. Moderate right with mild left foraminal stenosis. L2-3: Retrolisthesis. Disc desiccation with diffuse disc bulge and disc desiccation. Superimposed right foraminal disc extrusion with superior migration (series 104,  images 14, 12). Disc material closely approximates both the right L2 and descending L3 nerve roots, either which could be affected. Superimposed mild to moderate facet and ligament flavum hypertrophy with small joint effusions. Resultant moderate to severe canal with bilateral subarticular stenosis. Severe bilateral L2 foraminal narrowing. L3-4: Prior PLIF. No residual spinal stenosis. Moderate bilateral L3 foraminal narrowing related to endplate spurring and facet disease. L4-5: Advanced degenerative intervertebral disc space narrowing with bony ankylosis. Prior posterior decompression with fusion. No significant residual spinal stenosis. Moderate bilateral L4 foraminal narrowing related to endplate spurring and facet disease. L5-S1: Prior fusion and posterior decompression. No residual spinal stenosis. Mild left L5 foraminal narrowing. Right neural foramina remains patent. IMPRESSION: 1. Prior PLIF at L3-4 through L5-S1 without residual spinal stenosis. Moderate bilateral L3 and L4 foraminal narrowing related to endplate spurring and facet disease. 2. 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 Additional multifactorial degenerative changes at this level with resultant moderate to severe canal and bilateral subarticular stenosis. This is suspected to be the symptomatic level. 3. 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 Electronically Signed   By: Morene Hoard M.D.   On: 01/20/2024 18:42    Assessment & Plan:   Problem List Items Addressed This Visit     Gout   Steroids prn      Hypogonadism in male    Damoney wants to continue  Testosterone , but change to T gel. Potential benefits of a long term sex steroid  use as well as  potential risks (MI, elevation of PSA)  and complications were explained to the patient and were aknowledged. Monitor PSA, LFTs CBC      Lumbar spinal stenosis - Primary   S/p right  L2-3 microdiskectomy and laminectomy surgery in summer 2025 - Dr Darlin . States surgery is not going well at all. States he still has back pain in the same areas as before the surgery. He was working at [employer] - not back to work yet  Medrol  taper Start PT Norco prn RTC 6 weeks Eat better  Weight loss   Stable Diet discussed         Meds ordered this encounter  Medications   HYDROcodone -acetaminophen  (NORCO) 10-325 MG tablet    Sig: Take 1 tablet by mouth 3 (three) times daily as needed for severe pain (pain score 7-10). Code: M48.061    Dispense:  90 tablet    Refill:  0   Testosterone  10 MG/ACT (2%) GEL    Sig: Place 10 mg onto the skin in the morning.    Dispense:  60 g    Refill:  5   cyclobenzaprine (FLEXERIL) 10 MG tablet    Sig: Take 1 tablet (10 mg total) by mouth 3 (three) times daily as needed for muscle spasms.    Dispense:  30 tablet    Refill:  0   allopurinol  (ZYLOPRIM ) 300 MG tablet    Sig: Take 1 tablet (300 mg total) by mouth daily.    Dispense:  90 tablet    Refill:  2   amLODipine  (NORVASC ) 5 MG tablet    Sig: Take 1 tablet (5 mg total) by mouth daily.    Dispense:  90 tablet    Refill:  3   predniSONE  (DELTASONE ) 10 MG tablet    Sig: PREDNISONE  10 MG: TAKE 4 TABS A DAY X 3 DAYS THEN 3 TABS A DAY X 4 DAYS THEN 2 TABS A DAY X 4 DAYS, THEN 1 TAB A DAY X 6 DAYS, THEN STOP. TAKE WITH FOOD    Dispense:  38 tablet    Refill:  1   tadalafil  (CIALIS ) 20 MG tablet    Sig: Take 1 tablet (20 mg total) by mouth every three (3) days as needed for erectile dysfunction.    Dispense:  10 tablet    Refill:  2      Follow-up: Return in about 6 weeks (around 08/18/2024) for a follow-up visit.  Marolyn Noel, MD

## 2024-07-07 NOTE — Assessment & Plan Note (Signed)
Steroids prn 

## 2024-08-12 ENCOUNTER — Other Ambulatory Visit: Payer: Self-pay | Admitting: Internal Medicine

## 2024-08-12 MED ORDER — HYDROCODONE-ACETAMINOPHEN 10-325 MG PO TABS: 1.0000 | ORAL_TABLET | Freq: Three times a day (TID) | ORAL | 0 refills | Status: DC | PRN

## 2024-08-12 NOTE — Telephone Encounter (Unsigned)
 Copied from CRM #[id number]. Topic: Clinical - Medication Refill >> Aug 12, 2024 12:00 PM Sasha M wrote: Medication: HYDROcodone -acetaminophen  (NORCO) 10-325 MG tablet  Has the patient contacted their pharmacy? No (Agent: If no, request that the patient contact the pharmacy for the refill. If patient does not wish to contact the pharmacy document the reason why and proceed with request.) (Agent: If yes, when and what did the pharmacy advise?)  This is the patient's preferred pharmacy:  [id number] [place], [place] - [address] [place] [address] Phone: [phone] Fax: [phone]   Is this the correct pharmacy for this prescription? Yes If no, delete pharmacy and type the correct one.   Has the prescription been filled recently? No  Is the patient out of the medication? Yes  Has the patient been seen for an appointment in the last year OR does the patient have an upcoming appointment? Yes  Can we respond through MyChart? No, phone call preferred  Agent: Please be advised that Rx refills may take up to 3 business days. We ask that you follow-up with your pharmacy.

## 2024-08-19 ENCOUNTER — Encounter: Payer: Self-pay | Admitting: Internal Medicine

## 2024-08-19 ENCOUNTER — Ambulatory Visit (INDEPENDENT_AMBULATORY_CARE_PROVIDER_SITE_OTHER): Admitting: Internal Medicine

## 2024-08-19 VITALS — BP 136/84 | HR 99 | Ht 72.0 in | Wt 135.4 lb

## 2024-08-19 DIAGNOSIS — E559 Vitamin D deficiency, unspecified: Secondary | ICD-10-CM

## 2024-08-19 DIAGNOSIS — K219 Gastro-esophageal reflux disease without esophagitis: Secondary | ICD-10-CM

## 2024-08-19 DIAGNOSIS — R972 Elevated prostate specific antigen [PSA]: Secondary | ICD-10-CM | POA: Diagnosis not present

## 2024-08-19 DIAGNOSIS — N320 Bladder-neck obstruction: Secondary | ICD-10-CM

## 2024-08-19 DIAGNOSIS — M48061 Spinal stenosis, lumbar region without neurogenic claudication: Secondary | ICD-10-CM

## 2024-08-19 DIAGNOSIS — I10 Essential (primary) hypertension: Secondary | ICD-10-CM

## 2024-08-19 DIAGNOSIS — R634 Abnormal weight loss: Secondary | ICD-10-CM

## 2024-08-19 MED ORDER — HYDROCODONE-ACETAMINOPHEN 10-325 MG PO TABS: 1.0000 | ORAL_TABLET | Freq: Three times a day (TID) | ORAL | 0 refills | Status: AC | PRN

## 2024-08-19 MED ORDER — METHOCARBAMOL 500 MG PO TABS: 750.0000 mg | ORAL_TABLET | Freq: Four times a day (QID) | ORAL | 3 refills | Status: AC | PRN

## 2024-08-19 MED ORDER — TESTOSTERONE 10 MG/ACT (2%) TD GEL: 10.0000 mg | Freq: Every morning | TRANSDERMAL | 5 refills | Status: AC

## 2024-08-19 MED ORDER — AMLODIPINE BESYLATE 5 MG PO TABS: 5.0000 mg | ORAL_TABLET | Freq: Every day | ORAL | 3 refills | Status: AC

## 2024-08-19 NOTE — Assessment & Plan Note (Signed)
Chronic Cont on Amlodipine

## 2024-08-19 NOTE — Progress Notes (Signed)
 Subjective:  Patient ID: Brent Turner, male    DOB: [birth year]  Age: 83 y.o. MRN: [id number]  CC: Medical Management of Chronic Issues (6 Week follow up)   HPI Brent Turner presents for LBP and radiculitis, spasms   Outpatient Medications Prior to Visit  Medication Sig Dispense Refill   allopurinol  (ZYLOPRIM ) 300 MG tablet Take 1 tablet (300 mg total) by mouth daily. 90 tablet 2   Cholecalciferol  1000 UNITS tablet Take 1,000 Units by mouth daily.     cyclobenzaprine  (FLEXERIL ) 10 MG tablet Take 1 tablet (10 mg total) by mouth 3 (three) times daily as needed for muscle spasms. 30 tablet 0   feeding supplement (ENSURE ENLIVE / ENSURE PLUS) LIQD Take 237 mLs by mouth 2 (two) times daily between meals. 60 mL 2   pantoprazole  (PROTONIX ) 40 MG tablet Take 1 tablet (40 mg total) by mouth daily. 90 tablet 3   predniSONE  (DELTASONE ) 10 MG tablet PREDNISONE  10 MG: TAKE 4 TABS A DAY X 3 DAYS THEN 3 TABS A DAY X 4 DAYS THEN 2 TABS A DAY X 4 DAYS, THEN 1 TAB A DAY X 6 DAYS, THEN STOP. TAKE WITH FOOD 38 tablet 1   SYRINGE-NEEDLE, DISP, 3 ML (BD ECLIPSE SYRINGE/NEEDLE) 23G X 1 3 ML MISC As directed 50 each 0   tadalafil  (CIALIS ) 20 MG tablet Take 1 tablet (20 mg total) by mouth every three (3) days as needed for erectile dysfunction. 10 tablet 2   tamsulosin  (FLOMAX ) 0.4 MG CAPS capsule TAKE 1 CAPSULE BY MOUTH DAILY *NEED APPOINTMENT WITH LABS FOR REFILLS* 90 capsule 3   tizanidine  (ZANAFLEX ) 2 MG capsule TAKE 1 CAPSULE BY MOUTH 3 TIMES DAILY. 60 capsule 1   amLODipine  (NORVASC ) 5 MG tablet Take 1 tablet (5 mg total) by mouth daily. 90 tablet 3   HYDROcodone -acetaminophen  (NORCO) 10-325 MG tablet Take 1 tablet by mouth 3 (three) times daily as needed for severe pain (pain score 7-10). Code: M48.061 90 tablet 0   methocarbamol  (ROBAXIN ) 500 MG tablet Take 1.5 tablets (750 mg total) by mouth every 6 (six) hours as needed for muscle spasms. 90 tablet 3   Testosterone  10 MG/ACT (2%) GEL Place 10 mg onto  the skin in the morning. 60 g 5   No facility-administered medications prior to visit.    ROS: Review of Systems  Constitutional:  Positive for fatigue. Negative for appetite change and unexpected weight change.  HENT:  Negative for congestion, nosebleeds, sneezing, sore throat and trouble swallowing.   Eyes:  Negative for itching and visual disturbance.  Respiratory:  Negative for cough.   Cardiovascular:  Negative for chest pain, palpitations and leg swelling.  Gastrointestinal:  Negative for abdominal distention, blood in stool, diarrhea and nausea.  Genitourinary:  Negative for frequency and hematuria.  Musculoskeletal:  Positive for arthralgias, back pain and gait problem. Negative for joint swelling and neck pain.  Skin:  Negative for rash.  Neurological:  Negative for dizziness, tremors, speech difficulty and weakness.  Psychiatric/Behavioral:  Negative for agitation, dysphoric mood and sleep disturbance. The patient is not nervous/anxious.     Objective:  BP 136/84   Pulse 99   Ht 6' (1.829 m)   Wt 135 lb 6.4 oz (61.4 kg)   SpO2 97%   BMI 18.36 kg/m   BP Readings from Last 3 Encounters:  08/19/24 136/84  07/07/24 130/84  06/08/24 128/72    Wt Readings from Last 3 Encounters:  08/19/24 135 lb 6.4  oz (61.4 kg)  07/07/24 133 lb (60.3 kg)  06/08/24 133 lb 3.2 oz (60.4 kg)    Physical Exam Constitutional:      General: He is not in acute distress.    Appearance: Normal appearance. He is well-developed.     Comments: NAD  Eyes:     Conjunctiva/sclera: Conjunctivae normal.     Pupils: Pupils are equal, round, and reactive to light.  Neck:     Thyroid : No thyromegaly.     Vascular: No JVD.  Cardiovascular:     Rate and Rhythm: Normal rate and regular rhythm.     Heart sounds: Normal heart sounds. No murmur heard.    No friction rub. No gallop.  Pulmonary:     Effort: Pulmonary effort is normal. No respiratory distress.     Breath sounds: Normal breath sounds.  No wheezing or rales.  Chest:     Chest wall: No tenderness.  Abdominal:     General: Bowel sounds are normal. There is no distension.     Palpations: Abdomen is soft. There is no mass.     Tenderness: There is no abdominal tenderness. There is no guarding or rebound.  Musculoskeletal:        General: No tenderness. Normal range of motion.     Cervical back: Normal range of motion.     Right lower leg: No edema.     Left lower leg: No edema.  Lymphadenopathy:     Cervical: No cervical adenopathy.  Skin:    General: Skin is warm and dry.     Findings: No rash.  Neurological:     Mental Status: He is alert and oriented to person, place, and time.     Cranial Nerves: No cranial nerve deficit.     Motor: No abnormal muscle tone.     Coordination: Coordination normal.     Gait: Gait normal.     Deep Tendon Reflexes: Reflexes are normal and symmetric.  Psychiatric:        Behavior: Behavior normal.        Thought Content: Thought content normal.        Judgment: Judgment normal.   Stooped, LS w/pain  Lab Results  Component Value Date   WBC 9.1 11/04/2023   HGB 13.9 11/04/2023   HCT 41.5 11/04/2023   PLT 300.0 11/04/2023   GLUCOSE 115 (H) 11/04/2023   CHOL 183 12/02/2022   TRIG 125.0 12/02/2022   HDL 47.60 12/02/2022   LDLDIRECT 106.0 02/14/2020   LDLCALC 111 (H) 12/02/2022   ALT 14 11/04/2023   AST 15 11/04/2023   NA 137 11/04/2023   K 4.0 11/04/2023   CL 103 11/04/2023   CREATININE 0.96 11/04/2023   BUN 11 11/04/2023   CO2 26 11/04/2023   TSH 2.91 11/04/2023   PSA 6.68 (H) 11/04/2023   HGBA1C 7.0 (H) 07/09/2017    MR LUMBAR SPINE W WO CONTRAST Result Date: 01/20/2024 CLINICAL DATA:  Initial evaluation for lumbar radiculopathy, right-sided. History of prior fusion. EXAM: MRI LUMBAR SPINE WITHOUT AND WITH CONTRAST TECHNIQUE: Multiplanar and multiecho pulse sequences of the lumbar spine were obtained without and with intravenous contrast. CONTRAST:  6 mL of Vueway   COMPARISON:  Prior MRI from 11/28/2021 FINDINGS: Segmentation: Standard. Lowest well-formed disc space labeled the L5-S1 level. Alignment: Mild levoscoliosis. Grade 1 degenerative retrolisthesis of T12 on L1 through L2 on L3, with chronic grade 1 anterolisthesis of L4 on L5. Vertebrae: Postoperative changes from prior PLIF at L3-4 through  L5-S1. Mild chronic height loss at the superior endplate of L3 with associated Schmorl's node deformity. Vertebral body height otherwise maintained with no acute or recent fracture. Bone marrow signal intensity within normal limits. No worrisome osseous lesions. Degenerative reactive endplate changes present about the L2-3 and L3-4 interspaces. No other abnormal marrow edema or enhancement. Conus medullaris and cauda equina: Conus extends to the L1 level. Conus medullaris within normal limits. Again seen are multiple small nodular densities associated with the proximal nerve roots of the cauda equina, primarily at the L1 and L2 levels (series 107, images 7-9). Largest of these foci measures 5 mm at the level of L2 (series 107, image 9). 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 Paraspinal and other soft tissues: Chronic postoperative scarring within the posterior paraspinous soft tissues. No acute finding. Multiple T2 hyperintense cyst noted about the kidneys bilaterally, benign in appearance, no follow-up imaging recommended. Disc levels: T11-12: Trace anterolisthesis. Disc desiccation with mild disc bulge and reactive endplate spurring. Bilateral facet hypertrophy. No spinal stenosis. Moderate right with severe left foraminal stenosis. T12-L1: Retrolisthesis. Disc desiccation with diffuse disc bulge and reactive endplate spurring. Mild facet hypertrophy. No spinal stenosis. Moderate bilateral foraminal narrowing. L1-2: Retrolisthesis. Disc desiccation with diffuse disc bulge and reactive endplate spurring. Mild facet  hypertrophy. No spinal stenosis. Moderate right with mild left foraminal stenosis. L2-3: Retrolisthesis. Disc desiccation with diffuse disc bulge and disc desiccation. Superimposed right foraminal disc extrusion with superior migration (series 104, images 14, 12). Disc material closely approximates both the right L2 and descending L3 nerve roots, either which could be affected. Superimposed mild to moderate facet and ligament flavum hypertrophy with small joint effusions. Resultant moderate to severe canal with bilateral subarticular stenosis. Severe bilateral L2 foraminal narrowing. L3-4: Prior PLIF. No residual spinal stenosis. Moderate bilateral L3 foraminal narrowing related to endplate spurring and facet disease. L4-5: Advanced degenerative intervertebral disc space narrowing with bony ankylosis. Prior posterior decompression with fusion. No significant residual spinal stenosis. Moderate bilateral L4 foraminal narrowing related to endplate spurring and facet disease. L5-S1: Prior fusion and posterior decompression. No residual spinal stenosis. Mild left L5 foraminal narrowing. Right neural foramina remains patent. IMPRESSION: 1. Prior PLIF at L3-4 through L5-S1 without residual spinal stenosis. Moderate bilateral L3 and L4 foraminal narrowing related to endplate spurring and facet disease. 2. 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 Additional multifactorial degenerative changes at this level with resultant moderate to severe canal and bilateral subarticular stenosis. This is suspected to be the symptomatic level. 3. 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 Electronically Signed   By: Morene Hoard M.D.   On: 01/20/2024 18:42     Assessment & Plan:   Problem List Items Addressed This Visit     Bladder neck obstruction   Flomax , Cialis  5 mg      Elevated PSA   Check PSA      Essential hypertension   Chronic Cont on Amlodipine       Relevant Medications   amLODipine  (NORVASC ) 5 MG tablet   GERD (gastroesophageal reflux disease)   New Protonix  qd      Lumbar spinal stenosis - Primary   Vitamin D  deficiency   On Vit D  Weight loss   No wt loss         Meds ordered this encounter  Medications   amLODipine  (NORVASC ) 5 MG tablet    Sig: Take 1 tablet (5 mg total) by mouth daily.    Dispense:  90 tablet    Refill:  3   HYDROcodone -acetaminophen  (NORCO) 10-325 MG tablet    Sig: Take 1 tablet by mouth 3 (three) times daily as needed for severe pain (pain score 7-10).    Dispense:  90 tablet    Refill:  0    Code: M48.061   methocarbamol  (ROBAXIN ) 500 MG tablet    Sig: Take 1.5 tablets (750 mg total) by mouth every 6 (six) hours as needed for muscle spasms.    Dispense:  90 tablet    Refill:  3   Testosterone  10 MG/ACT (2%) GEL    Sig: Place 10 mg onto the skin in the morning.    Dispense:  60 g    Refill:  5      Follow-up: Return in about 2 months (around 10/20/2024) for a follow-up visit.  Marolyn Noel, MD

## 2024-08-19 NOTE — Assessment & Plan Note (Signed)
 Check PSA. ?

## 2024-08-19 NOTE — Assessment & Plan Note (Signed)
 New Protonix qd

## 2024-08-19 NOTE — Assessment & Plan Note (Signed)
No wt loss 

## 2024-08-19 NOTE — Assessment & Plan Note (Signed)
 Flomax , Cialis  5 mg

## 2024-08-19 NOTE — Patient Instructions (Signed)

## 2024-08-19 NOTE — Assessment & Plan Note (Signed)
 On Vit D

## 2024-09-21 ENCOUNTER — Other Ambulatory Visit: Payer: Self-pay | Admitting: Neurosurgery

## 2024-09-21 DIAGNOSIS — M5126 Other intervertebral disc displacement, lumbar region: Secondary | ICD-10-CM

## 2024-09-22 ENCOUNTER — Inpatient Hospital Stay: Admission: RE | Admit: 2024-09-22 | Discharge: 2024-09-22 | Attending: Neurosurgery

## 2024-09-22 DIAGNOSIS — M5126 Other intervertebral disc displacement, lumbar region: Secondary | ICD-10-CM

## 2024-10-20 ENCOUNTER — Ambulatory Visit: Admitting: Internal Medicine

## 2025-06-03 ENCOUNTER — Ambulatory Visit
